# Patient Record
Sex: Male | Born: 1935
Health system: Southern US, Community
[De-identification: ages and names within clinical notes are randomized; demographics above are authoritative.]

## PROBLEM LIST (undated history)

## (undated) DIAGNOSIS — M1990 Unspecified osteoarthritis, unspecified site: Secondary | ICD-10-CM

## (undated) DIAGNOSIS — I2510 Atherosclerotic heart disease of native coronary artery without angina pectoris: Secondary | ICD-10-CM

## (undated) DIAGNOSIS — E785 Hyperlipidemia, unspecified: Secondary | ICD-10-CM

## (undated) DIAGNOSIS — H269 Unspecified cataract: Secondary | ICD-10-CM

## (undated) DIAGNOSIS — E041 Nontoxic single thyroid nodule: Secondary | ICD-10-CM

## (undated) DIAGNOSIS — K219 Gastro-esophageal reflux disease without esophagitis: Secondary | ICD-10-CM

## (undated) DIAGNOSIS — C61 Malignant neoplasm of prostate: Secondary | ICD-10-CM

## (undated) DIAGNOSIS — D649 Anemia, unspecified: Secondary | ICD-10-CM

## (undated) DIAGNOSIS — N289 Disorder of kidney and ureter, unspecified: Secondary | ICD-10-CM

## (undated) DIAGNOSIS — D519 Vitamin B12 deficiency anemia, unspecified: Secondary | ICD-10-CM

## (undated) HISTORY — DX: Atherosclerotic heart disease of native coronary artery without angina pectoris: I25.10

## (undated) HISTORY — DX: Unspecified cataract: H26.9

## (undated) HISTORY — DX: Vitamin B12 deficiency anemia, unspecified: D51.9

## (undated) HISTORY — DX: Hyperlipidemia, unspecified: E78.5

## (undated) HISTORY — DX: Unspecified osteoarthritis, unspecified site: M19.90

## (undated) HISTORY — DX: Malignant neoplasm of prostate: C61

## (undated) HISTORY — DX: Gastro-esophageal reflux disease without esophagitis: K21.9

## (undated) HISTORY — DX: Anemia, unspecified: D64.9

## (undated) HISTORY — PX: PROSTATE BIOPSY: SHX241

## (undated) HISTORY — DX: Nontoxic single thyroid nodule: E04.1

## (undated) HISTORY — DX: Disorder of kidney and ureter, unspecified: N28.9

---

## 2006-01-02 ENCOUNTER — Ambulatory Visit: Payer: Self-pay | Admitting: Urology

## 2006-01-05 ENCOUNTER — Ambulatory Visit: Payer: Self-pay | Admitting: Urology

## 2006-08-08 ENCOUNTER — Emergency Department: Payer: Self-pay | Admitting: Emergency Medicine

## 2011-07-23 ENCOUNTER — Emergency Department: Payer: Self-pay | Admitting: Emergency Medicine

## 2011-09-14 ENCOUNTER — Ambulatory Visit: Payer: Self-pay | Admitting: Internal Medicine

## 2014-04-08 ENCOUNTER — Ambulatory Visit: Payer: Self-pay | Admitting: Urology

## 2014-09-18 DIAGNOSIS — D518 Other vitamin B12 deficiency anemias: Secondary | ICD-10-CM | POA: Diagnosis not present

## 2014-09-18 DIAGNOSIS — H9193 Unspecified hearing loss, bilateral: Secondary | ICD-10-CM | POA: Diagnosis not present

## 2014-09-18 DIAGNOSIS — E782 Mixed hyperlipidemia: Secondary | ICD-10-CM | POA: Diagnosis not present

## 2014-09-18 DIAGNOSIS — C61 Malignant neoplasm of prostate: Secondary | ICD-10-CM | POA: Diagnosis not present

## 2014-09-18 DIAGNOSIS — J449 Chronic obstructive pulmonary disease, unspecified: Secondary | ICD-10-CM | POA: Diagnosis not present

## 2014-10-08 DIAGNOSIS — H40003 Preglaucoma, unspecified, bilateral: Secondary | ICD-10-CM | POA: Diagnosis not present

## 2014-10-25 ENCOUNTER — Emergency Department: Payer: Self-pay | Admitting: Emergency Medicine

## 2014-10-25 DIAGNOSIS — Z87891 Personal history of nicotine dependence: Secondary | ICD-10-CM | POA: Diagnosis not present

## 2014-10-25 DIAGNOSIS — R05 Cough: Secondary | ICD-10-CM | POA: Diagnosis not present

## 2014-10-25 DIAGNOSIS — J189 Pneumonia, unspecified organism: Secondary | ICD-10-CM | POA: Diagnosis not present

## 2014-11-02 ENCOUNTER — Ambulatory Visit: Payer: Self-pay | Admitting: Physician Assistant

## 2014-11-02 DIAGNOSIS — D518 Other vitamin B12 deficiency anemias: Secondary | ICD-10-CM | POA: Diagnosis not present

## 2014-11-02 DIAGNOSIS — D519 Vitamin B12 deficiency anemia, unspecified: Secondary | ICD-10-CM | POA: Diagnosis not present

## 2014-11-02 DIAGNOSIS — C61 Malignant neoplasm of prostate: Secondary | ICD-10-CM | POA: Diagnosis not present

## 2014-11-02 DIAGNOSIS — J449 Chronic obstructive pulmonary disease, unspecified: Secondary | ICD-10-CM | POA: Diagnosis not present

## 2014-11-02 DIAGNOSIS — R0602 Shortness of breath: Secondary | ICD-10-CM | POA: Diagnosis not present

## 2014-11-02 DIAGNOSIS — I959 Hypotension, unspecified: Secondary | ICD-10-CM | POA: Diagnosis not present

## 2014-11-02 DIAGNOSIS — J189 Pneumonia, unspecified organism: Secondary | ICD-10-CM | POA: Diagnosis not present

## 2014-11-04 ENCOUNTER — Emergency Department: Payer: Self-pay | Admitting: Emergency Medicine

## 2014-11-04 DIAGNOSIS — G8929 Other chronic pain: Secondary | ICD-10-CM | POA: Diagnosis not present

## 2014-11-04 DIAGNOSIS — R531 Weakness: Secondary | ICD-10-CM | POA: Diagnosis not present

## 2014-11-04 DIAGNOSIS — M549 Dorsalgia, unspecified: Secondary | ICD-10-CM | POA: Diagnosis not present

## 2014-11-04 DIAGNOSIS — Z87891 Personal history of nicotine dependence: Secondary | ICD-10-CM | POA: Diagnosis not present

## 2014-11-10 DIAGNOSIS — F329 Major depressive disorder, single episode, unspecified: Secondary | ICD-10-CM | POA: Diagnosis not present

## 2014-11-10 DIAGNOSIS — J189 Pneumonia, unspecified organism: Secondary | ICD-10-CM | POA: Diagnosis not present

## 2014-11-10 DIAGNOSIS — D519 Vitamin B12 deficiency anemia, unspecified: Secondary | ICD-10-CM | POA: Diagnosis not present

## 2014-11-10 DIAGNOSIS — N179 Acute kidney failure, unspecified: Secondary | ICD-10-CM | POA: Diagnosis not present

## 2014-11-10 DIAGNOSIS — I959 Hypotension, unspecified: Secondary | ICD-10-CM | POA: Diagnosis not present

## 2014-11-12 DIAGNOSIS — I959 Hypotension, unspecified: Secondary | ICD-10-CM | POA: Diagnosis not present

## 2014-11-12 DIAGNOSIS — D518 Other vitamin B12 deficiency anemias: Secondary | ICD-10-CM | POA: Diagnosis not present

## 2014-11-12 DIAGNOSIS — J189 Pneumonia, unspecified organism: Secondary | ICD-10-CM | POA: Diagnosis not present

## 2014-11-12 DIAGNOSIS — N179 Acute kidney failure, unspecified: Secondary | ICD-10-CM | POA: Diagnosis not present

## 2014-11-30 ENCOUNTER — Ambulatory Visit
Admit: 2014-11-30 | Disposition: A | Discharge status: Home / Self Care | Payer: Self-pay | Attending: Physician Assistant | Admitting: Physician Assistant

## 2014-11-30 DIAGNOSIS — Z8701 Personal history of pneumonia (recurrent): Secondary | ICD-10-CM | POA: Diagnosis not present

## 2014-11-30 DIAGNOSIS — R0602 Shortness of breath: Secondary | ICD-10-CM | POA: Diagnosis not present

## 2014-12-02 DIAGNOSIS — C61 Malignant neoplasm of prostate: Secondary | ICD-10-CM | POA: Diagnosis not present

## 2014-12-17 DIAGNOSIS — R972 Elevated prostate specific antigen [PSA]: Secondary | ICD-10-CM | POA: Diagnosis not present

## 2014-12-24 ENCOUNTER — Ambulatory Visit: Payer: Medicare Other

## 2014-12-24 ENCOUNTER — Encounter: Payer: Self-pay | Admitting: Hematology and Oncology

## 2014-12-24 ENCOUNTER — Inpatient Hospital Stay: Payer: Medicare Other | Attending: Hematology and Oncology | Admitting: Hematology and Oncology

## 2014-12-24 VITALS — BP 159/79 | HR 70 | Temp 97.4°F | Resp 18 | Ht 71.0 in | Wt 166.4 lb

## 2014-12-24 DIAGNOSIS — Z79818 Long term (current) use of other agents affecting estrogen receptors and estrogen levels: Secondary | ICD-10-CM

## 2014-12-24 DIAGNOSIS — R61 Generalized hyperhidrosis: Secondary | ICD-10-CM | POA: Diagnosis not present

## 2014-12-24 DIAGNOSIS — C61 Malignant neoplasm of prostate: Secondary | ICD-10-CM | POA: Diagnosis not present

## 2014-12-24 DIAGNOSIS — K219 Gastro-esophageal reflux disease without esophagitis: Secondary | ICD-10-CM

## 2014-12-24 DIAGNOSIS — M1990 Unspecified osteoarthritis, unspecified site: Secondary | ICD-10-CM | POA: Diagnosis not present

## 2014-12-24 DIAGNOSIS — R972 Elevated prostate specific antigen [PSA]: Secondary | ICD-10-CM | POA: Diagnosis not present

## 2014-12-24 DIAGNOSIS — R634 Abnormal weight loss: Secondary | ICD-10-CM | POA: Insufficient documentation

## 2014-12-24 DIAGNOSIS — Z87891 Personal history of nicotine dependence: Secondary | ICD-10-CM

## 2014-12-24 DIAGNOSIS — D649 Anemia, unspecified: Secondary | ICD-10-CM

## 2014-12-24 DIAGNOSIS — Z8051 Family history of malignant neoplasm of kidney: Secondary | ICD-10-CM

## 2014-12-24 HISTORY — DX: Anemia, unspecified: D64.9

## 2014-12-24 LAB — COMPREHENSIVE METABOLIC PANEL
ALT: 6 U/L — ABNORMAL LOW (ref 17–63)
AST: 14 U/L — ABNORMAL LOW (ref 15–41)
Albumin: 4 g/dL (ref 3.5–5.0)
Alkaline Phosphatase: 63 U/L (ref 38–126)
Anion gap: 8 (ref 5–15)
BUN: 13 mg/dL (ref 6–20)
CO2: 26 mmol/L (ref 22–32)
Calcium: 9.3 mg/dL (ref 8.9–10.3)
Chloride: 105 mmol/L (ref 101–111)
Creatinine, Ser: 0.96 mg/dL (ref 0.61–1.24)
GFR calc Af Amer: >60 mL/min (ref >60)
GFR calc non Af Amer: >60 mL/min (ref >60)
Glucose, Bld: 92 mg/dL (ref 65–99)
Potassium: 3.9 mmol/L (ref 3.5–5.1)
Sodium: 139 mmol/L (ref 135–145)
Total Bilirubin: 0.5 mg/dL (ref 0.3–1.2)
Total Protein: 7.2 g/dL (ref 6.5–8.1)

## 2014-12-24 LAB — IRON AND TIBC
Iron: 64 ug/dL (ref 45–182)
Saturation Ratios: 22 % (ref 17.9–39.5)
TIBC: 293 ug/dL (ref 250–450)
UIBC: 229 ug/dL

## 2014-12-24 LAB — CBC WITH DIFFERENTIAL/PLATELET
Basophils Absolute: 0.1 10*3/uL (ref 0–0.1)
Basophils Relative: 2 %
Eosinophils Absolute: 0.5 10*3/uL (ref 0–0.7)
Eosinophils Relative: 12 %
HCT: 36.1 % — ABNORMAL LOW (ref 40.0–52.0)
Hemoglobin: 12 g/dL — ABNORMAL LOW (ref 13.0–18.0)
Lymphocytes Relative: 32 %
Lymphs Abs: 1.2 10*3/uL (ref 1.0–3.6)
MCH: 31.9 pg (ref 26.0–34.0)
MCHC: 33.3 g/dL (ref 32.0–36.0)
MCV: 95.7 fL (ref 80.0–100.0)
Monocytes Absolute: 0.5 10*3/uL (ref 0.2–1.0)
Monocytes Relative: 13 %
Neutro Abs: 1.6 10*3/uL (ref 1.4–6.5)
Neutrophils Relative %: 41 %
Platelets: 192 10*3/uL (ref 150–440)
RBC: 3.77 MIL/uL — ABNORMAL LOW (ref 4.40–5.90)
RDW: 14.8 % — ABNORMAL HIGH (ref 11.5–14.5)
WBC: 3.8 10*3/uL (ref 3.8–10.6)

## 2014-12-24 LAB — TSH: TSH: 2.487 u[IU]/mL (ref 0.350–4.500)

## 2014-12-24 LAB — FOLATE: Folate: 11 ng/mL (ref >5.9)

## 2014-12-24 LAB — FERRITIN: Ferritin: 158 ng/mL (ref 24–336)

## 2014-12-24 NOTE — Progress Notes (Signed)
[facility] day:  12/24/2014  Chief Complaint: Charles Flores is an 81 y.o. male with prostate cancer who is referred in consultation by Dr. Rick Duff of [place].  HPI: The patient states that his prostate cancer was discovered approximately 20 years by an elevated PSA.  He underwent biopsy at [facility] (no report available).   He states that an "implant" was placed.  His PSA came down.  Approximately 4-5 years ago, his PS started going up again.    He states that he has seen Dr. Radford Pax 4-5 times.  Reports indicate T4N0 disease.  PSA was 774 on first presentation.  He was started on Lupron.  He receives an injection every 6 months (last 12/02/2014).   He denies any side effects.  He states that "it isn't working anymore".  PSA was 127.6 on 06/30/2014 and 168.3 on 12/02/2014.  Last bone scan on 04/08/2014 revealed no evidence of metastatic disease.  He denies any bone pain.  He denies any urinary symptoms.  He has a history of anemia.  He states that his diet is good.  He eats meat 2 times a week.  He denies any melena or hematochezia.  He denies any hematuria.  Last colonoscopy was more than 10 years ago.  Past Medical History  Diagnosis Date  . Prostate cancer   . Arthritis   . Cataract   . GERD (gastroesophageal reflux disease)   . Anemia 12/24/2014    History reviewed. No pertinent past surgical history.  Family History  Problem Relation Age of Onset  . Cancer Mother   . Breast cancer Mother   . Kidney disease Father   . Diabetes Sister     Social History:  reports that he has quit smoking. His smoking use included Cigarettes. He has a 7.5 pack-year smoking history. He has never used smokeless tobacco. He reports that he does not drink alcohol or use illicit drugs.  Allergies: No Known Allergies  No current outpatient prescriptions on file.   No current facility-administered medications  for this visit.   ROS  GENERAL:  Feels good.  Active.  No fevers.  Some sweats at night.  Lost 20# in a year. PERFORMANCE STATUS (ECOG):  1 HEENT:  No visual changes, runny nose, sore throat, mouth sores or tenderness. Lungs: No shortness of breath or cough.  No hemoptysis. Cardiac:  No chest pain, palpitations, orthopnea, or PND. GI:  No nausea, vomiting, diarrhea, constipation, melena or hematochezia. GU:  No urgency, frequency, dysuria, or hematuria. Musculoskeletal:  No back pain.  No joint pain.  No muscle tenderness. Extremities:  No pain or swelling. Skin:  No rashes or skin changes. Neuro:  No headache, numbness or weakness.  Sometimes balance or coordination issues. Endocrine:  No diabetes, thyroid issues, hot flashes or night sweats. Psych:  No mood changes, depression or anxiety. Pain:  No focal pain. Review of systems:  All other systems reviewed and found to be negative.  Physical Exam  Blood pressure 159/79, pulse 70, temperature 97.4 F (36.3 C), temperature source Oral, resp. rate 18, height '5\' 11"'  (1.803 m), weight 166 lb 7.2 oz (75.5 kg), SpO2 100 %.  GENERAL:  Well developed, well nourished gentleman sitting comfortably in the exam room in no acute distress. MENTAL STATUS:  Alert and oriented to person, place and time. HEAD:  Wearing a gray hat.  Alopecia.  Normocephalic, atraumatic, face symmetric, no Cushingoid features. EYES:  Brown eyes (prominent).  Pupils equal round and reactive to light and accomodation.  No conjunctivitis or scleral icterus. ENT:  Oropharynx clear without lesion.  Tongue normal. Mucous membranes moist. Dentures. RESPIRATORY:  Clear to auscultation without rales, wheezes or rhonchi. CARDIOVASCULAR:  Regular rate and rhythm without murmur, rub or gallop. ABDOMEN:  Soft, non-tender, with active bowel sounds, and no hepatosplenomegaly.  No masses. BACK:  No tenderness on percussion of the back or rib cage. SKIN:  No rashes, ulcers or  lesions. EXTREMITIES: Trace lower extremity edema.  No skin discoloration or tenderness.  No palpable cords. LYMPH NODES: No palpable cervical, supraclavicular, axillary or inguinal adenopathy  NEUROLOGICAL: Unremarkable. PSYCH:  Appropriate.  Appointment on 12/24/2014  Component Date Value Ref Range Status  . WBC 12/24/2014 3.8  3.8 - 10.6 K/uL Final  . RBC 12/24/2014 3.77* 4.40 - 5.90 MIL/uL Final  . Hemoglobin 12/24/2014 12.0* 13.0 - 18.0 g/dL Final  . HCT 12/24/2014 36.1* 40.0 - 52.0 % Final  . MCV 12/24/2014 95.7  80.0 - 100.0 fL Final  . MCH 12/24/2014 31.9  26.0 - 34.0 pg Final  . MCHC 12/24/2014 33.3  32.0 - 36.0 g/dL Final  . RDW 12/24/2014 14.8* 11.5 - 14.5 % Final  . Platelets 12/24/2014 192  150 - 440 K/uL Final  . Neutrophils Relative % 12/24/2014 41   Final  . Neutro Abs 12/24/2014 1.6  1.4 - 6.5 K/uL Final  . Lymphocytes Relative 12/24/2014 32   Final  . Lymphs Abs 12/24/2014 1.2  1.0 - 3.6 K/uL Final  . Monocytes Relative 12/24/2014 13   Final  . Monocytes Absolute 12/24/2014 0.5  0.2 - 1.0 K/uL Final  . Eosinophils Relative 12/24/2014 12   Final  . Eosinophils Absolute 12/24/2014 0.5  0 - 0.7 K/uL Final  . Basophils Relative 12/24/2014 2   Final  . Basophils Absolute 12/24/2014 0.1  0 - 0.1 K/uL Final  . Sodium 12/24/2014 139  135 - 145 mmol/L Final  . Potassium 12/24/2014 3.9  3.5 - 5.1 mmol/L Final  . Chloride 12/24/2014 105  101 - 111 mmol/L Final  . CO2 12/24/2014 26  22 - 32 mmol/L Final  . Glucose, Bld 12/24/2014 92  65 - 99 mg/dL Final  . BUN 12/24/2014 13  6 - 20 mg/dL Final  . Creatinine, Ser 12/24/2014 0.96  0.61 - 1.24 mg/dL Final  . Calcium 12/24/2014 9.3  8.9 - 10.3 mg/dL Final  . Total Protein 12/24/2014 7.2  6.5 - 8.1 g/dL Final  . Albumin 12/24/2014 4.0  3.5 - 5.0 g/dL Final  . AST 12/24/2014 14* 15 - 41 U/L Final  . ALT 12/24/2014 6* 17 - 63 U/L Final  . Alkaline Phosphatase 12/24/2014 63  38 - 126 U/L Final  . Total Bilirubin 12/24/2014 0.5   0.3 - 1.2 mg/dL Final  . GFR calc non Af Amer 12/24/2014 >60  >60 mL/min Final  . GFR calc Af Amer 12/24/2014 >60  >60 mL/min Final   Comment: (NOTE) The eGFR has been calculated using the CKD EPI equation. This calculation has not been validated in all clinical situations. eGFR's persistently <60 mL/min signify possible Chronic Kidney Disease.   . Anion gap 12/24/2014 8  5 - 15 Final  . Ferritin 12/24/2014 158  24 - 336 ng/mL Final  . Iron 12/24/2014 64  45 - 182 ug/dL Final  . TIBC 12/24/2014 293  250 - 450 ug/dL Final  . Saturation Ratios 12/24/2014 22  17.9 -  39.5 % Final  . UIBC 12/24/2014 229   Final  . Folate 12/24/2014 11.0  >5.9 ng/mL Final  . TSH 12/24/2014 2.487  0.350 - 4.500 uIU/mL Final    Assessment:  The patient is a 81 year old African American gentleman with prostate cancer and a rising PSA on Lupron.  Prostate cancer was discovered approximately 20 years secondary to an elevated PSA.  He underwent biopsy at [facility] (no report available).     Initial PSA was 774.  PSA was 127.6 on 06/30/2014 and 168.3 on 12/02/2014.  Bone scan on 04/08/2014 revealed no evidence of metastatic disease.  He denies any bone pain.  He denies any urinary symptoms.  He has a history of anemia.  He states that his diet is good.  He eats meat 2 times a week.  He denies any melena or hematochezia.  He denies any hematuria.  Last colonoscopy was more than 10 years ago.  Plan:   1)  Review diagnosis of prostate cancer, rising PSA, typical pattern of spread, and multiple treatment options.  Discuss bone scan as it has been greater than 6 months since last imaging.  Discuss initiation of anti-androgen (Casodex). Side effects reviewed in detail.  Information sheets provided. 2)  Discuss anemia work-up.  If iron deficiency documented, will need referral to GI as diet good. 3)  Labs today:  CBC with diff, CMP, PSA, ferritin, iron studies, B12, folate, TSH. 4)  Information  provided on Casodex. 5)  Schedule bone scan- r/o metastatic disease. 6)  Anticipate continuation of Lupron with Dr. Elnoria Howard. 7)  RTC in 1 week for review of work-up and initiation of Casodex.  Lequita Asal, MD  12/24/2014, 7:57 PM

## 2014-12-25 ENCOUNTER — Encounter: Payer: Self-pay | Admitting: Hematology and Oncology

## 2014-12-25 LAB — VITAMIN B12: Vitamin B-12: 169 pg/mL — ABNORMAL LOW (ref 180–914)

## 2014-12-25 NOTE — Progress Notes (Signed)
Pre auth request sent to [employer]

## 2014-12-29 ENCOUNTER — Ambulatory Visit
Admission: RE | Admit: 2014-12-29 | Discharge: 2014-12-29 | Disposition: A | Discharge status: Home / Self Care | Payer: Medicare Other | Source: Ambulatory Visit | Attending: Hematology and Oncology | Admitting: Hematology and Oncology

## 2014-12-29 DIAGNOSIS — C61 Malignant neoplasm of prostate: Secondary | ICD-10-CM

## 2014-12-29 DIAGNOSIS — M7989 Other specified soft tissue disorders: Secondary | ICD-10-CM | POA: Diagnosis not present

## 2014-12-29 DIAGNOSIS — R972 Elevated prostate specific antigen [PSA]: Secondary | ICD-10-CM | POA: Diagnosis not present

## 2014-12-29 MED ORDER — TECHNETIUM TC 99M MEDRONATE IV KIT
25.0000 | PACK | Freq: Once | INTRAVENOUS | Status: AC | PRN
  Administered 2014-12-29: 22.27 via INTRAVENOUS

## 2015-01-01 ENCOUNTER — Inpatient Hospital Stay (HOSPITAL_BASED_OUTPATIENT_CLINIC_OR_DEPARTMENT_OTHER): Payer: Medicare Other | Admitting: Hematology and Oncology

## 2015-01-01 ENCOUNTER — Encounter: Payer: Self-pay | Admitting: Hematology and Oncology

## 2015-01-01 ENCOUNTER — Inpatient Hospital Stay: Payer: Medicare Other

## 2015-01-01 VITALS — BP 131/78 | HR 79 | Temp 98.3°F | Ht 71.0 in | Wt 164.9 lb

## 2015-01-01 DIAGNOSIS — M1990 Unspecified osteoarthritis, unspecified site: Secondary | ICD-10-CM | POA: Diagnosis not present

## 2015-01-01 DIAGNOSIS — R634 Abnormal weight loss: Secondary | ICD-10-CM | POA: Diagnosis not present

## 2015-01-01 DIAGNOSIS — Z87891 Personal history of nicotine dependence: Secondary | ICD-10-CM | POA: Diagnosis not present

## 2015-01-01 DIAGNOSIS — D519 Vitamin B12 deficiency anemia, unspecified: Secondary | ICD-10-CM

## 2015-01-01 DIAGNOSIS — D649 Anemia, unspecified: Secondary | ICD-10-CM

## 2015-01-01 DIAGNOSIS — K219 Gastro-esophageal reflux disease without esophagitis: Secondary | ICD-10-CM | POA: Diagnosis not present

## 2015-01-01 DIAGNOSIS — Z79818 Long term (current) use of other agents affecting estrogen receptors and estrogen levels: Secondary | ICD-10-CM

## 2015-01-01 DIAGNOSIS — Z79899 Other long term (current) drug therapy: Secondary | ICD-10-CM

## 2015-01-01 DIAGNOSIS — I10 Essential (primary) hypertension: Secondary | ICD-10-CM

## 2015-01-01 DIAGNOSIS — R61 Generalized hyperhidrosis: Secondary | ICD-10-CM | POA: Diagnosis not present

## 2015-01-01 DIAGNOSIS — R972 Elevated prostate specific antigen [PSA]: Secondary | ICD-10-CM | POA: Diagnosis not present

## 2015-01-01 DIAGNOSIS — I2510 Atherosclerotic heart disease of native coronary artery without angina pectoris: Secondary | ICD-10-CM

## 2015-01-01 DIAGNOSIS — C61 Malignant neoplasm of prostate: Secondary | ICD-10-CM | POA: Diagnosis not present

## 2015-01-01 DIAGNOSIS — E785 Hyperlipidemia, unspecified: Secondary | ICD-10-CM

## 2015-01-01 HISTORY — DX: Vitamin B12 deficiency anemia, unspecified: D51.9

## 2015-01-01 MED ORDER — BICALUTAMIDE 50 MG PO TABS: 50.0000 mg | ORAL_TABLET | Freq: Every day | ORAL | Status: DC

## 2015-01-01 NOTE — Progress Notes (Signed)
Pt here today for follow up regarding prostate cancer and anemia; offers no complaints today

## 2015-01-05 DIAGNOSIS — N179 Acute kidney failure, unspecified: Secondary | ICD-10-CM | POA: Diagnosis not present

## 2015-01-05 DIAGNOSIS — C61 Malignant neoplasm of prostate: Secondary | ICD-10-CM | POA: Diagnosis not present

## 2015-01-05 DIAGNOSIS — D519 Vitamin B12 deficiency anemia, unspecified: Secondary | ICD-10-CM | POA: Diagnosis not present

## 2015-01-05 DIAGNOSIS — J189 Pneumonia, unspecified organism: Secondary | ICD-10-CM | POA: Diagnosis not present

## 2015-01-05 DIAGNOSIS — J439 Emphysema, unspecified: Secondary | ICD-10-CM | POA: Diagnosis not present

## 2015-02-01 ENCOUNTER — Inpatient Hospital Stay (HOSPITAL_BASED_OUTPATIENT_CLINIC_OR_DEPARTMENT_OTHER): Payer: Medicare Other | Admitting: Hematology and Oncology

## 2015-02-01 ENCOUNTER — Ambulatory Visit: Payer: Medicare Other

## 2015-02-01 ENCOUNTER — Encounter: Payer: Self-pay | Admitting: Hematology and Oncology

## 2015-02-01 ENCOUNTER — Inpatient Hospital Stay: Payer: Medicare Other | Attending: Hematology and Oncology

## 2015-02-01 VITALS — BP 121/79 | HR 79 | Temp 97.5°F | Resp 16 | Wt 159.8 lb

## 2015-02-01 DIAGNOSIS — Z87891 Personal history of nicotine dependence: Secondary | ICD-10-CM | POA: Diagnosis not present

## 2015-02-01 DIAGNOSIS — C61 Malignant neoplasm of prostate: Secondary | ICD-10-CM | POA: Insufficient documentation

## 2015-02-01 DIAGNOSIS — K219 Gastro-esophageal reflux disease without esophagitis: Secondary | ICD-10-CM

## 2015-02-01 DIAGNOSIS — Z79899 Other long term (current) drug therapy: Secondary | ICD-10-CM | POA: Diagnosis not present

## 2015-02-01 DIAGNOSIS — M1990 Unspecified osteoarthritis, unspecified site: Secondary | ICD-10-CM | POA: Insufficient documentation

## 2015-02-01 DIAGNOSIS — R232 Flushing: Secondary | ICD-10-CM | POA: Insufficient documentation

## 2015-02-01 DIAGNOSIS — I2510 Atherosclerotic heart disease of native coronary artery without angina pectoris: Secondary | ICD-10-CM | POA: Insufficient documentation

## 2015-02-01 DIAGNOSIS — D519 Vitamin B12 deficiency anemia, unspecified: Secondary | ICD-10-CM | POA: Diagnosis not present

## 2015-02-01 DIAGNOSIS — E785 Hyperlipidemia, unspecified: Secondary | ICD-10-CM | POA: Diagnosis not present

## 2015-02-01 DIAGNOSIS — R5383 Other fatigue: Secondary | ICD-10-CM

## 2015-02-01 DIAGNOSIS — Z79818 Long term (current) use of other agents affecting estrogen receptors and estrogen levels: Secondary | ICD-10-CM

## 2015-02-01 LAB — COMPREHENSIVE METABOLIC PANEL
ALT: 8 U/L — ABNORMAL LOW (ref 17–63)
AST: 14 U/L — ABNORMAL LOW (ref 15–41)
Albumin: 4.1 g/dL (ref 3.5–5.0)
Alkaline Phosphatase: 66 U/L (ref 38–126)
Anion gap: 5 (ref 5–15)
BUN: 24 mg/dL — ABNORMAL HIGH (ref 6–20)
CO2: 24 mmol/L (ref 22–32)
Calcium: 9.1 mg/dL (ref 8.9–10.3)
Chloride: 107 mmol/L (ref 101–111)
Creatinine, Ser: 1.27 mg/dL — ABNORMAL HIGH (ref 0.61–1.24)
GFR calc Af Amer: 57 mL/min — ABNORMAL LOW (ref >60)
GFR calc non Af Amer: 49 mL/min — ABNORMAL LOW (ref >60)
Glucose, Bld: 97 mg/dL (ref 65–99)
Potassium: 3.9 mmol/L (ref 3.5–5.1)
Sodium: 136 mmol/L (ref 135–145)
Total Bilirubin: 0.6 mg/dL (ref 0.3–1.2)
Total Protein: 7.6 g/dL (ref 6.5–8.1)

## 2015-02-01 LAB — PSA: PSA: 63.32 ng/mL — ABNORMAL HIGH (ref 0.00–4.00)

## 2015-02-01 MED ORDER — CYANOCOBALAMIN 1000 MCG/ML IJ SOLN
1000.0000 ug | Freq: Once | INTRAMUSCULAR | Status: AC
  Administered 2015-02-01: 1000 ug via INTRAMUSCULAR

## 2015-02-01 NOTE — Progress Notes (Signed)
[facility] day:  02/01/2015  Chief Complaint: Charles Flores is an 81 y.o. male with prostate cancer on total androgen blockade and recently diagnosed B12 deficiency who is seen for 1 month assessment after intiation of Casodex.  HPI: The patient was last seen in the medical oncology clinic on 01/01/2015.  At that time, interval bone scan was reviewed. There is no evidence of metastatic disease. Workup of his anemia and B12 deficiency. He was started on Casodex.  He was to start B12 weekly 6 before monthly injections.  Symptomatically, the patient notes that the "pill" (Casodex) makes him drowsy. His hot flashes have not changed. He denies any sweats. Overall he feels pretty good.    Past Medical History  Diagnosis Date  . Prostate cancer   . Arthritis   . Cataract   . GERD (gastroesophageal reflux disease)   . Anemia 12/24/2014  . Hyperlipidemia   . Hyperlipidemia   . Coronary artery disease   . B12 deficiency anemia 01/01/2015    Past Surgical History  Procedure Laterality Date  . Prostate biopsy      Family History  Problem Relation Age of Onset  . Cancer Mother   . Breast cancer Mother   . Kidney disease Father   . Diabetes Sister   . Stroke Sister   . Gastric cancer Sister     Social History:  reports that he has quit smoking. His smoking use included Cigarettes. He has a 7.5 pack-year smoking history. He has never used smokeless tobacco. He reports that he does not drink alcohol or use illicit drugs.  The patient is alone today.  Allergies: No Known Allergies  Current Medications: Current Outpatient Prescriptions  Medication Sig Dispense Refill  . bicalutamide (CASODEX) 50 MG tablet Take 1 tablet (50 mg total) by mouth daily. 30 tablet 3   No current facility-administered medications for this visit.    Review of Systems:  GENERAL:  Feels pretty good.  Active.  No fevers or sweats.  Weight down 5 pounds. PERFORMANCE  STATUS (ECOG):  1 HEENT:  No visual changes, runny nose, sore throat, mouth sores or tenderness. Lungs: No shortness of breath or cough.  No hemoptysis. Cardiac:  No chest pain, palpitations, orthopnea, or PND. GI:  No nausea, vomiting, diarrhea, constipation, melena or hematochezia. GU:  No urgency, frequency, dysuria, or hematuria. Musculoskeletal:  No back pain.  No joint pain.  No muscle tenderness. Extremities:  No pain or swelling. Skin:  No rashes or skin changes. Neuro:  No headache, numbness or weakness, balance or coordination issues. Endocrine:  Mild hot flashes (unchanged). No diabetes, thyroid issues, or night sweats. Psych:  No mood changes, depression or anxiety. Pain:  No focal pain. Review of systems:  All other systems reviewed and found to be negative.   Physical Exam: Blood pressure 121/79, pulse 79, temperature 97.5 F (36.4 C), temperature source Tympanic, resp. rate 16, weight 159 lb 13.3 oz (72.5 kg), SpO2 100 %. GENERAL:  Well developed, well nourished, elderly gentleman sitting comfortably in the exam room in no acute distress. MENTAL STATUS:  Alert and oriented to person, place and time. HEAD:  Alopecia.  Normocephalic, atraumatic, face symmetric, no Cushingoid features. EYES:  Prominent eyes, alf closed.  Pupils equal round and reactive to light and accomodation.  No conjunctivitis or scleral icterus. ENT:  Oropharynx clear without lesion.  Dentures.  Tongue normal. Mucous membranes moist.  RESPIRATORY:  Clear to auscultation without rales,  wheezes or rhonchi. CARDIOVASCULAR:  Regular rate and rhythm without murmur, rub or gallop. ABDOMEN:  Soft, non-tender, with active bowel sounds, and no hepatosplenomegaly.  No masses. SKIN:  No rashes, ulcers or lesions. EXTREMITIES: No edema, no skin discoloration or tenderness.  No palpable cords. LYMPH NODES: No palpable cervical, supraclavicular, axillary or inguinal adenopathy  NEUROLOGICAL: Unremarkable. PSYCH:   Appropriate.   Appointment on 02/01/2015  Component Date Value Ref Range Status  . Sodium 02/01/2015 136  135 - 145 mmol/L Final  . Potassium 02/01/2015 3.9  3.5 - 5.1 mmol/L Final  . Chloride 02/01/2015 107  101 - 111 mmol/L Final  . CO2 02/01/2015 24  22 - 32 mmol/L Final  . Glucose, Bld 02/01/2015 97  65 - 99 mg/dL Final  . BUN 02/01/2015 24* 6 - 20 mg/dL Final  . Creatinine, Ser 02/01/2015 1.27* 0.61 - 1.24 mg/dL Final  . Calcium 02/01/2015 9.1  8.9 - 10.3 mg/dL Final  . Total Protein 02/01/2015 7.6  6.5 - 8.1 g/dL Final  . Albumin 02/01/2015 4.1  3.5 - 5.0 g/dL Final  . AST 02/01/2015 14* 15 - 41 U/L Final  . ALT 02/01/2015 8* 17 - 63 U/L Final  . Alkaline Phosphatase 02/01/2015 66  38 - 126 U/L Final  . Total Bilirubin 02/01/2015 0.6  0.3 - 1.2 mg/dL Final  . GFR calc non Af Amer 02/01/2015 49* >60 mL/min Final  . GFR calc Af Amer 02/01/2015 57* >60 mL/min Final   Comment: (NOTE) The eGFR has been calculated using the CKD EPI equation. This calculation has not been validated in all clinical situations. eGFR's persistently <60 mL/min signify possible Chronic Kidney Disease.   Georgiann Hahn gap 02/01/2015 5  5 - 15 Final    Assessment:  Charles Flores is an 81 y.o. male African American gentleman with prostate cancer and a rising PSA on Lupron. Prostate cancer was discovered approximately 20 years secondary to an elevated PSA. He underwent biopsy at [facility] (no report available).   Initial PSA was 774. PSA was 127.6 on 06/30/2014 and 168.3 on 12/02/2014. Bone scan on 04/08/2014 revealed no evidence of metastatic disease. Bone scan on 12/29/2014 revealed degenerative uptake in the right rotator cuff and bilateral knees. There was no evidence of metastatic disease.   He has a normocytic anemia. Work-up on 12/24/2014 revealed a B12 deficiency. B12 was 169. Normal studies included a CMP, ferritin, iron studies, folate, and TSH. Diet is good. He eats meat  2 times a week. He denies any melena, hematochezia, or hematuria. Last colonoscopy was more than 10 years ago.  Symptomatically, he denies any complaint. Exam is unremarkable.  PSA is 63.32 (improved).  Plan: 1. Labs today:  CMP, PSA. 2. Continue Casodex. 3. B12 today and weekly x 5 then monthly. 4. RTC in 1 month for MD assess, labs (CBC with diff, B12, PSA, CMP).  Lequita Asal, MD  02/01/2015, 11:14 AM

## 2015-02-08 ENCOUNTER — Inpatient Hospital Stay: Payer: Medicare Other

## 2015-02-08 DIAGNOSIS — R5383 Other fatigue: Secondary | ICD-10-CM | POA: Diagnosis not present

## 2015-02-08 DIAGNOSIS — E785 Hyperlipidemia, unspecified: Secondary | ICD-10-CM | POA: Diagnosis not present

## 2015-02-08 DIAGNOSIS — C61 Malignant neoplasm of prostate: Secondary | ICD-10-CM | POA: Diagnosis not present

## 2015-02-08 DIAGNOSIS — Z79818 Long term (current) use of other agents affecting estrogen receptors and estrogen levels: Secondary | ICD-10-CM | POA: Diagnosis not present

## 2015-02-08 DIAGNOSIS — Z79899 Other long term (current) drug therapy: Secondary | ICD-10-CM | POA: Diagnosis not present

## 2015-02-08 DIAGNOSIS — D519 Vitamin B12 deficiency anemia, unspecified: Secondary | ICD-10-CM

## 2015-02-08 DIAGNOSIS — K219 Gastro-esophageal reflux disease without esophagitis: Secondary | ICD-10-CM | POA: Diagnosis not present

## 2015-02-08 DIAGNOSIS — I2510 Atherosclerotic heart disease of native coronary artery without angina pectoris: Secondary | ICD-10-CM | POA: Diagnosis not present

## 2015-02-08 DIAGNOSIS — Z87891 Personal history of nicotine dependence: Secondary | ICD-10-CM | POA: Diagnosis not present

## 2015-02-08 DIAGNOSIS — R232 Flushing: Secondary | ICD-10-CM | POA: Diagnosis not present

## 2015-02-08 DIAGNOSIS — M1990 Unspecified osteoarthritis, unspecified site: Secondary | ICD-10-CM | POA: Diagnosis not present

## 2015-02-08 MED ORDER — CYANOCOBALAMIN 1000 MCG/ML IJ SOLN
1000.0000 ug | Freq: Once | INTRAMUSCULAR | Status: AC
  Administered 2015-02-08: 1000 ug via INTRAMUSCULAR
  Filled 2015-02-08: qty 1

## 2015-02-15 ENCOUNTER — Inpatient Hospital Stay: Payer: Medicare Other

## 2015-02-15 DIAGNOSIS — Z87891 Personal history of nicotine dependence: Secondary | ICD-10-CM | POA: Diagnosis not present

## 2015-02-15 DIAGNOSIS — Z79818 Long term (current) use of other agents affecting estrogen receptors and estrogen levels: Secondary | ICD-10-CM | POA: Diagnosis not present

## 2015-02-15 DIAGNOSIS — I2510 Atherosclerotic heart disease of native coronary artery without angina pectoris: Secondary | ICD-10-CM | POA: Diagnosis not present

## 2015-02-15 DIAGNOSIS — K219 Gastro-esophageal reflux disease without esophagitis: Secondary | ICD-10-CM | POA: Diagnosis not present

## 2015-02-15 DIAGNOSIS — E785 Hyperlipidemia, unspecified: Secondary | ICD-10-CM | POA: Diagnosis not present

## 2015-02-15 DIAGNOSIS — Z79899 Other long term (current) drug therapy: Secondary | ICD-10-CM | POA: Diagnosis not present

## 2015-02-15 DIAGNOSIS — D519 Vitamin B12 deficiency anemia, unspecified: Secondary | ICD-10-CM

## 2015-02-15 DIAGNOSIS — M1990 Unspecified osteoarthritis, unspecified site: Secondary | ICD-10-CM | POA: Diagnosis not present

## 2015-02-15 DIAGNOSIS — C61 Malignant neoplasm of prostate: Secondary | ICD-10-CM | POA: Diagnosis not present

## 2015-02-15 DIAGNOSIS — R232 Flushing: Secondary | ICD-10-CM | POA: Diagnosis not present

## 2015-02-15 DIAGNOSIS — R5383 Other fatigue: Secondary | ICD-10-CM | POA: Diagnosis not present

## 2015-02-15 MED ORDER — CYANOCOBALAMIN 1000 MCG/ML IJ SOLN
1000.0000 ug | Freq: Once | INTRAMUSCULAR | Status: AC
  Administered 2015-02-15: 1000 ug via INTRAMUSCULAR
  Filled 2015-02-15: qty 1

## 2015-02-23 ENCOUNTER — Inpatient Hospital Stay: Payer: Medicare Other | Attending: Oncology

## 2015-02-23 DIAGNOSIS — D510 Vitamin B12 deficiency anemia due to intrinsic factor deficiency: Secondary | ICD-10-CM | POA: Diagnosis not present

## 2015-02-23 DIAGNOSIS — E785 Hyperlipidemia, unspecified: Secondary | ICD-10-CM | POA: Diagnosis not present

## 2015-02-23 DIAGNOSIS — C61 Malignant neoplasm of prostate: Secondary | ICD-10-CM | POA: Diagnosis not present

## 2015-02-23 DIAGNOSIS — Z8051 Family history of malignant neoplasm of kidney: Secondary | ICD-10-CM | POA: Insufficient documentation

## 2015-02-23 DIAGNOSIS — I2510 Atherosclerotic heart disease of native coronary artery without angina pectoris: Secondary | ICD-10-CM | POA: Diagnosis not present

## 2015-02-23 DIAGNOSIS — K219 Gastro-esophageal reflux disease without esophagitis: Secondary | ICD-10-CM | POA: Insufficient documentation

## 2015-02-23 DIAGNOSIS — Z79818 Long term (current) use of other agents affecting estrogen receptors and estrogen levels: Secondary | ICD-10-CM | POA: Insufficient documentation

## 2015-02-23 DIAGNOSIS — M1990 Unspecified osteoarthritis, unspecified site: Secondary | ICD-10-CM | POA: Insufficient documentation

## 2015-02-23 DIAGNOSIS — R972 Elevated prostate specific antigen [PSA]: Secondary | ICD-10-CM | POA: Insufficient documentation

## 2015-02-23 DIAGNOSIS — Z803 Family history of malignant neoplasm of breast: Secondary | ICD-10-CM | POA: Insufficient documentation

## 2015-02-23 DIAGNOSIS — Z87891 Personal history of nicotine dependence: Secondary | ICD-10-CM | POA: Insufficient documentation

## 2015-02-23 DIAGNOSIS — D519 Vitamin B12 deficiency anemia, unspecified: Secondary | ICD-10-CM

## 2015-02-23 MED ORDER — CYANOCOBALAMIN 1000 MCG/ML IJ SOLN
1000.0000 ug | Freq: Once | INTRAMUSCULAR | Status: AC
  Administered 2015-02-23: 1000 ug via INTRAMUSCULAR
  Filled 2015-02-23: qty 1

## 2015-03-03 ENCOUNTER — Inpatient Hospital Stay (HOSPITAL_BASED_OUTPATIENT_CLINIC_OR_DEPARTMENT_OTHER): Payer: Medicare Other | Admitting: Oncology

## 2015-03-03 ENCOUNTER — Inpatient Hospital Stay: Payer: Medicare Other

## 2015-03-03 VITALS — BP 123/76 | HR 76 | Temp 96.0°F | Resp 16 | Wt 161.2 lb

## 2015-03-03 DIAGNOSIS — D519 Vitamin B12 deficiency anemia, unspecified: Secondary | ICD-10-CM

## 2015-03-03 DIAGNOSIS — Z87891 Personal history of nicotine dependence: Secondary | ICD-10-CM | POA: Diagnosis not present

## 2015-03-03 DIAGNOSIS — K219 Gastro-esophageal reflux disease without esophagitis: Secondary | ICD-10-CM

## 2015-03-03 DIAGNOSIS — E785 Hyperlipidemia, unspecified: Secondary | ICD-10-CM | POA: Diagnosis not present

## 2015-03-03 DIAGNOSIS — D510 Vitamin B12 deficiency anemia due to intrinsic factor deficiency: Secondary | ICD-10-CM

## 2015-03-03 DIAGNOSIS — Z79818 Long term (current) use of other agents affecting estrogen receptors and estrogen levels: Secondary | ICD-10-CM | POA: Diagnosis not present

## 2015-03-03 DIAGNOSIS — R972 Elevated prostate specific antigen [PSA]: Secondary | ICD-10-CM

## 2015-03-03 DIAGNOSIS — M1990 Unspecified osteoarthritis, unspecified site: Secondary | ICD-10-CM | POA: Diagnosis not present

## 2015-03-03 DIAGNOSIS — Z803 Family history of malignant neoplasm of breast: Secondary | ICD-10-CM

## 2015-03-03 DIAGNOSIS — C61 Malignant neoplasm of prostate: Secondary | ICD-10-CM

## 2015-03-03 DIAGNOSIS — Z8051 Family history of malignant neoplasm of kidney: Secondary | ICD-10-CM | POA: Diagnosis not present

## 2015-03-03 DIAGNOSIS — I2510 Atherosclerotic heart disease of native coronary artery without angina pectoris: Secondary | ICD-10-CM

## 2015-03-03 LAB — VITAMIN B12: Vitamin B-12: 768 pg/mL (ref 180–914)

## 2015-03-03 LAB — COMPREHENSIVE METABOLIC PANEL
ALT: 7 U/L — ABNORMAL LOW (ref 17–63)
AST: 13 U/L — ABNORMAL LOW (ref 15–41)
Albumin: 4 g/dL (ref 3.5–5.0)
Alkaline Phosphatase: 68 U/L (ref 38–126)
Anion gap: 6 (ref 5–15)
BUN: 17 mg/dL (ref 6–20)
CO2: 26 mmol/L (ref 22–32)
Calcium: 8.9 mg/dL (ref 8.9–10.3)
Chloride: 103 mmol/L (ref 101–111)
Creatinine, Ser: 1.29 mg/dL — ABNORMAL HIGH (ref 0.61–1.24)
GFR calc Af Amer: 56 mL/min — ABNORMAL LOW (ref >60)
GFR calc non Af Amer: 48 mL/min — ABNORMAL LOW (ref >60)
Glucose, Bld: 94 mg/dL (ref 65–99)
Potassium: 4.2 mmol/L (ref 3.5–5.1)
Sodium: 135 mmol/L (ref 135–145)
Total Bilirubin: 0.7 mg/dL (ref 0.3–1.2)
Total Protein: 7.4 g/dL (ref 6.5–8.1)

## 2015-03-03 LAB — PSA: PSA: 40.31 ng/mL — ABNORMAL HIGH (ref 0.00–4.00)

## 2015-03-03 LAB — CBC WITH DIFFERENTIAL/PLATELET
Basophils Absolute: 0.1 10*3/uL (ref 0–0.1)
Basophils Relative: 1 %
Eosinophils Absolute: 0.5 10*3/uL (ref 0–0.7)
Eosinophils Relative: 12 %
HCT: 36 % — ABNORMAL LOW (ref 40.0–52.0)
Hemoglobin: 11.9 g/dL — ABNORMAL LOW (ref 13.0–18.0)
Lymphocytes Relative: 27 %
Lymphs Abs: 1.2 10*3/uL (ref 1.0–3.6)
MCH: 31.6 pg (ref 26.0–34.0)
MCHC: 33.2 g/dL (ref 32.0–36.0)
MCV: 95.2 fL (ref 80.0–100.0)
Monocytes Absolute: 0.5 10*3/uL (ref 0.2–1.0)
Monocytes Relative: 11 %
Neutro Abs: 2.1 10*3/uL (ref 1.4–6.5)
Neutrophils Relative %: 49 %
Platelets: 196 10*3/uL (ref 150–440)
RBC: 3.78 MIL/uL — ABNORMAL LOW (ref 4.40–5.90)
RDW: 14.1 % (ref 11.5–14.5)
WBC: 4.3 10*3/uL (ref 3.8–10.6)

## 2015-03-03 MED ORDER — CYANOCOBALAMIN 1000 MCG/ML IJ SOLN
1000.0000 ug | Freq: Once | INTRAMUSCULAR | Status: AC
  Administered 2015-03-03: 1000 ug via INTRAMUSCULAR
  Filled 2015-03-03: qty 1

## 2015-03-03 NOTE — Progress Notes (Signed)
This is a Dr. Mike Gip patient that was inadvertently put on Dr. Gary Fleet schedule.  Patient does not offer any concerns today and reports he is tolerating the Casodex with no problems.

## 2015-03-17 NOTE — Progress Notes (Signed)
[facility] day:  03/17/2015  Chief Complaint: Charles Flores is an 81 y.o. male with prostate cancer who is referred in consultation by Dr. Rick Duff of [place].  HPI: The patient states that his prostate cancer was discovered approximately 20 years by an elevated PSA.  He underwent biopsy at [facility] (no report available).   He states that an "implant" was placed.  His PSA came down.  Approximately 4-5 years ago, his PS started going up again.    He states that he has seen Dr. Radford Pax 4-5 times.  Reports indicate T4N0 disease.  PSA was 774 on first presentation.  He was started on Lupron.  He receives an injection every 6 months (last 12/02/2014).   He denies any side effects.  He states that "it isn't working anymore".  PSA was 127.6 on 06/30/2014 and 168.3 on 12/02/2014.  Last bone scan on 04/08/2014 revealed no evidence of metastatic disease.  He denies any bone pain.  He denies any urinary symptoms.  He currently feels well and is asymptomatic. He feels at his baseline and offers no specific complaints.  Past Medical History  Diagnosis Date  . Prostate cancer   . Arthritis   . Cataract   . GERD (gastroesophageal reflux disease)   . Anemia 12/24/2014  . Hyperlipidemia   . Hyperlipidemia   . Coronary artery disease   . B12 deficiency anemia 01/01/2015    Past Surgical History  Procedure Laterality Date  . Prostate biopsy      Family History  Problem Relation Age of Onset  . Cancer Mother   . Breast cancer Mother   . Kidney disease Father   . Diabetes Sister   . Stroke Sister   . Gastric cancer Sister     Social History:  reports that he has quit smoking. His smoking use included Cigarettes. He has a 7.5 pack-year smoking history. He has never used smokeless tobacco. He reports that he does not drink alcohol or use illicit drugs.  Allergies: No Known Allergies  Current Outpatient  Prescriptions  Medication Sig Dispense Refill  . bicalutamide (CASODEX) 50 MG tablet Take 1 tablet (50 mg total) by mouth daily. 30 tablet 3   No current facility-administered medications for this visit.   ROS  GENERAL:  Feels good.  Active.  No fevers.  Some sweats at night.  Lost 20# in a year. PERFORMANCE STATUS (ECOG):  1 HEENT:  No visual changes, runny nose, sore throat, mouth sores or tenderness. Lungs: No shortness of breath or cough.  No hemoptysis. Cardiac:  No chest pain, palpitations, orthopnea, or PND. GI:  No nausea, vomiting, diarrhea, constipation, melena or hematochezia. GU:  No urgency, frequency, dysuria, or hematuria. Musculoskeletal:  No back pain.  No joint pain.  No muscle tenderness. Extremities:  No pain or swelling. Skin:  No rashes or skin changes. Neuro:  No headache, numbness or weakness.  Sometimes balance or coordination issues. Endocrine:  No diabetes, thyroid issues, hot flashes or night sweats. Psych:  No mood changes, depression or anxiety. Pain:  No focal pain. Review of systems:  All other systems reviewed and found to be negative.  Physical Exam  Blood pressure 123/76, pulse 76, temperature 96 F (35.6 C), temperature source Tympanic, resp. rate 16, weight 161 lb 2.5 oz (73.1 kg).  GENERAL:  Well developed, well nourished gentleman sitting comfortably in the exam room in no acute distress. MENTAL STATUS:  Alert and oriented to person, place and time. HEAD:  Wearing a gray hat.  Alopecia.  Normocephalic, atraumatic, face symmetric, no Cushingoid features. EYES:  Brown eyes (prominent).  Pupils equal round and reactive to light and accomodation.  No conjunctivitis or scleral icterus. ENT:  Oropharynx clear without lesion.  Tongue normal. Mucous membranes moist. Dentures. RESPIRATORY:  Clear to auscultation without rales, wheezes or rhonchi. CARDIOVASCULAR:  Regular rate and rhythm without murmur, rub or gallop. ABDOMEN:  Soft, non-tender, with  active bowel sounds, and no hepatosplenomegaly.  No masses. BACK:  No tenderness on percussion of the back or rib cage. SKIN:  No rashes, ulcers or lesions. EXTREMITIES: Trace lower extremity edema.  No skin discoloration or tenderness.  No palpable cords. LYMPH NODES: No palpable cervical, supraclavicular, axillary or inguinal adenopathy  NEUROLOGICAL: Unremarkable. PSYCH:  Appropriate.  Appointment on 03/03/2015  Component Date Value Ref Range Status  . Vitamin B-12 03/03/2015 768  180 - 914 pg/mL Final   Comment: (NOTE) This assay is not validated for testing neonatal or myeloproliferative syndrome specimens for Vitamin B12 levels. Performed at [facility]   . WBC 03/03/2015 4.3  3.8 - 10.6 K/uL Final  . RBC 03/03/2015 3.78* 4.40 - 5.90 MIL/uL Final  . Hemoglobin 03/03/2015 11.9* 13.0 - 18.0 g/dL Final  . HCT 03/03/2015 36.0* 40.0 - 52.0 % Final  . MCV 03/03/2015 95.2  80.0 - 100.0 fL Final  . MCH 03/03/2015 31.6  26.0 - 34.0 pg Final  . MCHC 03/03/2015 33.2  32.0 - 36.0 g/dL Final  . RDW 03/03/2015 14.1  11.5 - 14.5 % Final  . Platelets 03/03/2015 196  150 - 440 K/uL Final  . Neutrophils Relative % 03/03/2015 49   Final  . Neutro Abs 03/03/2015 2.1  1.4 - 6.5 K/uL Final  . Lymphocytes Relative 03/03/2015 27   Final  . Lymphs Abs 03/03/2015 1.2  1.0 - 3.6 K/uL Final  . Monocytes Relative 03/03/2015 11   Final  . Monocytes Absolute 03/03/2015 0.5  0.2 - 1.0 K/uL Final  . Eosinophils Relative 03/03/2015 12   Final  . Eosinophils Absolute 03/03/2015 0.5  0 - 0.7 K/uL Final  . Basophils Relative 03/03/2015 1   Final  . Basophils Absolute 03/03/2015 0.1  0 - 0.1 K/uL Final  . PSA 03/03/2015 40.31* 0.00 - 4.00 ng/mL Final   Comment: (NOTE) While PSA levels of <=4.0 ng/ml are reported as reference range, some men with levels below 4.0 ng/ml can have prostate cancer and many men with PSA above 4.0 ng/ml do not have prostate cancer.  Other tests such as free PSA, age  specific reference ranges, PSA velocity and PSA doubling time may be helpful especially in men less than 47 years old. Performed at [facility]   . Sodium 03/03/2015 135  135 - 145 mmol/L Final  . Potassium 03/03/2015 4.2  3.5 - 5.1 mmol/L Final  . Chloride 03/03/2015 103  101 - 111 mmol/L Final  . CO2 03/03/2015 26  22 - 32 mmol/L Final  . Glucose, Bld 03/03/2015 94  65 - 99 mg/dL Final  . BUN 03/03/2015 17  6 - 20 mg/dL Final  . Creatinine, Ser 03/03/2015 1.29* 0.61 - 1.24 mg/dL Final  . Calcium 03/03/2015 8.9  8.9 - 10.3 mg/dL Final  . Total Protein 03/03/2015 7.4  6.5 - 8.1 g/dL Final  . Albumin 03/03/2015 4.0  3.5 - 5.0 g/dL Final  . AST 03/03/2015 13* 15 - 41 U/L  Final  . ALT 03/03/2015 7* 17 - 63 U/L Final  . Alkaline Phosphatase 03/03/2015 68  38 - 126 U/L Final  . Total Bilirubin 03/03/2015 0.7  0.3 - 1.2 mg/dL Final  . GFR calc non Af Amer 03/03/2015 48* >60 mL/min Final  . GFR calc Af Amer 03/03/2015 56* >60 mL/min Final   Comment: (NOTE) The eGFR has been calculated using the CKD EPI equation. This calculation has not been validated in all clinical situations. eGFR's persistently <60 mL/min signify possible Chronic Kidney Disease.   . Anion gap 03/03/2015 6  5 - 15 Final    Assessment:  The patient is a 81 year old African American gentleman with prostate cancer and a rising PSA on Lupron.  Prostate cancer was discovered approximately 20 years secondary to an elevated PSA.  He underwent biopsy at [facility] (no report available).     Initial PSA was 774.  PSA was 127.6 on 06/30/2014 and 168.3 on 12/02/2014.  Bone scan on 04/08/2014 revealed no evidence of metastatic disease.  He denies any bone pain.  He denies any urinary symptoms.  He has a history of anemia.  He states that his diet is good.  He eats meat 2 times a week.  He denies any melena or hematochezia.  He denies any hematuria.  Last colonoscopy was more than 10 years ago.  Plan:    1)  Prostate cancer: Continue Casodex as ordered. Continue Lupron with Dr. Elnoria Howard as prescribed. 2)  Anemia: Patient's hemoglobin 11.9, no intervention needed at this time. Patient does not require B 12 injection. Discuss anemia work-up.  If iron deficiency documented, will need referral to GI as diet good. 3)  Return to clinic in 1 month with repeat laboratory work and further evaluation.  Lloyd Huger, MD  03/17/2015, 5:17 PM

## 2015-04-08 ENCOUNTER — Encounter: Payer: Self-pay | Admitting: Hematology and Oncology

## 2015-04-08 ENCOUNTER — Inpatient Hospital Stay: Payer: Medicare Other | Attending: Hematology and Oncology

## 2015-04-08 ENCOUNTER — Inpatient Hospital Stay: Payer: Medicare Other

## 2015-04-08 ENCOUNTER — Inpatient Hospital Stay (HOSPITAL_BASED_OUTPATIENT_CLINIC_OR_DEPARTMENT_OTHER): Payer: Medicare Other | Admitting: Hematology and Oncology

## 2015-04-08 VITALS — BP 110/70 | HR 69 | Temp 96.1°F | Resp 18

## 2015-04-08 VITALS — BP 121/77 | HR 74 | Temp 97.1°F | Resp 18 | Wt 159.4 lb

## 2015-04-08 DIAGNOSIS — Z79818 Long term (current) use of other agents affecting estrogen receptors and estrogen levels: Secondary | ICD-10-CM | POA: Diagnosis not present

## 2015-04-08 DIAGNOSIS — Z79899 Other long term (current) drug therapy: Secondary | ICD-10-CM | POA: Diagnosis not present

## 2015-04-08 DIAGNOSIS — D510 Vitamin B12 deficiency anemia due to intrinsic factor deficiency: Secondary | ICD-10-CM

## 2015-04-08 DIAGNOSIS — Z87891 Personal history of nicotine dependence: Secondary | ICD-10-CM | POA: Diagnosis not present

## 2015-04-08 DIAGNOSIS — E785 Hyperlipidemia, unspecified: Secondary | ICD-10-CM | POA: Insufficient documentation

## 2015-04-08 DIAGNOSIS — C61 Malignant neoplasm of prostate: Secondary | ICD-10-CM

## 2015-04-08 DIAGNOSIS — I2510 Atherosclerotic heart disease of native coronary artery without angina pectoris: Secondary | ICD-10-CM | POA: Insufficient documentation

## 2015-04-08 DIAGNOSIS — D519 Vitamin B12 deficiency anemia, unspecified: Secondary | ICD-10-CM

## 2015-04-08 DIAGNOSIS — K219 Gastro-esophageal reflux disease without esophagitis: Secondary | ICD-10-CM | POA: Diagnosis not present

## 2015-04-08 DIAGNOSIS — M1990 Unspecified osteoarthritis, unspecified site: Secondary | ICD-10-CM | POA: Diagnosis not present

## 2015-04-08 LAB — COMPREHENSIVE METABOLIC PANEL
ALT: 7 U/L — ABNORMAL LOW (ref 17–63)
AST: 14 U/L — ABNORMAL LOW (ref 15–41)
Albumin: 4.2 g/dL (ref 3.5–5.0)
Alkaline Phosphatase: 58 U/L (ref 38–126)
Anion gap: 5 (ref 5–15)
BUN: 22 mg/dL — ABNORMAL HIGH (ref 6–20)
CO2: 26 mmol/L (ref 22–32)
Calcium: 8.9 mg/dL (ref 8.9–10.3)
Chloride: 105 mmol/L (ref 101–111)
Creatinine, Ser: 1.35 mg/dL — ABNORMAL HIGH (ref 0.61–1.24)
GFR calc Af Amer: 53 mL/min — ABNORMAL LOW (ref >60)
GFR calc non Af Amer: 46 mL/min — ABNORMAL LOW (ref >60)
Glucose, Bld: 91 mg/dL (ref 65–99)
Potassium: 4.2 mmol/L (ref 3.5–5.1)
Sodium: 136 mmol/L (ref 135–145)
Total Bilirubin: 0.6 mg/dL (ref 0.3–1.2)
Total Protein: 7.6 g/dL (ref 6.5–8.1)

## 2015-04-08 LAB — CBC WITH DIFFERENTIAL/PLATELET
BASOS ABS: 0 10*3/uL (ref 0–0.1)
BASOS PCT: 1 %
Eosinophils Absolute: 0.3 10*3/uL (ref 0–0.7)
Eosinophils Relative: 8 %
HEMATOCRIT: 35.4 % — AB (ref 40.0–52.0)
HEMOGLOBIN: 12 g/dL — AB (ref 13.0–18.0)
LYMPHS PCT: 30 %
Lymphs Abs: 1 10*3/uL (ref 1.0–3.6)
MCH: 32.1 pg (ref 26.0–34.0)
MCHC: 33.9 g/dL (ref 32.0–36.0)
MCV: 94.8 fL (ref 80.0–100.0)
Monocytes Absolute: 0.4 10*3/uL (ref 0.2–1.0)
Monocytes Relative: 13 %
NEUTROS ABS: 1.6 10*3/uL (ref 1.4–6.5)
NEUTROS PCT: 48 %
Platelets: 182 10*3/uL (ref 150–440)
RBC: 3.73 MIL/uL — AB (ref 4.40–5.90)
RDW: 14.2 % (ref 11.5–14.5)
WBC: 3.4 10*3/uL — AB (ref 3.8–10.6)

## 2015-04-08 LAB — VITAMIN B12: Vitamin B-12: 388 pg/mL (ref 180–914)

## 2015-04-08 LAB — PSA: PSA: 29.12 ng/mL — ABNORMAL HIGH (ref 0.00–4.00)

## 2015-04-08 MED ORDER — CYANOCOBALAMIN 1000 MCG/ML IJ SOLN
1000.0000 ug | Freq: Once | INTRAMUSCULAR | Status: AC
  Administered 2015-04-08: 1000 ug via INTRAMUSCULAR
  Filled 2015-04-08: qty 1

## 2015-04-08 NOTE — Progress Notes (Signed)
[facility] day:  04/08/2015  Chief Complaint: Charles Flores is an 81 y.o. male with prostate cancer and B12 deficiency who is seen for 2 month assessment on Casodex and continuation of B12 monthly.  HPI: The patient was last seen in the medical oncology clinic on 02/01/2015.  At that time, he was doing well.  He began weekly B12 for documented B12 deficiency.  He continued his Casodex.  PSA was 63.32.  He received B12 on 06/13, 06/20, 06/27, 07/05, and 03/03/2015.   During the interim, he has done well.  He has been working in his garden.  He notes mild hot flashes and night sweats.  He continues his Casodex.  Past Medical History  Diagnosis Date  . Prostate cancer   . Arthritis   . Cataract   . GERD (gastroesophageal reflux disease)   . Anemia 12/24/2014  . Hyperlipidemia   . Hyperlipidemia   . Coronary artery disease   . B12 deficiency anemia 01/01/2015    Past Surgical History  Procedure Laterality Date  . Prostate biopsy      Family History  Problem Relation Age of Onset  . Cancer Mother   . Breast cancer Mother   . Kidney disease Father   . Diabetes Sister   . Stroke Sister   . Gastric cancer Sister     Social History:  reports that he has quit smoking. His smoking use included Cigarettes. He has a 7.5 pack-year smoking history. He has never used smokeless tobacco. He reports that he does not drink alcohol or use illicit drugs.  He is alone today.  Allergies: No Known Allergies  Current Outpatient Prescriptions  Medication Sig Dispense Refill  . bicalutamide (CASODEX) 50 MG tablet Take 1 tablet (50 mg total) by mouth daily. 30 tablet 3   No current facility-administered medications for this visit.   ROS  GENERAL:  Feels good.  Active.  No fevers.  Mild night sweats.  Weight stable.   PERFORMANCE STATUS (ECOG):  1 HEENT:  No visual changes, runny nose, sore throat, mouth sores or tenderness. Lungs: No shortness of  breath or cough.  No hemoptysis. Cardiac:  No chest pain, palpitations, orthopnea, or PND. GI:  No nausea, vomiting, diarrhea, constipation, melena or hematochezia. GU:  No urgency, frequency, dysuria, or hematuria. Musculoskeletal:  No back pain.  No joint pain.  No muscle tenderness. Extremities:  No pain or swelling. Skin:  No rashes or skin changes. Neuro:  No headache, numbness or weakness.  Sometimes balance or coordination issues. Endocrine:  No diabetes, thyroid issues, hot flashes or night sweats. Psych:  No mood changes, depression or anxiety. Pain:  No focal pain. Review of systems:  All other systems reviewed and found to be negative.  Physical Exam Blood pressure 121/77, pulse 74, temperature 97.1 F (36.2 C), temperature source Tympanic, resp. rate 18, weight 159 lb 6.3 oz (72.3 kg), SpO2 98 %.  GENERAL:  Well developed, well nourished gentleman sitting comfortably in the exam room in no acute distress. MENTAL STATUS:  Alert and oriented to person, place and time. HEAD:  Wearing a straw hat.  Alopecia.  Slight gray mustache.  Normocephalic, atraumatic, face symmetric, no Cushingoid features. EYES:  Brown eyes (prominent) with slight lid lag.  Pupils equal round and reactive to light and accomodation.  No conjunctivitis or scleral icterus. ENT:  Oropharynx clear without lesion. Tongue normal. Mucous membranes moist. Dentures. RESPIRATORY:  Clear to auscultation without rales,  wheezes or rhonchi. CARDIOVASCULAR:  Regular rate and rhythm without murmur, rub or gallop. ABDOMEN:  Soft, non-tender, with active bowel sounds, and no hepatosplenomegaly.  No masses. SKIN:  No rashes, ulcers or lesions. EXTREMITIES: No edema, skin discoloration or tenderness.  No palpable cords. LYMPH NODES: No palpable cervical, supraclavicular, axillary or inguinal adenopathy  NEUROLOGICAL: Unremarkable. PSYCH:  Appropriate.  Appointment on 04/08/2015  Component Date Value Ref Range Status  . WBC  04/08/2015 3.4* 3.8 - 10.6 K/uL Final  . RBC 04/08/2015 3.73* 4.40 - 5.90 MIL/uL Final  . Hemoglobin 04/08/2015 12.0* 13.0 - 18.0 g/dL Final  . HCT 04/08/2015 35.4* 40.0 - 52.0 % Final  . MCV 04/08/2015 94.8  80.0 - 100.0 fL Final  . MCH 04/08/2015 32.1  26.0 - 34.0 pg Final  . MCHC 04/08/2015 33.9  32.0 - 36.0 g/dL Final  . RDW 04/08/2015 14.2  11.5 - 14.5 % Final  . Platelets 04/08/2015 182  150 - 440 K/uL Final  . Neutrophils Relative % 04/08/2015 48   Final  . Neutro Abs 04/08/2015 1.6  1.4 - 6.5 K/uL Final  . Lymphocytes Relative 04/08/2015 30   Final  . Lymphs Abs 04/08/2015 1.0  1.0 - 3.6 K/uL Final  . Monocytes Relative 04/08/2015 13   Final  . Monocytes Absolute 04/08/2015 0.4  0.2 - 1.0 K/uL Final  . Eosinophils Relative 04/08/2015 8   Final  . Eosinophils Absolute 04/08/2015 0.3  0 - 0.7 K/uL Final  . Basophils Relative 04/08/2015 1   Final  . Basophils Absolute 04/08/2015 0.0  0 - 0.1 K/uL Final    Assessment:  The patient is a 81 year old African American gentleman with prostate cancer and a rising PSA on Lupron.  Prostate cancer was discovered approximately 20 years secondary to an elevated PSA.  He underwent biopsy at [facility] (no report available).     Initial PSA was 774.  PSA was 127.6 on 06/30/2014 and 168.3 on 12/02/2014.  Bone scan on 04/08/2014 and 12/29/2014 revealed no evidence of metastatic disease.  He denies any bone pain.  He denies any urinary symptoms.  He is on Lupron and Casodex.  PSA was 63.32 on 02/01/2015 and 40.31 on 03/03/2015.  He has a normocytic anemia. Work-up on 12/24/2014 revealed a B12 deficiency. B12 was 169. He began B12 supplimentation on 02/01/2015.  Normal studies included a CMP, ferritin, iron studies, folate, and TSH. Diet is good. He eats meat 2 times a week. He denies any melena, hematochezia, or hematuria. Last colonoscopy was more than 10 years ago.  Symptomatically, he is doing well.  He has mild hot  flashes/night sweats.  He denies bone pain.  Plan:   1.  Labs today:  CBC with diff, CMP, PSA, B12. 2.  B12 today then monthly. 3.  Continue Casodex. 4.  Follow-up as scheduled with Dr. Elnoria Howard on 05/19/2015 for Lupron. 5.  RTC in 3 months for MD assess, labs (CBC with diff, CMP, PSA), and B12.   Lequita Asal, MD  04/08/2015, 10:34 AM

## 2015-04-08 NOTE — Progress Notes (Signed)
Patient here today for follow up Prostate Cancer and Anemia. Denies having pain. States he has been feeling well. No complaints voiced.

## 2015-04-26 ENCOUNTER — Other Ambulatory Visit: Payer: Self-pay | Admitting: Hematology and Oncology

## 2015-05-10 ENCOUNTER — Inpatient Hospital Stay: Payer: Medicare Other | Attending: Hematology and Oncology

## 2015-05-10 DIAGNOSIS — Z79818 Long term (current) use of other agents affecting estrogen receptors and estrogen levels: Secondary | ICD-10-CM | POA: Diagnosis not present

## 2015-05-10 DIAGNOSIS — C61 Malignant neoplasm of prostate: Secondary | ICD-10-CM | POA: Diagnosis not present

## 2015-05-10 DIAGNOSIS — D519 Vitamin B12 deficiency anemia, unspecified: Secondary | ICD-10-CM

## 2015-05-10 MED ORDER — CYANOCOBALAMIN 1000 MCG/ML IJ SOLN
1000.0000 ug | Freq: Once | INTRAMUSCULAR | Status: AC
  Administered 2015-05-10: 1000 ug via INTRAMUSCULAR
  Filled 2015-05-10: qty 1

## 2015-05-12 DIAGNOSIS — H40003 Preglaucoma, unspecified, bilateral: Secondary | ICD-10-CM | POA: Diagnosis not present

## 2015-05-19 ENCOUNTER — Ambulatory Visit: Payer: Self-pay | Admitting: Urology

## 2015-05-19 ENCOUNTER — Ambulatory Visit: Payer: Self-pay

## 2015-05-26 ENCOUNTER — Telehealth: Payer: Self-pay | Admitting: *Deleted

## 2015-05-26 MED ORDER — BICALUTAMIDE 50 MG PO TABS: 50.0000 mg | ORAL_TABLET | Freq: Every day | ORAL | Status: DC

## 2015-05-26 NOTE — Telephone Encounter (Signed)
Escribed

## 2015-06-04 ENCOUNTER — Ambulatory Visit (INDEPENDENT_AMBULATORY_CARE_PROVIDER_SITE_OTHER): Payer: Medicare Other | Admitting: Urology

## 2015-06-04 ENCOUNTER — Encounter: Payer: Self-pay | Admitting: Urology

## 2015-06-04 VITALS — BP 117/64 | HR 82 | Ht 71.0 in | Wt 165.6 lb

## 2015-06-04 DIAGNOSIS — C61 Malignant neoplasm of prostate: Secondary | ICD-10-CM

## 2015-06-04 MED ORDER — LEUPROLIDE ACETATE (6 MONTH) 45 MG IM KIT
45.0000 mg | PACK | Freq: Once | INTRAMUSCULAR | Status: AC
  Administered 2015-06-04: 45 mg via INTRAMUSCULAR

## 2015-06-04 NOTE — Progress Notes (Signed)
Lupron IM Injection   Due to Prostate Cancer patient is present today for a Lupron Injection.  Medication: Lupron 6 month Dose: 45 mg  Location: left upper outer buttocks Lot: [id number] Exp: 10/13/2016  Patient tolerated well, no complications were noted  Performed by: Fonnie Jarvis, CMA  Follow up: 30month Lupron w/PSA

## 2015-06-04 NOTE — Progress Notes (Signed)
06/04/2015 9:31 AM   Charles Flores 1927-04-20 [id number]  Referring provider: Christie Nottingham, [place] [place], [place] [address]  Chief Complaint  Patient presents with  . Prostate Cancer    FOLLOW UP AND POSSIBLE LUPRON INJECTION    HPI:  1 - Advanced Prostate Cancer - long h/o prostate cancer on androgen deprivation for years. Prostate still in situ. PSA at initial DX over 700.  Recent Course: 11/2014 PSA 168 ==> started casodex in addition to lupron, bone scan and CT w/o gross mets 03/2015 PSA 29.12; 05/2015 Lupron 45mg   Today "Charles Flores" is seen in f/u above and lupron injection. No interval bone pain, obstructive urinary symptoms, or hematuria.    PMH: Past Medical History  Diagnosis Date  . Prostate cancer ([place])   . Arthritis   . Cataract   . GERD (gastroesophageal reflux disease)   . Anemia 12/24/2014  . Hyperlipidemia   . Hyperlipidemia   . Coronary artery disease   . B12 deficiency anemia 01/01/2015    Surgical History: Past Surgical History  Procedure Laterality Date  . Prostate biopsy      Home Medications:    Medication List       This list is accurate as of: 06/04/15  9:31 AM.  Always use your most recent med list.               bicalutamide 50 MG tablet  Commonly known as:  CASODEX  Take 1 tablet (50 mg total) by mouth daily.        Allergies: No Known Allergies  Family History: Family History  Problem Relation Age of Onset  . Cancer Mother   . Breast cancer Mother   . Kidney disease Father   . Diabetes Sister   . Stroke Sister   . Gastric cancer Sister     Social History:  reports that he has quit smoking. His smoking use included Cigarettes. He has a 7.5 pack-year smoking history. He has never used smokeless tobacco. He reports that he does not drink alcohol or use illicit drugs.  ROS: UROLOGY Frequent Urination?: No Hard to postpone urination?: No Burning/pain with urination?: No Get up at night to urinate?: No Leakage of  urine?: No Urine stream starts and stops?: No Trouble starting stream?: No Do you have to strain to urinate?: No Blood in urine?: No Urinary tract infection?: No Sexually transmitted disease?: No Injury to kidneys or bladder?: No Painful intercourse?: No Weak stream?: Yes Erection problems?: No Penile pain?: No  Gastrointestinal Nausea?: No Vomiting?: No Indigestion/heartburn?: No Diarrhea?: No Constipation?: Yes  Constitutional Fever: No Night sweats?: Yes Weight loss?: Yes Fatigue?: Yes  Skin Skin rash/lesions?: No Itching?: No  Eyes Blurred vision?: No Double vision?: No  Ears/Nose/Throat Sore throat?: No Sinus problems?: No  Hematologic/Lymphatic Swollen glands?: No Easy bruising?: No  Cardiovascular Leg swelling?: Yes Chest pain?: No  Respiratory Cough?: No Shortness of breath?: No  Endocrine Excessive thirst?: No  Musculoskeletal Back pain?: No Joint pain?: No  Neurological Headaches?: No Dizziness?: No  Psychologic Depression?: No Anxiety?: No  Physical Exam: BP 117/64 mmHg  Pulse 82  Ht 5\' 11"  (1.803 m)  Wt 165 lb 9.6 oz (75.116 kg)  BMI 23.11 kg/m2  Constitutional:  Alert and oriented, No acute distress. HEENT: [place] AT, moist mucus membranes.  Trachea midline, no masses. Cardiovascular: No clubbing, cyanosis, or edema. Respiratory: Normal respiratory effort, no increased work of breathing. GI: Abdomen is soft, nontender, nondistended, no abdominal masses GU:  No CVA tenderness. Prostate atrophic, essentially non-palpable.  Skin: No rashes, bruises or suspicious lesions. Lymph: No cervical or inguinal adenopathy. Neurologic: Grossly intact, no focal deficits, moving all 4 extremities. Psychiatric: Normal mood and affect.  Laboratory Data: Lab Results  Component Value Date   WBC 3.4* 04/08/2015   HGB 12.0* 04/08/2015   HCT 35.4* 04/08/2015   MCV 94.8 04/08/2015   PLT 182 04/08/2015    Lab Results  Component Value Date    CREATININE 1.35* 04/08/2015    Lab Results  Component Value Date   PSA 29.12* 04/08/2015   PSA 40.31* 03/03/2015   PSA 63.32* 02/01/2015    No results found for: TESTOSTERONE  No results found for: HGBA1C  Urinalysis No results found for: COLORURINE, APPEARANCEUR, LABSPEC, PHURINE, GLUCOSEU, HGBUR, BILIRUBINUR, KETONESUR, PROTEINUR, UROBILINOGEN, NITRITE, LEUKOCYTESUR   Assessment & Plan:    1. Advanced Prostate Cancer - PSA continues to trend down on androgen deprivation + casodex. He has no cancer-specific symptoms. This is good news. Continue current regimen.  2 - RTC 1mos with PSA,T prior.  Return in about 6 months (around 12/03/2015) for next lupron 45mg .  Alexis Frock, [place] [address], [place], [place] [address] [phone]

## 2015-06-08 ENCOUNTER — Inpatient Hospital Stay: Payer: Medicare Other | Attending: Hematology and Oncology

## 2015-06-17 DIAGNOSIS — H6123 Impacted cerumen, bilateral: Secondary | ICD-10-CM | POA: Diagnosis not present

## 2015-06-17 DIAGNOSIS — D519 Vitamin B12 deficiency anemia, unspecified: Secondary | ICD-10-CM | POA: Diagnosis not present

## 2015-06-17 DIAGNOSIS — J439 Emphysema, unspecified: Secondary | ICD-10-CM | POA: Diagnosis not present

## 2015-06-17 DIAGNOSIS — Z0001 Encounter for general adult medical examination with abnormal findings: Secondary | ICD-10-CM | POA: Diagnosis not present

## 2015-06-17 DIAGNOSIS — Z125 Encounter for screening for malignant neoplasm of prostate: Secondary | ICD-10-CM | POA: Diagnosis not present

## 2015-06-24 DIAGNOSIS — G4733 Obstructive sleep apnea (adult) (pediatric): Secondary | ICD-10-CM | POA: Diagnosis not present

## 2015-07-01 ENCOUNTER — Other Ambulatory Visit: Payer: Self-pay | Admitting: *Deleted

## 2015-07-01 DIAGNOSIS — C61 Malignant neoplasm of prostate: Secondary | ICD-10-CM

## 2015-07-01 DIAGNOSIS — D519 Vitamin B12 deficiency anemia, unspecified: Secondary | ICD-10-CM

## 2015-07-02 DIAGNOSIS — H6123 Impacted cerumen, bilateral: Secondary | ICD-10-CM | POA: Diagnosis not present

## 2015-07-09 ENCOUNTER — Inpatient Hospital Stay: Payer: Medicare Other

## 2015-07-09 ENCOUNTER — Inpatient Hospital Stay: Payer: Medicare Other | Admitting: Family Medicine

## 2015-07-09 ENCOUNTER — Other Ambulatory Visit: Payer: Self-pay | Admitting: Hematology and Oncology

## 2015-07-09 ENCOUNTER — Encounter: Payer: Self-pay | Admitting: Hematology and Oncology

## 2015-07-09 ENCOUNTER — Ambulatory Visit: Payer: Medicare Other | Admitting: Hematology and Oncology

## 2015-07-09 NOTE — Progress Notes (Signed)
[facility] day:  01/01/2015  Chief Complaint: Charles Flores is an 81 y.o. male with prostate cancer who is seen for review of work-up and initiation of Casodex.  HPI: The patient was last seen in the medical oncology clinic on 12/24/2014.  At that time seen in consultation regarding his prostate cancer. He had been receiving Lupron to urology. PSA was climbing. We discussed initiation of Casodex. Information was provided.  In addition, the patient was noted to have history of anemia. His diet was good. Denied any melena, hematochezia or hematuria. Last colonoscopy was more than 10 years ago. He underwent a workup. CBC revealed a hematocrit of 36.1, hemoglobin 12, MCV 95.7, platelets 192,000, white count 3800 with an ANC of 1600. Differential was unremarkable.  B12 was 169 (low). Ferritin was 158 with a saturation of 22%. Folate was 11. TSH was 2.487.  CMP included a creatinine of 0.96, calcium 9.3, and a protein of 7.2.  Bone scan on 12/29/2014 revealed degenerative uptake in the right rotator cuff and bilateral knees. There was no evidence of metastatic disease.   Symptomatically, he denies any complaints.  Past Medical History  Diagnosis Date  . Prostate cancer ([place])   . Arthritis   . Cataract   . GERD (gastroesophageal reflux disease)   . Anemia 12/24/2014  . Hyperlipidemia   . Hyperlipidemia   . Coronary artery disease   . B12 deficiency anemia 01/01/2015    Past Surgical History  Procedure Laterality Date  . Prostate biopsy      Family History  Problem Relation Age of Onset  . Cancer Mother   . Breast cancer Mother   . Kidney disease Father   . Diabetes Sister   . Stroke Sister   . Gastric cancer Sister     Social History:  reports that he has quit smoking. His smoking use included Cigarettes. He has a 7.5 pack-year smoking history. He has never used smokeless tobacco. He reports that he does not drink alcohol or use illicit  drugs.  Allergies: No Known Allergies  Current Outpatient Prescriptions  Medication Sig Dispense Refill  . bicalutamide (CASODEX) 50 MG tablet Take 1 tablet (50 mg total) by mouth daily. 90 tablet 0   No current facility-administered medications for this visit.   ROS  GENERAL:  Feels good. No fevers.  Weight down 2 pounds. PERFORMANCE STATUS (ECOG):  1 HEENT:  No visual changes, runny nose, sore throat, mouth sores or tenderness. Lungs: No shortness of breath or cough.  No hemoptysis. Cardiac:  No chest pain, palpitations, orthopnea, or PND. GI:  No nausea, vomiting, diarrhea, constipation, melena or hematochezia. GU:  No urgency, frequency, dysuria, or hematuria. Musculoskeletal:  No back pain.  No joint pain.  No muscle tenderness. Extremities:  No pain or swelling. Skin:  No rashes or skin changes. Neuro:  No headache, numbness or weakness.  Sometimes balance or coordination issues. Endocrine:  No diabetes, thyroid issues, hot flashes or night sweats. Psych:  No mood changes, depression or anxiety. Pain:  No focal pain. Review of systems:  All other systems reviewed and found to be negative.  Physical Exam  Blood pressure 131/78, pulse 79, temperature 98.3 F (36.8 C), temperature source Oral, height '5\' 11"'  (1.803 m), weight 164 lb 14.5 oz (74.801 kg).  GENERAL:  Well developed, well nourished gentleman sitting comfortably in the exam room in no acute distress. MENTAL STATUS:  Alert and oriented to person, place and time.  HEAD:  Wearing a gray hat.  Alopecia.  Normocephalic, atraumatic, face symmetric, no Cushingoid features. EYES:  Brown eyes (prominent).  Pupils equal round and reactive to light and accomodation.  No conjunctivitis or scleral icterus. ENT:  Oropharynx clear without lesion.  Tongue normal. Mucous membranes moist. Dentures. RESPIRATORY:  Clear to auscultation without rales, wheezes or rhonchi. CARDIOVASCULAR:  Regular rate and rhythm without murmur, rub or  gallop. ABDOMEN:  Soft, non-tender, with active bowel sounds, and no hepatosplenomegaly.  No masses. SKIN:  No rashes, ulcers or lesions. EXTREMITIES: Trace lower extremity edema.  No skin discoloration or tenderness.  No palpable cords. LYMPH NODES: No palpable cervical, supraclavicular, axillary or inguinal adenopathy  NEUROLOGICAL: Unremarkable. PSYCH:  Appropriate.  No visits with results within 3 Day(s) from this visit. Latest known visit with results is:  Appointment on 12/24/2014  Component Date Value Ref Range Status  . WBC 12/24/2014 3.8  3.8 - 10.6 K/uL Final  . RBC 12/24/2014 3.77* 4.40 - 5.90 MIL/uL Final  . Hemoglobin 12/24/2014 12.0* 13.0 - 18.0 g/dL Final  . HCT 12/24/2014 36.1* 40.0 - 52.0 % Final  . MCV 12/24/2014 95.7  80.0 - 100.0 fL Final  . MCH 12/24/2014 31.9  26.0 - 34.0 pg Final  . MCHC 12/24/2014 33.3  32.0 - 36.0 g/dL Final  . RDW 12/24/2014 14.8* 11.5 - 14.5 % Final  . Platelets 12/24/2014 192  150 - 440 K/uL Final  . Neutrophils Relative % 12/24/2014 41   Final  . Neutro Abs 12/24/2014 1.6  1.4 - 6.5 K/uL Final  . Lymphocytes Relative 12/24/2014 32   Final  . Lymphs Abs 12/24/2014 1.2  1.0 - 3.6 K/uL Final  . Monocytes Relative 12/24/2014 13   Final  . Monocytes Absolute 12/24/2014 0.5  0.2 - 1.0 K/uL Final  . Eosinophils Relative 12/24/2014 12   Final  . Eosinophils Absolute 12/24/2014 0.5  0 - 0.7 K/uL Final  . Basophils Relative 12/24/2014 2   Final  . Basophils Absolute 12/24/2014 0.1  0 - 0.1 K/uL Final  . Sodium 12/24/2014 139  135 - 145 mmol/L Final  . Potassium 12/24/2014 3.9  3.5 - 5.1 mmol/L Final  . Chloride 12/24/2014 105  101 - 111 mmol/L Final  . CO2 12/24/2014 26  22 - 32 mmol/L Final  . Glucose, Bld 12/24/2014 92  65 - 99 mg/dL Final  . BUN 12/24/2014 13  6 - 20 mg/dL Final  . Creatinine, Ser 12/24/2014 0.96  0.61 - 1.24 mg/dL Final  . Calcium 12/24/2014 9.3  8.9 - 10.3 mg/dL Final  . Total Protein 12/24/2014 7.2  6.5 - 8.1 g/dL  Final  . Albumin 12/24/2014 4.0  3.5 - 5.0 g/dL Final  . AST 12/24/2014 14* 15 - 41 U/L Final  . ALT 12/24/2014 6* 17 - 63 U/L Final  . Alkaline Phosphatase 12/24/2014 63  38 - 126 U/L Final  . Total Bilirubin 12/24/2014 0.5  0.3 - 1.2 mg/dL Final  . GFR calc non Af Amer 12/24/2014 >60  >60 mL/min Final  . GFR calc Af Amer 12/24/2014 >60  >60 mL/min Final   Comment: (NOTE) The eGFR has been calculated using the CKD EPI equation. This calculation has not been validated in all clinical situations. eGFR's persistently <60 mL/min signify possible Chronic Kidney Disease.   . Anion gap 12/24/2014 8  5 - 15 Final  . Ferritin 12/24/2014 158  24 - 336 ng/mL Final  . Iron 12/24/2014 64  45 -  182 ug/dL Final  . TIBC 12/24/2014 293  250 - 450 ug/dL Final  . Saturation Ratios 12/24/2014 22  17.9 - 39.5 % Final  . UIBC 12/24/2014 229   Final  . Vitamin B-12 12/24/2014 169* 180 - 914 pg/mL Final   Comment: (NOTE) This assay is not validated for testing neonatal or myeloproliferative syndrome specimens for Vitamin B12 levels. Performed at [facility]   . Folate 12/24/2014 11.0  >5.9 ng/mL Final  . TSH 12/24/2014 2.487  0.350 - 4.500 uIU/mL Final    Assessment:  The patient is a 81 year old African American gentleman with prostate cancer and a rising PSA on Lupron.  Prostate cancer was discovered approximately 20 years secondary to an elevated PSA.  He underwent biopsy at [facility] (no report available).     Initial PSA was 774.  PSA was 127.6 on 06/30/2014 and 168.3 on 12/02/2014.  Bone scan on 04/08/2014 revealed no evidence of metastatic disease.  Bone scan on 12/29/2014 revealed degenerative uptake in the right rotator cuff and bilateral knees. There was no evidence of metastatic disease.    He has a normocytic anemia.  Work-up on 12/24/2014 revealed a B12 deficiency.  B12 was 169.  Normal studies included a CMP, ferritin, iron studies, folate, and TSH.  Diet is  good.  He eats meat 2 times a week.  He denies any melena, hematochezia, or hematuria.  Last colonoscopy was more than 10 years ago.  Symptomatically, he denies any complaint.  Exam is unremarkable.  Plan:   1.  Review bone scan. 2.  Discuss work-up of anemia and diagnosis of B12 deficiency. 3.  Continue Lupron. 4.  Rx: Casodex. 5.  RTC weekly for B12 x 6 then monthly. 6.  RTC in 1 month for MD assess, labs (CMP, PSA).   Lequita Asal, MD  01/01/2015

## 2015-07-14 ENCOUNTER — Inpatient Hospital Stay: Payer: Medicare Other

## 2015-07-14 ENCOUNTER — Inpatient Hospital Stay: Payer: Medicare Other | Admitting: Family Medicine

## 2015-07-14 ENCOUNTER — Inpatient Hospital Stay: Payer: Medicare Other | Attending: Family Medicine

## 2015-07-14 DIAGNOSIS — D519 Vitamin B12 deficiency anemia, unspecified: Secondary | ICD-10-CM

## 2015-07-14 DIAGNOSIS — C61 Malignant neoplasm of prostate: Secondary | ICD-10-CM | POA: Diagnosis not present

## 2015-07-14 DIAGNOSIS — Z79818 Long term (current) use of other agents affecting estrogen receptors and estrogen levels: Secondary | ICD-10-CM | POA: Diagnosis not present

## 2015-07-14 LAB — COMPREHENSIVE METABOLIC PANEL
ALT: 7 U/L — ABNORMAL LOW (ref 17–63)
AST: 14 U/L — ABNORMAL LOW (ref 15–41)
Albumin: 3.9 g/dL (ref 3.5–5.0)
Alkaline Phosphatase: 65 U/L (ref 38–126)
Anion gap: 5 (ref 5–15)
BUN: 14 mg/dL (ref 6–20)
CO2: 26 mmol/L (ref 22–32)
Calcium: 9.2 mg/dL (ref 8.9–10.3)
Chloride: 107 mmol/L (ref 101–111)
Creatinine, Ser: 1.28 mg/dL — ABNORMAL HIGH (ref 0.61–1.24)
GFR calc Af Amer: 57 mL/min — ABNORMAL LOW (ref >60)
GFR calc non Af Amer: 49 mL/min — ABNORMAL LOW (ref >60)
Glucose, Bld: 95 mg/dL (ref 65–99)
Potassium: 4 mmol/L (ref 3.5–5.1)
Sodium: 138 mmol/L (ref 135–145)
Total Bilirubin: 0.4 mg/dL (ref 0.3–1.2)
Total Protein: 7.4 g/dL (ref 6.5–8.1)

## 2015-07-14 LAB — CBC WITH DIFFERENTIAL/PLATELET
Basophils Absolute: 0.1 10*3/uL (ref 0–0.1)
Basophils Relative: 1 %
Eosinophils Absolute: 0.6 10*3/uL (ref 0–0.7)
Eosinophils Relative: 12 %
HCT: 36.5 % — ABNORMAL LOW (ref 40.0–52.0)
Hemoglobin: 12.3 g/dL — ABNORMAL LOW (ref 13.0–18.0)
Lymphocytes Relative: 26 %
Lymphs Abs: 1.4 10*3/uL (ref 1.0–3.6)
MCH: 32.4 pg (ref 26.0–34.0)
MCHC: 33.7 g/dL (ref 32.0–36.0)
MCV: 96.3 fL (ref 80.0–100.0)
Monocytes Absolute: 0.5 10*3/uL (ref 0.2–1.0)
Monocytes Relative: 10 %
Neutro Abs: 2.7 10*3/uL (ref 1.4–6.5)
Neutrophils Relative %: 51 %
Platelets: 189 10*3/uL (ref 150–440)
RBC: 3.79 MIL/uL — ABNORMAL LOW (ref 4.40–5.90)
RDW: 13.7 % (ref 11.5–14.5)
WBC: 5.2 10*3/uL (ref 3.8–10.6)

## 2015-07-14 LAB — PSA: PSA: 11.28 ng/mL — ABNORMAL HIGH (ref 0.00–4.00)

## 2015-07-14 MED ORDER — CYANOCOBALAMIN 1000 MCG/ML IJ SOLN
1000.0000 ug | Freq: Once | INTRAMUSCULAR | Status: AC
  Administered 2015-07-14: 1000 ug via INTRAMUSCULAR
  Filled 2015-07-14: qty 1

## 2015-08-10 ENCOUNTER — Other Ambulatory Visit: Payer: Self-pay

## 2015-08-10 DIAGNOSIS — C61 Malignant neoplasm of prostate: Secondary | ICD-10-CM

## 2015-08-10 DIAGNOSIS — D519 Vitamin B12 deficiency anemia, unspecified: Secondary | ICD-10-CM

## 2015-08-16 ENCOUNTER — Other Ambulatory Visit: Payer: Self-pay | Admitting: Hematology and Oncology

## 2015-08-17 ENCOUNTER — Inpatient Hospital Stay (HOSPITAL_BASED_OUTPATIENT_CLINIC_OR_DEPARTMENT_OTHER): Payer: Medicare Other | Admitting: Hematology and Oncology

## 2015-08-17 ENCOUNTER — Inpatient Hospital Stay: Payer: Medicare Other

## 2015-08-17 ENCOUNTER — Inpatient Hospital Stay: Payer: Medicare Other | Attending: Hematology and Oncology

## 2015-08-17 VITALS — BP 136/84 | HR 78 | Temp 95.0°F | Resp 18 | Ht 71.0 in | Wt 164.5 lb

## 2015-08-17 DIAGNOSIS — R61 Generalized hyperhidrosis: Secondary | ICD-10-CM | POA: Diagnosis not present

## 2015-08-17 DIAGNOSIS — M1990 Unspecified osteoarthritis, unspecified site: Secondary | ICD-10-CM

## 2015-08-17 DIAGNOSIS — C61 Malignant neoplasm of prostate: Secondary | ICD-10-CM | POA: Diagnosis not present

## 2015-08-17 DIAGNOSIS — E785 Hyperlipidemia, unspecified: Secondary | ICD-10-CM | POA: Diagnosis not present

## 2015-08-17 DIAGNOSIS — D519 Vitamin B12 deficiency anemia, unspecified: Secondary | ICD-10-CM

## 2015-08-17 DIAGNOSIS — K219 Gastro-esophageal reflux disease without esophagitis: Secondary | ICD-10-CM

## 2015-08-17 DIAGNOSIS — I2510 Atherosclerotic heart disease of native coronary artery without angina pectoris: Secondary | ICD-10-CM

## 2015-08-17 DIAGNOSIS — Z79818 Long term (current) use of other agents affecting estrogen receptors and estrogen levels: Secondary | ICD-10-CM

## 2015-08-17 DIAGNOSIS — Z87891 Personal history of nicotine dependence: Secondary | ICD-10-CM | POA: Diagnosis not present

## 2015-08-17 LAB — COMPREHENSIVE METABOLIC PANEL
ALT: 8 U/L — ABNORMAL LOW (ref 17–63)
AST: 14 U/L — ABNORMAL LOW (ref 15–41)
Albumin: 4.2 g/dL (ref 3.5–5.0)
Alkaline Phosphatase: 61 U/L (ref 38–126)
Anion gap: 6 (ref 5–15)
BUN: 18 mg/dL (ref 6–20)
CO2: 25 mmol/L (ref 22–32)
Calcium: 9.2 mg/dL (ref 8.9–10.3)
Chloride: 103 mmol/L (ref 101–111)
Creatinine, Ser: 1.34 mg/dL — ABNORMAL HIGH (ref 0.61–1.24)
GFR calc Af Amer: 54 mL/min — ABNORMAL LOW (ref >60)
GFR calc non Af Amer: 46 mL/min — ABNORMAL LOW (ref >60)
Glucose, Bld: 97 mg/dL (ref 65–99)
Potassium: 4.3 mmol/L (ref 3.5–5.1)
Sodium: 134 mmol/L — ABNORMAL LOW (ref 135–145)
Total Bilirubin: 0.9 mg/dL (ref 0.3–1.2)
Total Protein: 7.6 g/dL (ref 6.5–8.1)

## 2015-08-17 LAB — PSA: PSA: 11.02 ng/mL — ABNORMAL HIGH (ref 0.00–4.00)

## 2015-08-17 MED ORDER — CYANOCOBALAMIN 1000 MCG/ML IJ SOLN
1000.0000 ug | Freq: Once | INTRAMUSCULAR | Status: AC
  Administered 2015-08-17: 1000 ug via INTRAMUSCULAR
  Filled 2015-08-17: qty 1

## 2015-08-17 NOTE — Progress Notes (Signed)
[facility] day:  08/17/2015  Chief Complaint: Charles Flores is an 81 y.o. male with prostate cancer and B12 deficiency who is seen for 4 month assessment on Casodex and continuation of B12 monthly.  HPI: The patient was last seen in the medical oncology clinic on 04/08/2015.  At that time, he was doing well.  He noted some mild hot flashes and night sweats.  He was on Casodex.  He received Lupron 45 mg IM on 06/04/2015.  PSA was 29.12 on 04/08/2015 and 11.28 on 07/14/2015.  He has continued his monthly B12 (last given 07/14/2015).  During the interim, he has felt "good".  He denies "any real problems".  Once in awhile he has night sweats.  He continues his Casodex.  Past Medical History  Diagnosis Date  . Prostate cancer ([place])   . Arthritis   . Cataract   . GERD (gastroesophageal reflux disease)   . Anemia 12/24/2014  . Hyperlipidemia   . Hyperlipidemia   . Coronary artery disease   . B12 deficiency anemia 01/01/2015    Past Surgical History  Procedure Laterality Date  . Prostate biopsy      Family History  Problem Relation Age of Onset  . Cancer Mother   . Breast cancer Mother   . Kidney disease Father   . Diabetes Sister   . Stroke Sister   . Gastric cancer Sister     Social History:  reports that he has quit smoking. His smoking use included Cigarettes. He has a 7.5 pack-year smoking history. He has never used smokeless tobacco. He reports that he does not drink alcohol or use illicit drugs.  He is alone today.  Allergies: No Known Allergies  Current Outpatient Prescriptions  Medication Sig Dispense Refill  . bicalutamide (CASODEX) 50 MG tablet Take 1 tablet (50 mg total) by mouth daily. 90 tablet 0   No current facility-administered medications for this visit.   ROS  GENERAL:  Feels good.  No fevers.  Mild night sweats (rare).  Weight stable.   PERFORMANCE STATUS (ECOG):  1 HEENT:  No visual changes, runny nose, sore  throat, mouth sores or tenderness. Lungs: No shortness of breath or cough.  No hemoptysis. Cardiac:  No chest pain, palpitations, orthopnea, or PND. GI:  No nausea, vomiting, diarrhea, constipation, melena or hematochezia. GU:  No urgency, frequency, dysuria, or hematuria. Musculoskeletal:  No back pain.  No joint pain.  No muscle tenderness. Extremities:  No pain or swelling. Skin:  No rashes or skin changes. Neuro:  No headache, numbness or weakness.  Sometimes balance or coordination issues. Endocrine:  Once in awhile night sweats.  No diabetes, thyroid issues, or hot flashes. Psych:  No mood changes, depression or anxiety. Pain:  No focal pain. Review of systems:  All other systems reviewed and found to be negative.  Physical Exam Blood pressure 136/84, pulse 78, temperature 95 F (35 C), temperature source Tympanic, resp. rate 18, height '5\' 11"'  (1.803 m), weight 164 lb 7.4 oz (74.6 kg).  GENERAL:  Well developed, well nourished gentleman sitting comfortably in the exam room in no acute distress. MENTAL STATUS:  Alert and oriented to person, place and time. HEAD:  Wearing a gray cap.  Alopecia.  Slight gray mustache.  Normocephalic, atraumatic, face symmetric, no Cushingoid features. EYES:  Brown eyes (prominent) with slight lid lag.  Pupils equal round and reactive to light and accomodation.  No conjunctivitis or scleral icterus. ENT:  Oropharynx clear without lesion. Dentures.  Tongue normal. Mucous membranes moist. RESPIRATORY:  Clear to auscultation without rales, wheezes or rhonchi. CARDIOVASCULAR:  Regular rate and rhythm without murmur, rub or gallop. ABDOMEN:  Soft, non-tender, with active bowel sounds, and no hepatosplenomegaly.  No masses. SKIN:  No rashes, ulcers or lesions. EXTREMITIES: No edema, skin discoloration or tenderness.  No palpable cords. LYMPH NODES: No palpable cervical, supraclavicular, axillary or inguinal adenopathy  NEUROLOGICAL: Unremarkable. PSYCH:   Appropriate.  Appointment on 08/17/2015  Component Date Value Ref Range Status  . Sodium 08/17/2015 134* 135 - 145 mmol/L Final  . Potassium 08/17/2015 4.3  3.5 - 5.1 mmol/L Final  . Chloride 08/17/2015 103  101 - 111 mmol/L Final  . CO2 08/17/2015 25  22 - 32 mmol/L Final  . Glucose, Bld 08/17/2015 97  65 - 99 mg/dL Final  . BUN 08/17/2015 18  6 - 20 mg/dL Final  . Creatinine, Ser 08/17/2015 1.34* 0.61 - 1.24 mg/dL Final  . Calcium 08/17/2015 9.2  8.9 - 10.3 mg/dL Final  . Total Protein 08/17/2015 7.6  6.5 - 8.1 g/dL Final  . Albumin 08/17/2015 4.2  3.5 - 5.0 g/dL Final  . AST 08/17/2015 14* 15 - 41 U/L Final  . ALT 08/17/2015 8* 17 - 63 U/L Final  . Alkaline Phosphatase 08/17/2015 61  38 - 126 U/L Final  . Total Bilirubin 08/17/2015 0.9  0.3 - 1.2 mg/dL Final  . GFR calc non Af Amer 08/17/2015 46* >60 mL/min Final  . GFR calc Af Amer 08/17/2015 54* >60 mL/min Final   Comment: (NOTE) The eGFR has been calculated using the CKD EPI equation. This calculation has not been validated in all clinical situations. eGFR's persistently <60 mL/min signify possible Chronic Kidney Disease.   . Anion gap 08/17/2015 6  5 - 15 Final    Assessment:  The patient is a 81 year old African American gentleman with prostate cancer and a rising PSA on Lupron.  Prostate cancer was discovered approximately 20 years secondary to an elevated PSA.  He underwent biopsy at [facility] (no report available).     Initial PSA was 774.  PSA was 127.6 on 06/30/2014 and 168.3 on 12/02/2014.  Bone scan on 04/08/2014 and 12/29/2014 revealed no evidence of metastatic disease.  He denies any bone pain.  He denies any urinary symptoms.  He is on Lupron and Casodex.  He receives Lupron 45 mg every 6 months (last 06/04/2015).  PSA was 63.32 on 02/01/2015, 40.31 on 03/03/2015, 29.12 on 04/08/2015, 11.28 on 07/14/2015, and 11.02 on 08/17/2015.  He has a normocytic anemia. Work-up on 12/24/2014 revealed a  B12 deficiency. B12 was 169. Folate was 11.  He began B12 supplimentation on 02/01/2015 (last 07/14/2015).  Normal studies included a CMP, ferritin, iron studies, folate, and TSH. Diet is good. He eats meat 2 times a week. He denies any melena, hematochezia, or hematuria. Last colonoscopy was more than 10 years ago.  Symptomatically, he is doing well.  He has rare mild night sweats.  He denies bone pain.  Plan:   1.  Labs today:  CMP, PSA. 2.  B12 today.  Continue monthly. 3.  Continue Casodex. 4.  RTC on 12/03/2015 for labs (CMP) and Lupron. 5.  RTC in 3 months for MD assess, labs (CBC with diff, CMP, PSA), and B12.   Lequita Asal, MD  08/17/2015, 11:23 AM

## 2015-08-17 NOTE — Progress Notes (Signed)
Patient is here for follow-up of prostate cancer and B12 injection. Patient states that he has been doing good and offers no complaints today. Patient states that his appetite has been good.

## 2015-09-17 ENCOUNTER — Other Ambulatory Visit: Payer: Self-pay | Admitting: Hematology and Oncology

## 2015-09-17 ENCOUNTER — Inpatient Hospital Stay: Payer: Medicare Other | Attending: Hematology and Oncology

## 2015-09-17 DIAGNOSIS — C61 Malignant neoplasm of prostate: Secondary | ICD-10-CM | POA: Diagnosis not present

## 2015-09-17 DIAGNOSIS — Z79818 Long term (current) use of other agents affecting estrogen receptors and estrogen levels: Secondary | ICD-10-CM | POA: Diagnosis not present

## 2015-09-17 DIAGNOSIS — D519 Vitamin B12 deficiency anemia, unspecified: Secondary | ICD-10-CM | POA: Diagnosis not present

## 2015-09-17 MED ORDER — CYANOCOBALAMIN 1000 MCG/ML IJ SOLN
1000.0000 ug | Freq: Once | INTRAMUSCULAR | Status: AC
  Administered 2015-09-17: 1000 ug via INTRAMUSCULAR
  Filled 2015-09-17: qty 1

## 2015-09-30 ENCOUNTER — Encounter: Payer: Self-pay | Admitting: Hematology and Oncology

## 2015-10-05 DIAGNOSIS — J449 Chronic obstructive pulmonary disease, unspecified: Secondary | ICD-10-CM | POA: Diagnosis not present

## 2015-10-05 DIAGNOSIS — C61 Malignant neoplasm of prostate: Secondary | ICD-10-CM | POA: Diagnosis not present

## 2015-10-05 DIAGNOSIS — D519 Vitamin B12 deficiency anemia, unspecified: Secondary | ICD-10-CM | POA: Diagnosis not present

## 2015-10-05 DIAGNOSIS — L209 Atopic dermatitis, unspecified: Secondary | ICD-10-CM | POA: Diagnosis not present

## 2015-10-05 DIAGNOSIS — K5900 Constipation, unspecified: Secondary | ICD-10-CM | POA: Diagnosis not present

## 2015-10-18 ENCOUNTER — Inpatient Hospital Stay: Payer: Medicare Other

## 2015-10-18 ENCOUNTER — Other Ambulatory Visit: Payer: Self-pay | Admitting: Hematology and Oncology

## 2015-10-21 ENCOUNTER — Inpatient Hospital Stay: Payer: Medicare Other | Attending: Hematology and Oncology

## 2015-10-21 DIAGNOSIS — Z79818 Long term (current) use of other agents affecting estrogen receptors and estrogen levels: Secondary | ICD-10-CM | POA: Diagnosis not present

## 2015-10-21 DIAGNOSIS — R9721 Rising PSA following treatment for malignant neoplasm of prostate: Secondary | ICD-10-CM | POA: Insufficient documentation

## 2015-10-21 DIAGNOSIS — M1990 Unspecified osteoarthritis, unspecified site: Secondary | ICD-10-CM | POA: Diagnosis not present

## 2015-10-21 DIAGNOSIS — D649 Anemia, unspecified: Secondary | ICD-10-CM | POA: Diagnosis not present

## 2015-10-21 DIAGNOSIS — Z800 Family history of malignant neoplasm of digestive organs: Secondary | ICD-10-CM | POA: Insufficient documentation

## 2015-10-21 DIAGNOSIS — E785 Hyperlipidemia, unspecified: Secondary | ICD-10-CM | POA: Insufficient documentation

## 2015-10-21 DIAGNOSIS — R61 Generalized hyperhidrosis: Secondary | ICD-10-CM | POA: Diagnosis not present

## 2015-10-21 DIAGNOSIS — K219 Gastro-esophageal reflux disease without esophagitis: Secondary | ICD-10-CM | POA: Diagnosis not present

## 2015-10-21 DIAGNOSIS — I2510 Atherosclerotic heart disease of native coronary artery without angina pectoris: Secondary | ICD-10-CM | POA: Insufficient documentation

## 2015-10-21 DIAGNOSIS — C61 Malignant neoplasm of prostate: Secondary | ICD-10-CM | POA: Diagnosis not present

## 2015-10-21 DIAGNOSIS — Z803 Family history of malignant neoplasm of breast: Secondary | ICD-10-CM | POA: Insufficient documentation

## 2015-10-21 DIAGNOSIS — E538 Deficiency of other specified B group vitamins: Secondary | ICD-10-CM | POA: Diagnosis not present

## 2015-10-21 DIAGNOSIS — D519 Vitamin B12 deficiency anemia, unspecified: Secondary | ICD-10-CM

## 2015-10-21 DIAGNOSIS — Z79899 Other long term (current) drug therapy: Secondary | ICD-10-CM | POA: Insufficient documentation

## 2015-10-21 DIAGNOSIS — Z87891 Personal history of nicotine dependence: Secondary | ICD-10-CM | POA: Diagnosis not present

## 2015-10-21 MED ORDER — CYANOCOBALAMIN 1000 MCG/ML IJ SOLN
1000.0000 ug | Freq: Once | INTRAMUSCULAR | Status: AC
  Administered 2015-10-21: 1000 ug via INTRAMUSCULAR
  Filled 2015-10-21: qty 1

## 2015-11-15 ENCOUNTER — Inpatient Hospital Stay: Payer: Medicare Other

## 2015-11-15 ENCOUNTER — Inpatient Hospital Stay (HOSPITAL_BASED_OUTPATIENT_CLINIC_OR_DEPARTMENT_OTHER): Payer: Medicare Other | Admitting: Hematology and Oncology

## 2015-11-15 VITALS — BP 116/75 | HR 77 | Temp 98.0°F | Resp 20 | Wt 161.8 lb

## 2015-11-15 DIAGNOSIS — Z803 Family history of malignant neoplasm of breast: Secondary | ICD-10-CM | POA: Diagnosis not present

## 2015-11-15 DIAGNOSIS — I2510 Atherosclerotic heart disease of native coronary artery without angina pectoris: Secondary | ICD-10-CM

## 2015-11-15 DIAGNOSIS — Z87891 Personal history of nicotine dependence: Secondary | ICD-10-CM

## 2015-11-15 DIAGNOSIS — R61 Generalized hyperhidrosis: Secondary | ICD-10-CM

## 2015-11-15 DIAGNOSIS — Z79899 Other long term (current) drug therapy: Secondary | ICD-10-CM

## 2015-11-15 DIAGNOSIS — K219 Gastro-esophageal reflux disease without esophagitis: Secondary | ICD-10-CM | POA: Diagnosis not present

## 2015-11-15 DIAGNOSIS — E785 Hyperlipidemia, unspecified: Secondary | ICD-10-CM

## 2015-11-15 DIAGNOSIS — C61 Malignant neoplasm of prostate: Secondary | ICD-10-CM

## 2015-11-15 DIAGNOSIS — D649 Anemia, unspecified: Secondary | ICD-10-CM

## 2015-11-15 DIAGNOSIS — M1990 Unspecified osteoarthritis, unspecified site: Secondary | ICD-10-CM

## 2015-11-15 DIAGNOSIS — D519 Vitamin B12 deficiency anemia, unspecified: Secondary | ICD-10-CM

## 2015-11-15 DIAGNOSIS — Z79818 Long term (current) use of other agents affecting estrogen receptors and estrogen levels: Secondary | ICD-10-CM

## 2015-11-15 DIAGNOSIS — E538 Deficiency of other specified B group vitamins: Secondary | ICD-10-CM

## 2015-11-15 DIAGNOSIS — R9721 Rising PSA following treatment for malignant neoplasm of prostate: Secondary | ICD-10-CM

## 2015-11-15 DIAGNOSIS — Z800 Family history of malignant neoplasm of digestive organs: Secondary | ICD-10-CM | POA: Diagnosis not present

## 2015-11-15 LAB — CBC WITH DIFFERENTIAL/PLATELET
Basophils Absolute: 0 10*3/uL (ref 0–0.1)
Basophils Relative: 1 %
Eosinophils Absolute: 0.2 10*3/uL (ref 0–0.7)
Eosinophils Relative: 6 %
HCT: 35.8 % — ABNORMAL LOW (ref 40.0–52.0)
Hemoglobin: 12.4 g/dL — ABNORMAL LOW (ref 13.0–18.0)
Lymphocytes Relative: 29 %
Lymphs Abs: 1 10*3/uL (ref 1.0–3.6)
MCH: 33.6 pg (ref 26.0–34.0)
MCHC: 34.7 g/dL (ref 32.0–36.0)
MCV: 96.9 fL (ref 80.0–100.0)
Monocytes Absolute: 0.5 10*3/uL (ref 0.2–1.0)
Monocytes Relative: 14 %
Neutro Abs: 1.8 10*3/uL (ref 1.4–6.5)
Neutrophils Relative %: 50 %
Platelets: 180 10*3/uL (ref 150–440)
RBC: 3.7 MIL/uL — ABNORMAL LOW (ref 4.40–5.90)
RDW: 13.4 % (ref 11.5–14.5)
WBC: 3.5 10*3/uL — ABNORMAL LOW (ref 3.8–10.6)

## 2015-11-15 LAB — COMPREHENSIVE METABOLIC PANEL
ALT: 7 U/L — ABNORMAL LOW (ref 17–63)
AST: 16 U/L (ref 15–41)
Albumin: 4.2 g/dL (ref 3.5–5.0)
Alkaline Phosphatase: 69 U/L (ref 38–126)
Anion gap: 6 (ref 5–15)
BUN: 17 mg/dL (ref 6–20)
CO2: 25 mmol/L (ref 22–32)
Calcium: 8.9 mg/dL (ref 8.9–10.3)
Chloride: 105 mmol/L (ref 101–111)
Creatinine, Ser: 1.45 mg/dL — ABNORMAL HIGH (ref 0.61–1.24)
GFR calc Af Amer: 49 mL/min — ABNORMAL LOW (ref >60)
GFR calc non Af Amer: 42 mL/min — ABNORMAL LOW (ref >60)
Glucose, Bld: 98 mg/dL (ref 65–99)
Potassium: 4.4 mmol/L (ref 3.5–5.1)
Sodium: 136 mmol/L (ref 135–145)
Total Bilirubin: 0.8 mg/dL (ref 0.3–1.2)
Total Protein: 7.7 g/dL (ref 6.5–8.1)

## 2015-11-15 LAB — PSA: PSA: 10.35 ng/mL — ABNORMAL HIGH (ref 0.00–4.00)

## 2015-11-15 MED ORDER — CYANOCOBALAMIN 1000 MCG/ML IJ SOLN
1000.0000 ug | Freq: Once | INTRAMUSCULAR | Status: AC
  Administered 2015-11-15: 1000 ug via INTRAMUSCULAR
  Filled 2015-11-15: qty 1

## 2015-11-15 NOTE — Progress Notes (Signed)
[facility]-  Cancer Center  Clinic day:  11/15/2015  Chief Complaint: Charles Flores is an 82 y.o. male with prostate cancer and B12 deficiency who is seen for 3 month assessment on Casodex and continuation of B12 monthly.  HPI: The patient was last seen in the medical oncology clinic on 08/17/2015.  At that time,  he was doing well.  He had rare mild night sweats.  He denied bone pain.  PSA was 11.02.  He received his B12.  He continued his Casodex.  He has continued his monthly B12 (last given 10/21/2015).  During the interim, he notes no real concerns.  He continues his Casodex.  He receives Lupron with his urologist.  Once in awhile he has night sweats.  Past Medical History  Diagnosis Date  . Prostate cancer ([facility])   . Arthritis   . Cataract   . GERD (gastroesophageal reflux disease)   . Anemia 12/24/2014  . Hyperlipidemia   . Hyperlipidemia   . Coronary artery disease   . B12 deficiency anemia 01/01/2015    Past Surgical History  Procedure Laterality Date  . Prostate biopsy      Family History  Problem Relation Age of Onset  . Cancer Mother   . Breast cancer Mother   . Kidney disease Father   . Diabetes Sister   . Stroke Sister   . Gastric cancer Sister     Social History:  reports that he has quit smoking. His smoking use included Cigarettes. He has a 7.5 pack-year smoking history. He has never used smokeless tobacco. He reports that he does not drink alcohol or use illicit drugs.  He is alone today.  Allergies: No Known Allergies  Current Outpatient Prescriptions  Medication Sig Dispense Refill  . bicalutamide (CASODEX) 50 MG tablet Take 1 tablet (50 mg total) by mouth daily. 90 tablet 0   No current facility-administered medications for this visit.   ROS  GENERAL:  Feels good.  No fevers.  Mild night sweats (rare).  Weight down 3 pounds.   PERFORMANCE STATUS (ECOG):  1 HEENT:  No visual changes, runny nose, sore throat, mouth sores or  tenderness. Lungs: No shortness of breath or cough.  No hemoptysis. Cardiac:  No chest pain, palpitations, orthopnea, or PND. GI:  No nausea, vomiting, diarrhea, constipation, melena or hematochezia. GU:  No urgency, frequency, dysuria, or hematuria. Musculoskeletal:  No back pain.  No joint pain.  No muscle tenderness. Extremities:  No pain or swelling. Skin:  No rashes or skin changes. Neuro:  No headache, numbness or weakness.  Sometimes balance or coordination issues. Endocrine:  Once in awhile night sweats.  No diabetes, thyroid issues, or hot flashes. Psych:  No mood changes, depression or anxiety. Pain:  No focal pain. Review of systems:  All other systems reviewed and found to be negative.  Physical Exam Blood pressure 116/75, pulse 77, temperature 98 F (36.7 C), temperature source Tympanic, resp. rate 20, weight 161 lb 13.1 oz (73.4 kg).  GENERAL:  Well developed, well nourished gentleman sitting comfortably in the exam room in no acute distress. MENTAL STATUS:  Alert and oriented to person, place and time. HEAD:  Alopecia.  Slight gray mustache.  Normocephalic, atraumatic, face symmetric, no Cushingoid features. EYES:  Brown eyes (prominent) with slight lid lag.  Pupils equal round and reactive to light and accomodation.  No conjunctivitis or scleral icterus. ENT:  Oropharynx clear without lesion. Dentures.  Tongue normal. Mucous membranes moist. RESPIRATORY:    Clear to auscultation without rales, wheezes or rhonchi. CARDIOVASCULAR:  Regular rate and rhythm without murmur, rub or gallop. ABDOMEN:  Soft, non-tender, with active bowel sounds, and no hepatosplenomegaly.  No masses. SKIN:  No rashes, ulcers or lesions. EXTREMITIES: No edema, skin discoloration or tenderness.  No palpable cords. LYMPH NODES: No palpable cervical, supraclavicular, axillary or inguinal adenopathy  NEUROLOGICAL: Unremarkable. PSYCH:  Appropriate.  Appointment on 11/15/2015  Component Date Value Ref  Range Status  . WBC 11/15/2015 3.5* 3.8 - 10.6 K/uL Final  . RBC 11/15/2015 3.70* 4.40 - 5.90 MIL/uL Final  . Hemoglobin 11/15/2015 12.4* 13.0 - 18.0 g/dL Final  . HCT 11/15/2015 35.8* 40.0 - 52.0 % Final  . MCV 11/15/2015 96.9  80.0 - 100.0 fL Final  . MCH 11/15/2015 33.6  26.0 - 34.0 pg Final  . MCHC 11/15/2015 34.7  32.0 - 36.0 g/dL Final  . RDW 11/15/2015 13.4  11.5 - 14.5 % Final  . Platelets 11/15/2015 180  150 - 440 K/uL Final  . Neutrophils Relative % 11/15/2015 50   Final  . Neutro Abs 11/15/2015 1.8  1.4 - 6.5 K/uL Final  . Lymphocytes Relative 11/15/2015 29   Final  . Lymphs Abs 11/15/2015 1.0  1.0 - 3.6 K/uL Final  . Monocytes Relative 11/15/2015 14   Final  . Monocytes Absolute 11/15/2015 0.5  0.2 - 1.0 K/uL Final  . Eosinophils Relative 11/15/2015 6   Final  . Eosinophils Absolute 11/15/2015 0.2  0 - 0.7 K/uL Final  . Basophils Relative 11/15/2015 1   Final  . Basophils Absolute 11/15/2015 0.0  0 - 0.1 K/uL Final  . Sodium 11/15/2015 136  135 - 145 mmol/L Final  . Potassium 11/15/2015 4.4  3.5 - 5.1 mmol/L Final  . Chloride 11/15/2015 105  101 - 111 mmol/L Final  . CO2 11/15/2015 25  22 - 32 mmol/L Final  . Glucose, Bld 11/15/2015 98  65 - 99 mg/dL Final  . BUN 11/15/2015 17  6 - 20 mg/dL Final  . Creatinine, Ser 11/15/2015 1.45* 0.61 - 1.24 mg/dL Final  . Calcium 11/15/2015 8.9  8.9 - 10.3 mg/dL Final  . Total Protein 11/15/2015 7.7  6.5 - 8.1 g/dL Final  . Albumin 11/15/2015 4.2  3.5 - 5.0 g/dL Final  . AST 11/15/2015 16  15 - 41 U/L Final  . ALT 11/15/2015 7* 17 - 63 U/L Final  . Alkaline Phosphatase 11/15/2015 69  38 - 126 U/L Final  . Total Bilirubin 11/15/2015 0.8  0.3 - 1.2 mg/dL Final  . GFR calc non Af Amer 11/15/2015 42* >60 mL/min Final  . GFR calc Af Amer 11/15/2015 49* >60 mL/min Final   Comment: (NOTE) The eGFR has been calculated using the CKD EPI equation. This calculation has not been validated in all clinical situations. eGFR's persistently <60  mL/min signify possible Chronic Kidney Disease.   . Anion gap 11/15/2015 6  5 - 15 Final    Assessment:  The patient is a 82 year old African American gentleman with prostate cancer and a rising PSA on Lupron.  Prostate cancer was discovered approximately 20 years secondary to an elevated PSA.  He underwent biopsy at [facility] (no report available).   Initial PSA was 774.  PSA was 127.6 on 06/30/2014 and 168.3 on 12/02/2014.  Bone scan on 04/08/2014 and 12/29/2014 revealed no evidence of metastatic disease.  He denies any bone pain.  He denies any urinary symptoms.  He is on   Lupron and Casodex.  He receives Lupron 45 mg every 6 months (last 06/04/2015).  PSA was 63.32 on 02/01/2015, 40.31 on 03/03/2015, 29.12 on 04/08/2015, 11.28 on 07/14/2015, 11.02 on 08/17/2015, and 10.35 on 11/15/2015.  He has a normocytic anemia. Work-up on 12/24/2014 revealed a B12 deficiency. B12 was 169. Folate was 11.  He began B12 supplimentation on 02/01/2015 (last 10/21/2015).  Normal studies included a CMP, ferritin, iron studies, folate, and TSH. Diet is good. He eats meat 2 times a week. He denies any melena, hematochezia, or hematuria. Last colonoscopy was more than 10 years ago.  Symptomatically, he is doing well.  He has rare mild night sweats.  He denies bone pain.  Plan:   1.  Labs today:  CBC with diff, CMP, PSA. 2.  B12 today.  Continue monthly. 3.  Continue Casodex. 4.  Follow-up with urology as scheduled for Lupron (due 12/03/2015). 5.  RTC in 3 months for MD assess, labs (CBC with diff, CMP, PSA), and B12.   Lequita Asal, MD  11/15/2015, 11:19 AM

## 2015-11-15 NOTE — Progress Notes (Signed)
Patient here today for follow up visit, B12 injection. Patient denies any concerns today.

## 2015-11-21 ENCOUNTER — Encounter: Payer: Self-pay | Admitting: Hematology and Oncology

## 2015-12-04 ENCOUNTER — Other Ambulatory Visit: Payer: Self-pay | Admitting: Hematology and Oncology

## 2015-12-14 ENCOUNTER — Ambulatory Visit: Payer: Self-pay

## 2015-12-16 ENCOUNTER — Inpatient Hospital Stay: Payer: Medicare Other

## 2015-12-20 ENCOUNTER — Inpatient Hospital Stay: Payer: Medicare Other | Attending: Hematology and Oncology

## 2015-12-20 ENCOUNTER — Other Ambulatory Visit: Payer: Self-pay | Admitting: Hematology and Oncology

## 2015-12-20 DIAGNOSIS — C61 Malignant neoplasm of prostate: Secondary | ICD-10-CM | POA: Insufficient documentation

## 2015-12-20 DIAGNOSIS — D519 Vitamin B12 deficiency anemia, unspecified: Secondary | ICD-10-CM

## 2015-12-20 DIAGNOSIS — Z79818 Long term (current) use of other agents affecting estrogen receptors and estrogen levels: Secondary | ICD-10-CM | POA: Insufficient documentation

## 2015-12-20 MED ORDER — CYANOCOBALAMIN 1000 MCG/ML IJ SOLN
1000.0000 ug | Freq: Once | INTRAMUSCULAR | Status: AC
  Administered 2015-12-20: 1000 ug via INTRAMUSCULAR
  Filled 2015-12-20: qty 1

## 2015-12-23 DIAGNOSIS — H40003 Preglaucoma, unspecified, bilateral: Secondary | ICD-10-CM | POA: Diagnosis not present

## 2015-12-27 ENCOUNTER — Ambulatory Visit (INDEPENDENT_AMBULATORY_CARE_PROVIDER_SITE_OTHER): Payer: Medicare Other | Admitting: Urology

## 2015-12-27 DIAGNOSIS — C61 Malignant neoplasm of prostate: Secondary | ICD-10-CM

## 2015-12-27 MED ORDER — LEUPROLIDE ACETATE (6 MONTH) 45 MG IM KIT
45.0000 mg | PACK | Freq: Once | INTRAMUSCULAR | Status: AC
  Administered 2015-12-27: 45 mg via INTRAMUSCULAR

## 2015-12-27 NOTE — Progress Notes (Signed)
Lupron IM Injection   Due to Prostate Cancer patient is present today for a Lupron Injection.  Medication: Lupron 6 month Dose: 45 mg  Location: right upper outer buttocks Lot: [id number] Exp: Sept. 6, 2018  Patient tolerated well, no complications were noted  Performed by: Toniann Fail, LPN

## 2015-12-28 ENCOUNTER — Ambulatory Visit: Payer: Self-pay

## 2015-12-28 DIAGNOSIS — H40003 Preglaucoma, unspecified, bilateral: Secondary | ICD-10-CM | POA: Diagnosis not present

## 2015-12-31 ENCOUNTER — Ambulatory Visit: Payer: Self-pay

## 2016-01-04 ENCOUNTER — Encounter: Payer: Self-pay | Admitting: Urology

## 2016-01-04 ENCOUNTER — Ambulatory Visit (INDEPENDENT_AMBULATORY_CARE_PROVIDER_SITE_OTHER): Payer: Medicare Other | Admitting: Urology

## 2016-01-04 VITALS — BP 120/71 | HR 79 | Ht 71.0 in | Wt 158.4 lb

## 2016-01-04 DIAGNOSIS — C61 Malignant neoplasm of prostate: Secondary | ICD-10-CM

## 2016-01-04 NOTE — Progress Notes (Signed)
1:51 PM   Ansen Ocasio 04-Apr-1927 [id number]  Referring provider: Christie Nottingham, [address], [place] [address]  Chief Complaint  Patient presents with  . Follow-up    prostate cancer    HPI:  1 - Advanced Prostate Cancer - long h/o prostate cancer on androgen deprivation for years. Prostate still in situ. PSA at initial DX over 700.  Recent Course: 11/2014 PSA 168 ==> started casodex in addition to lupron, bone scan and CT w/o gross mets 03/2015 PSA 29.12; 05/2015 Lupron 45mg  08/17/15: 11.02 11/15/15: 10.35 Lupron injection 45 mg, 12/28/15  Today "Ed" is seen in f/u above and lupron injection. No interval bone pain, obstructive urinary symptoms, or hematuria.     PMH: Past Medical History  Diagnosis Date  . Prostate cancer ([place])   . Arthritis   . Cataract   . GERD (gastroesophageal reflux disease)   . Anemia 12/24/2014  . Hyperlipidemia   . Hyperlipidemia   . Coronary artery disease   . B12 deficiency anemia 01/01/2015    Surgical History: Past Surgical History  Procedure Laterality Date  . Prostate biopsy      Home Medications:    Medication List       This list is accurate as of: 01/04/16  1:51 PM.  Always use your most recent med list.               bicalutamide 50 MG tablet  Commonly known as:  CASODEX  TAKE 1 TABLET (50 MG TOTAL) BY MOUTH DAILY.        Allergies: No Known Allergies  Family History: Family History  Problem Relation Age of Onset  . Cancer Mother   . Breast cancer Mother   . Kidney disease Father   . Diabetes Sister   . Stroke Sister   . Gastric cancer Sister     Social History:  reports that he has quit smoking. His smoking use included Cigarettes. He has a 7.5 pack-year smoking history. He has never used smokeless tobacco. He reports that he does not drink alcohol or use illicit drugs.  ROS: UROLOGY Frequent Urination?: No Hard to postpone urination?: No Burning/pain with urination?: No Get up at night to  urinate?: No Leakage of urine?: No Urine stream starts and stops?: No Trouble starting stream?: No Do you have to strain to urinate?: No Blood in urine?: No Urinary tract infection?: No Sexually transmitted disease?: No Injury to kidneys or bladder?: No Painful intercourse?: No Weak stream?: No Erection problems?: No Penile pain?: No  Gastrointestinal Nausea?: No Vomiting?: No Indigestion/heartburn?: No Diarrhea?: No Constipation?: Yes  Constitutional Fever: No Night sweats?: Yes Weight loss?: Yes Fatigue?: Yes  Skin Skin rash/lesions?: Yes Itching?: Yes  Eyes Blurred vision?: Yes Double vision?: No  Ears/Nose/Throat Sore throat?: No Sinus problems?: No  Hematologic/Lymphatic Swollen glands?: No Easy bruising?: No  Cardiovascular Leg swelling?: No Chest pain?: No  Respiratory Cough?: No Shortness of breath?: No  Endocrine Excessive thirst?: No  Musculoskeletal Back pain?: No Joint pain?: No  Neurological Headaches?: No Dizziness?: No  Psychologic Depression?: No Anxiety?: No  Physical Exam: BP 120/71 mmHg  Pulse 79  Ht 5\' 11"  (1.803 m)  Wt 158 lb 6.4 oz (71.85 kg)  BMI 22.10 kg/m2  Constitutional:  Alert and oriented, No acute distress. HEENT: [place] AT, moist mucus membranes.  Trachea midline, no masses. Cardiovascular: No clubbing, cyanosis, or edema. Respiratory: Normal respiratory effort, no increased work of breathing. GI: Abdomen is soft, nontender,  nondistended, no abdominal masses GU: No CVA tenderness.   Skin: No rashes, bruises or suspicious lesions. Lymph: No cervical or inguinal adenopathy. Neurologic: Grossly intact, no focal deficits, moving all 4 extremities. Psychiatric: Normal mood and affect.  Laboratory Data: Lab Results  Component Value Date   WBC 3.5* 11/15/2015   HGB 12.4* 11/15/2015   HCT 35.8* 11/15/2015   MCV 96.9 11/15/2015   PLT 180 11/15/2015    Lab Results  Component Value Date   CREATININE 1.45*  11/15/2015    Lab Results  Component Value Date   PSA 10.35* 11/15/2015   PSA 11.02* 08/17/2015   PSA 11.28* 07/14/2015    No results found for: TESTOSTERONE  No results found for: HGBA1C  Urinalysis No results found for: COLORURINE, APPEARANCEUR, LABSPEC, PHURINE, GLUCOSEU, HGBUR, BILIRUBINUR, KETONESUR, PROTEINUR, UROBILINOGEN, NITRITE, LEUKOCYTESUR   Assessment & Plan:    1. Advanced Prostate Cancer - the patient's cancer appears to be at [place], his last PSA was 8 weeks prior, but was stable. He had a conflict with a second doctor's appointment on the scheduled time from his Lupron injection and as such receive this Lupron injection almost 8 weeks late.  The patient has otherwise been doing very well with no progression of his voiding symptoms and no new back or bone pain.  I discussed with the patient today the risk of osteoporosis while on long-term androgen deprivation therapy. I've given the patient recommendation of 1200 mg of calcium supplement as well as 2000 international units of vitamin D 3. Further, I recommended that the patient return with a DEXA scan prior in 6 months. At that point we will obtain a PSA and testosterone. He will need his Lupron injection on that day.  2 - RTC 22mos with PSA,T prior.  Return in about 6 months (around 07/06/2016).  Ardis Hughs, [facility] [address], [place], [place] [address] [phone]   Cc: Dr. Nolon Stalls, MD

## 2016-01-14 ENCOUNTER — Inpatient Hospital Stay: Payer: Medicare Other

## 2016-02-15 ENCOUNTER — Inpatient Hospital Stay (HOSPITAL_BASED_OUTPATIENT_CLINIC_OR_DEPARTMENT_OTHER): Payer: Medicare Other | Admitting: Hematology and Oncology

## 2016-02-15 ENCOUNTER — Encounter: Payer: Self-pay | Admitting: Hematology and Oncology

## 2016-02-15 ENCOUNTER — Other Ambulatory Visit: Payer: Self-pay | Admitting: Hematology and Oncology

## 2016-02-15 ENCOUNTER — Inpatient Hospital Stay: Payer: Medicare Other

## 2016-02-15 ENCOUNTER — Inpatient Hospital Stay: Payer: Medicare Other | Attending: Hematology and Oncology

## 2016-02-15 VITALS — BP 130/82 | HR 77 | Temp 94.4°F | Resp 16 | Ht 71.0 in | Wt 154.8 lb

## 2016-02-15 DIAGNOSIS — Z79818 Long term (current) use of other agents affecting estrogen receptors and estrogen levels: Secondary | ICD-10-CM | POA: Diagnosis not present

## 2016-02-15 DIAGNOSIS — R634 Abnormal weight loss: Secondary | ICD-10-CM

## 2016-02-15 DIAGNOSIS — Z79899 Other long term (current) drug therapy: Secondary | ICD-10-CM | POA: Diagnosis not present

## 2016-02-15 DIAGNOSIS — M1990 Unspecified osteoarthritis, unspecified site: Secondary | ICD-10-CM | POA: Diagnosis not present

## 2016-02-15 DIAGNOSIS — D519 Vitamin B12 deficiency anemia, unspecified: Secondary | ICD-10-CM

## 2016-02-15 DIAGNOSIS — E785 Hyperlipidemia, unspecified: Secondary | ICD-10-CM | POA: Diagnosis not present

## 2016-02-15 DIAGNOSIS — C61 Malignant neoplasm of prostate: Secondary | ICD-10-CM | POA: Diagnosis not present

## 2016-02-15 DIAGNOSIS — I2510 Atherosclerotic heart disease of native coronary artery without angina pectoris: Secondary | ICD-10-CM | POA: Insufficient documentation

## 2016-02-15 DIAGNOSIS — R61 Generalized hyperhidrosis: Secondary | ICD-10-CM

## 2016-02-15 DIAGNOSIS — Z87891 Personal history of nicotine dependence: Secondary | ICD-10-CM | POA: Diagnosis not present

## 2016-02-15 LAB — COMPREHENSIVE METABOLIC PANEL
ALT: 8 U/L — ABNORMAL LOW (ref 17–63)
AST: 15 U/L (ref 15–41)
Albumin: 4 g/dL (ref 3.5–5.0)
Alkaline Phosphatase: 68 U/L (ref 38–126)
Anion gap: 7 (ref 5–15)
BUN: 24 mg/dL — ABNORMAL HIGH (ref 6–20)
CO2: 26 mmol/L (ref 22–32)
Calcium: 9.2 mg/dL (ref 8.9–10.3)
Chloride: 104 mmol/L (ref 101–111)
Creatinine, Ser: 1.37 mg/dL — ABNORMAL HIGH (ref 0.61–1.24)
GFR calc Af Amer: 52 mL/min — ABNORMAL LOW (ref >60)
GFR calc non Af Amer: 45 mL/min — ABNORMAL LOW (ref >60)
Glucose, Bld: 102 mg/dL — ABNORMAL HIGH (ref 65–99)
Potassium: 4.2 mmol/L (ref 3.5–5.1)
Sodium: 137 mmol/L (ref 135–145)
Total Bilirubin: 0.7 mg/dL (ref 0.3–1.2)
Total Protein: 7.6 g/dL (ref 6.5–8.1)

## 2016-02-15 LAB — CBC WITH DIFFERENTIAL/PLATELET
Basophils Absolute: 0 10*3/uL (ref 0–0.1)
Basophils Relative: 1 %
Eosinophils Absolute: 0.2 10*3/uL (ref 0–0.7)
Eosinophils Relative: 6 %
HCT: 34.1 % — ABNORMAL LOW (ref 40.0–52.0)
Hemoglobin: 11.7 g/dL — ABNORMAL LOW (ref 13.0–18.0)
Lymphocytes Relative: 26 %
Lymphs Abs: 1 10*3/uL (ref 1.0–3.6)
MCH: 33.1 pg (ref 26.0–34.0)
MCHC: 34.2 g/dL (ref 32.0–36.0)
MCV: 96.8 fL (ref 80.0–100.0)
Monocytes Absolute: 0.4 10*3/uL (ref 0.2–1.0)
Monocytes Relative: 10 %
Neutro Abs: 2.2 10*3/uL (ref 1.4–6.5)
Neutrophils Relative %: 57 %
Platelets: 211 10*3/uL (ref 150–440)
RBC: 3.52 MIL/uL — ABNORMAL LOW (ref 4.40–5.90)
RDW: 13.7 % (ref 11.5–14.5)
WBC: 3.9 10*3/uL (ref 3.8–10.6)

## 2016-02-15 LAB — PSA: PSA: 14.37 ng/mL — ABNORMAL HIGH (ref 0.00–4.00)

## 2016-02-15 LAB — T4, FREE: Free T4: 0.81 ng/dL (ref 0.61–1.12)

## 2016-02-15 LAB — TSH: TSH: 1.807 u[IU]/mL (ref 0.350–4.500)

## 2016-02-15 MED ORDER — CYANOCOBALAMIN 1000 MCG/ML IJ SOLN
1000.0000 ug | Freq: Once | INTRAMUSCULAR | Status: AC
  Administered 2016-02-15: 1000 ug via INTRAMUSCULAR
  Filled 2016-02-15: qty 1

## 2016-02-15 NOTE — Progress Notes (Signed)
[facility] day:  02/15/2016  Chief Complaint: Charles Flores is an 82 y.o. male with prostate cancer and B12 deficiency who is seen for 3 month assessment on Casodex and continuation of B12 monthly.  HPI: The patient was last seen in the medical oncology clinic on 11/15/2015.  At that time, he was doing well.  He noted rare mild night sweats.  He denied bone pain.  PSA was 10.35.   He received his B12.  He continued his Casodex.  He has continued his monthly B12 (last given 12/20/2015).  He received Lupron 45 mg on 12/27/2015 through urology.  During the interim, he denies any complaints.  He denies bone pain.  He continues his Casodex.  Once in awhile he has night sweats.  He notes that he is eating about the same, maybe more.   Past Medical History  Diagnosis Date  . Prostate cancer ([place])   . Arthritis   . Cataract   . GERD (gastroesophageal reflux disease)   . Anemia 12/24/2014  . Hyperlipidemia   . Hyperlipidemia   . Coronary artery disease   . B12 deficiency anemia 01/01/2015    Past Surgical History  Procedure Laterality Date  . Prostate biopsy      Family History  Problem Relation Age of Onset  . Cancer Mother   . Breast cancer Mother   . Kidney disease Father   . Diabetes Sister   . Stroke Sister   . Gastric cancer Sister     Social History:  reports that he has quit smoking. His smoking use included Cigarettes. He has a 7.5 pack-year smoking history. He has never used smokeless tobacco. He reports that he does not drink alcohol or use illicit drugs.  He is alone today.  Allergies: No Known Allergies  Current Outpatient Prescriptions  Medication Sig Dispense Refill  . bicalutamide (CASODEX) 50 MG tablet TAKE 1 TABLET (50 MG TOTAL) BY MOUTH DAILY. 90 tablet 0   No current facility-administered medications for this visit.   ROS  GENERAL:  Feels good.  No fevers.  Mild night sweats (rare).  Weight down 7 pounds in 3  moths (10 pounds in 6 months).   PERFORMANCE STATUS (ECOG):  1 HEENT:  No visual changes, runny nose, sore throat, mouth sores or tenderness. Lungs: No shortness of breath or cough.  No hemoptysis. Cardiac:  No chest pain, palpitations, orthopnea, or PND. GI:  No nausea, vomiting, diarrhea, constipation, melena or hematochezia. GU:  No urgency, frequency, dysuria, or hematuria. Musculoskeletal:  No back pain.  No joint pain.  No muscle tenderness. Extremities:  No pain or swelling. Skin:  No rashes or skin changes. Neuro:  No headache, numbness or weakness.  Sometimes balance or coordination issues. Endocrine:  Once in awhile night sweats.  No diabetes, thyroid issues, or hot flashes. Psych:  No mood changes, depression or anxiety. Pain:  No focal pain. Review of systems:  All other systems reviewed and found to be negative.  Physical Exam Blood pressure 130/82, pulse 77, temperature 94.4 F (34.7 C), temperature source Tympanic, resp. rate 16, height '5\' 11"'  (1.803 m), weight 154 lb 12.2 oz (70.2 kg).  GENERAL:  Well developed, well nourished gentleman sitting comfortably in the exam room in no acute distress. MENTAL STATUS:  Alert and oriented to person, place and time. HEAD:  Alopecia.  Slight gray mustache.  Normocephalic, atraumatic, face symmetric, no Cushingoid features. EYES:  Brown eyes (prominent) with slight  lid lag.  Pupils equal round and reactive to light and accomodation.  No conjunctivitis or scleral icterus. ENT:  Oropharynx clear without lesion. Dentures.  Tongue normal. Mucous membranes moist. RESPIRATORY:  Clear to auscultation without rales, wheezes or rhonchi. CARDIOVASCULAR:  Regular rate and rhythm without murmur, rub or gallop. ABDOMEN:  Soft, non-tender, with active bowel sounds, and no hepatosplenomegaly.  No masses. SKIN:  No rashes, ulcers or lesions. EXTREMITIES: No edema, skin discoloration or tenderness.  No palpable cords. LYMPH NODES: No palpable  cervical, supraclavicular, axillary or inguinal adenopathy  NEUROLOGICAL: Unremarkable. PSYCH:  Appropriate.  Appointment on 02/15/2016  Component Date Value Ref Range Status  . WBC 02/15/2016 3.9  3.8 - 10.6 K/uL Final  . RBC 02/15/2016 3.52* 4.40 - 5.90 MIL/uL Final  . Hemoglobin 02/15/2016 11.7* 13.0 - 18.0 g/dL Final  . HCT 02/15/2016 34.1* 40.0 - 52.0 % Final  . MCV 02/15/2016 96.8  80.0 - 100.0 fL Final  . MCH 02/15/2016 33.1  26.0 - 34.0 pg Final  . MCHC 02/15/2016 34.2  32.0 - 36.0 g/dL Final  . RDW 02/15/2016 13.7  11.5 - 14.5 % Final  . Platelets 02/15/2016 211  150 - 440 K/uL Final  . Neutrophils Relative % 02/15/2016 57   Final  . Neutro Abs 02/15/2016 2.2  1.4 - 6.5 K/uL Final  . Lymphocytes Relative 02/15/2016 26   Final  . Lymphs Abs 02/15/2016 1.0  1.0 - 3.6 K/uL Final  . Monocytes Relative 02/15/2016 10   Final  . Monocytes Absolute 02/15/2016 0.4  0.2 - 1.0 K/uL Final  . Eosinophils Relative 02/15/2016 6   Final  . Eosinophils Absolute 02/15/2016 0.2  0 - 0.7 K/uL Final  . Basophils Relative 02/15/2016 1   Final  . Basophils Absolute 02/15/2016 0.0  0 - 0.1 K/uL Final  . Sodium 02/15/2016 137  135 - 145 mmol/L Final  . Potassium 02/15/2016 4.2  3.5 - 5.1 mmol/L Final  . Chloride 02/15/2016 104  101 - 111 mmol/L Final  . CO2 02/15/2016 26  22 - 32 mmol/L Final  . Glucose, Bld 02/15/2016 102* 65 - 99 mg/dL Final  . BUN 02/15/2016 24* 6 - 20 mg/dL Final  . Creatinine, Ser 02/15/2016 1.37* 0.61 - 1.24 mg/dL Final  . Calcium 02/15/2016 9.2  8.9 - 10.3 mg/dL Final  . Total Protein 02/15/2016 7.6  6.5 - 8.1 g/dL Final  . Albumin 02/15/2016 4.0  3.5 - 5.0 g/dL Final  . AST 02/15/2016 15  15 - 41 U/L Final  . ALT 02/15/2016 8* 17 - 63 U/L Final  . Alkaline Phosphatase 02/15/2016 68  38 - 126 U/L Final  . Total Bilirubin 02/15/2016 0.7  0.3 - 1.2 mg/dL Final  . GFR calc non Af Amer 02/15/2016 45* >60 mL/min Final  . GFR calc Af Amer 02/15/2016 52* >60 mL/min Final    Comment: (NOTE) The eGFR has been calculated using the CKD EPI equation. This calculation has not been validated in all clinical situations. eGFR's persistently <60 mL/min signify possible Chronic Kidney Disease.   . Anion gap 02/15/2016 7  5 - 15 Final    Assessment:  The patient is a 82 year old African American gentleman with prostate cancer and a rising PSA on Lupron.  Prostate cancer was discovered approximately 20 years secondary to an elevated PSA.  He underwent biopsy at [facility] (no report available).   Initial PSA was 774.  PSA was 127.6 on 06/30/2014 and  168.3 on 12/02/2014.  Abdomen and pelvic CT on 01/05/2006 revealed an enlarged and heterogeneous prostate with mass effect on the base of the bladder.  There was no adenopathy.  Bone scan on 04/08/2014 and 12/29/2014 revealed no evidence of metastatic disease.  He denies any bone pain.  He denies any urinary symptoms.  He is on Lupron and Casodex.  He receives Lupron 45 mg every 6 months (last 12/27/2015).  PSA was 63.32 on 02/01/2015, 40.31 on 03/03/2015, 29.12 on 04/08/2015, 11.28 on 07/14/2015, 11.02 on 08/17/2015, 10.35 on 11/15/2015, and 14.37 on 02/15/2016.  He has a normocytic anemia. Work-up on 12/24/2014 revealed a B12 deficiency. B12 was 169. Folate was 11.  He began B12 supplimentation on 02/01/2015 (last 10/21/2015).  Normal studies included a CMP, ferritin, iron studies, folate, and TSH. Diet is good. He eats meat 2 times a week. He denies any melena, hematochezia, or hematuria. Last colonoscopy was more than 10 years ago.  Symptomatically, he has rare mild night sweats.  He denies bone pain. He has lost 10 pounds in the past 6 months.  Plan:   1.  Labs today:  CBC with diff, CMP, PSA. 2.  Add TSH, free T4 secondary to weight loss. 3.  B12 today.  Continue monthly. 4.  Continue Casodex. 5.  Schedule yearly bone scan: restaging. 6.  RTC after bone scan.  Addendum:  Consider restaging  abdominal and pelvic CT scan if change in therapy pursued.   Lequita Asal, MD  02/15/2016, 10:59 AM

## 2016-02-15 NOTE — Progress Notes (Signed)
Pt reports nothing has changed since last visit.  Pt reports constipation he takes medication for unaware of name.  SOB on exertion.  Down in weight but has been working in garden and has good appetite.

## 2016-02-20 ENCOUNTER — Encounter: Payer: Self-pay | Admitting: Hematology and Oncology

## 2016-02-23 ENCOUNTER — Ambulatory Visit
Admission: RE | Admit: 2016-02-23 | Discharge: 2016-02-23 | Disposition: A | Discharge status: Home / Self Care | Payer: Medicare Other | Source: Ambulatory Visit | Attending: Hematology and Oncology | Admitting: Hematology and Oncology

## 2016-02-23 DIAGNOSIS — R634 Abnormal weight loss: Secondary | ICD-10-CM | POA: Diagnosis not present

## 2016-02-23 DIAGNOSIS — D519 Vitamin B12 deficiency anemia, unspecified: Secondary | ICD-10-CM | POA: Diagnosis not present

## 2016-02-23 DIAGNOSIS — C61 Malignant neoplasm of prostate: Secondary | ICD-10-CM | POA: Diagnosis not present

## 2016-02-23 MED ORDER — TECHNETIUM TC 99M MEDRONATE IV KIT
25.0000 | PACK | Freq: Once | INTRAVENOUS | Status: AC | PRN
  Administered 2016-02-23: 23.1 via INTRAVENOUS

## 2016-02-24 ENCOUNTER — Inpatient Hospital Stay: Payer: Medicare Other

## 2016-02-24 ENCOUNTER — Inpatient Hospital Stay: Payer: Medicare Other | Attending: Hematology and Oncology | Admitting: Hematology and Oncology

## 2016-02-24 ENCOUNTER — Encounter: Payer: Self-pay | Admitting: Hematology and Oncology

## 2016-02-24 VITALS — BP 105/67 | HR 89 | Temp 97.6°F | Ht 71.0 in | Wt 150.8 lb

## 2016-02-24 DIAGNOSIS — E785 Hyperlipidemia, unspecified: Secondary | ICD-10-CM | POA: Diagnosis not present

## 2016-02-24 DIAGNOSIS — R9721 Rising PSA following treatment for malignant neoplasm of prostate: Secondary | ICD-10-CM | POA: Insufficient documentation

## 2016-02-24 DIAGNOSIS — I10 Essential (primary) hypertension: Secondary | ICD-10-CM | POA: Insufficient documentation

## 2016-02-24 DIAGNOSIS — D649 Anemia, unspecified: Secondary | ICD-10-CM | POA: Diagnosis not present

## 2016-02-24 DIAGNOSIS — Z87891 Personal history of nicotine dependence: Secondary | ICD-10-CM | POA: Insufficient documentation

## 2016-02-24 DIAGNOSIS — Z79818 Long term (current) use of other agents affecting estrogen receptors and estrogen levels: Secondary | ICD-10-CM | POA: Diagnosis not present

## 2016-02-24 DIAGNOSIS — R634 Abnormal weight loss: Secondary | ICD-10-CM | POA: Diagnosis not present

## 2016-02-24 DIAGNOSIS — R918 Other nonspecific abnormal finding of lung field: Secondary | ICD-10-CM | POA: Diagnosis not present

## 2016-02-24 DIAGNOSIS — M1990 Unspecified osteoarthritis, unspecified site: Secondary | ICD-10-CM | POA: Diagnosis not present

## 2016-02-24 DIAGNOSIS — E538 Deficiency of other specified B group vitamins: Secondary | ICD-10-CM | POA: Diagnosis not present

## 2016-02-24 DIAGNOSIS — C61 Malignant neoplasm of prostate: Secondary | ICD-10-CM | POA: Diagnosis not present

## 2016-02-24 DIAGNOSIS — K219 Gastro-esophageal reflux disease without esophagitis: Secondary | ICD-10-CM | POA: Insufficient documentation

## 2016-02-24 DIAGNOSIS — R61 Generalized hyperhidrosis: Secondary | ICD-10-CM

## 2016-02-24 DIAGNOSIS — E079 Disorder of thyroid, unspecified: Secondary | ICD-10-CM | POA: Insufficient documentation

## 2016-02-24 DIAGNOSIS — I2510 Atherosclerotic heart disease of native coronary artery without angina pectoris: Secondary | ICD-10-CM | POA: Diagnosis not present

## 2016-02-24 NOTE — Progress Notes (Signed)
Patient here for follow up no concerns today. 

## 2016-02-24 NOTE — Progress Notes (Addendum)
[facility] day:  02/24/2016  Chief Complaint: Charles Flores is an 82 y.o. male with prostate cancer and B12 deficiency who is seen for review of interval bone scan and discussion regarding direction of therapy.  HPI: The patient was last seen in the medical oncology clinic on 02/15/2016.  At that time, he noted rare mild night sweats.  He denied bone pain. He had lost 10 pounds in the past 6 months.  PSA had drifted up to 14.37 (10.35 three months prior).  TSH and free T4 were normal.  Decision was made to pursue follow-up imaging.  Bone scan on 02/23/2016 was negative.  Symptomatically, he denies any complaints.  He states that it has been awhile since he had a colonoscopy.  He denies any melena or hematochezia.  He states that his diet is good.  He likes to eat green beans and corn.    Past Medical History  Diagnosis Date  . Prostate cancer ([place])   . Arthritis   . Cataract   . GERD (gastroesophageal reflux disease)   . Anemia 12/24/2014  . Hyperlipidemia   . Hyperlipidemia   . Coronary artery disease   . B12 deficiency anemia 01/01/2015    Past Surgical History  Procedure Laterality Date  . Prostate biopsy      Family History  Problem Relation Age of Onset  . Cancer Mother   . Breast cancer Mother   . Kidney disease Father   . Diabetes Sister   . Stroke Sister   . Gastric cancer Sister     Social History:  reports that he has quit smoking. His smoking use included Cigarettes. He has a 7.5 pack-year smoking history. He has never used smokeless tobacco. He reports that he does not drink alcohol or use illicit drugs.  He is alone today.  Allergies: No Known Allergies  Current Outpatient Prescriptions  Medication Sig Dispense Refill  . bicalutamide (CASODEX) 50 MG tablet TAKE 1 TABLET (50 MG TOTAL) BY MOUTH DAILY. 90 tablet 0   No current facility-administered medications for this visit.   ROS  GENERAL:  Feels good.  No  fevers.  Mild night sweats (rare).  Weight down 4 pounds since last visit.   PERFORMANCE STATUS (ECOG):  1 HEENT:  No visual changes, runny nose, sore throat, mouth sores or tenderness. Lungs: No shortness of breath or cough.  No hemoptysis. Cardiac:  No chest pain, palpitations, orthopnea, or PND. GI:  No nausea, vomiting, diarrhea, constipation, melena or hematochezia. GU:  No urgency, frequency, dysuria, or hematuria. Musculoskeletal:  No back pain.  No joint pain.  No muscle tenderness. Extremities:  No pain or swelling. Skin:  No rashes or skin changes. Neuro:  No headache, numbness or weakness.  Sometimes balance or coordination issues. Endocrine:  Rare night sweats.  No diabetes, thyroid issues, or hot flashes. Psych:  No mood changes, depression or anxiety. Pain:  No focal pain. Review of systems:  All other systems reviewed and found to be negative.  Physical Exam Blood pressure 105/67, pulse 89, temperature 97.6 F (36.4 C), temperature source Tympanic, height '5\' 11"'  (1.803 m), weight 150 lb 12.7 oz (68.4 kg).  GENERAL:  Well developed, well nourished gentleman sitting comfortably in the exam room in no acute distress. MENTAL STATUS:  Alert and oriented to person, place and time. HEAD:  Alopecia.  Slight gray mustache.  Normocephalic, atraumatic, face symmetric, no Cushingoid features. EYES:  Brown eyes (prominent) with slight  lid lag. No conjunctivitis or scleral icterus. NEUROLOGICAL: Unremarkable. PSYCH:  Appropriate.   No visits with results within 3 Day(s) from this visit. Latest known visit with results is:  Appointment on 02/15/2016  Component Date Value Ref Range Status  . WBC 02/15/2016 3.9  3.8 - 10.6 K/uL Final  . RBC 02/15/2016 3.52* 4.40 - 5.90 MIL/uL Final  . Hemoglobin 02/15/2016 11.7* 13.0 - 18.0 g/dL Final  . HCT 02/15/2016 34.1* 40.0 - 52.0 % Final  . MCV 02/15/2016 96.8  80.0 - 100.0 fL Final  . MCH 02/15/2016 33.1  26.0 - 34.0 pg Final  . MCHC  02/15/2016 34.2  32.0 - 36.0 g/dL Final  . RDW 02/15/2016 13.7  11.5 - 14.5 % Final  . Platelets 02/15/2016 211  150 - 440 K/uL Final  . Neutrophils Relative % 02/15/2016 57   Final  . Neutro Abs 02/15/2016 2.2  1.4 - 6.5 K/uL Final  . Lymphocytes Relative 02/15/2016 26   Final  . Lymphs Abs 02/15/2016 1.0  1.0 - 3.6 K/uL Final  . Monocytes Relative 02/15/2016 10   Final  . Monocytes Absolute 02/15/2016 0.4  0.2 - 1.0 K/uL Final  . Eosinophils Relative 02/15/2016 6   Final  . Eosinophils Absolute 02/15/2016 0.2  0 - 0.7 K/uL Final  . Basophils Relative 02/15/2016 1   Final  . Basophils Absolute 02/15/2016 0.0  0 - 0.1 K/uL Final  . Sodium 02/15/2016 137  135 - 145 mmol/L Final  . Potassium 02/15/2016 4.2  3.5 - 5.1 mmol/L Final  . Chloride 02/15/2016 104  101 - 111 mmol/L Final  . CO2 02/15/2016 26  22 - 32 mmol/L Final  . Glucose, Bld 02/15/2016 102* 65 - 99 mg/dL Final  . BUN 02/15/2016 24* 6 - 20 mg/dL Final  . Creatinine, Ser 02/15/2016 1.37* 0.61 - 1.24 mg/dL Final  . Calcium 02/15/2016 9.2  8.9 - 10.3 mg/dL Final  . Total Protein 02/15/2016 7.6  6.5 - 8.1 g/dL Final  . Albumin 02/15/2016 4.0  3.5 - 5.0 g/dL Final  . AST 02/15/2016 15  15 - 41 U/L Final  . ALT 02/15/2016 8* 17 - 63 U/L Final  . Alkaline Phosphatase 02/15/2016 68  38 - 126 U/L Final  . Total Bilirubin 02/15/2016 0.7  0.3 - 1.2 mg/dL Final  . GFR calc non Af Amer 02/15/2016 45* >60 mL/min Final  . GFR calc Af Amer 02/15/2016 52* >60 mL/min Final   Comment: (NOTE) The eGFR has been calculated using the CKD EPI equation. This calculation has not been validated in all clinical situations. eGFR's persistently <60 mL/min signify possible Chronic Kidney Disease.   . Anion gap 02/15/2016 7  5 - 15 Final  . PSA 02/15/2016 14.37* 0.00 - 4.00 ng/mL Final   Comment: (NOTE) While PSA levels of <=4.0 ng/ml are reported as reference range, some men with levels below 4.0 ng/ml can have prostate cancer and many men with  PSA above 4.0 ng/ml do not have prostate cancer.  Other tests such as free PSA, age specific reference ranges, PSA velocity and PSA doubling time may be helpful especially in men less than 78 years old. Performed at [facility]   . TSH 02/15/2016 1.807  0.350 - 4.500 uIU/mL Final  . Free T4 02/15/2016 0.81  0.61 - 1.12 ng/dL Final   Comment: (NOTE) Biotin ingestion may interfere with free T4 tests. If the results are inconsistent with the TSH level, previous test results, or the  clinical presentation, then consider biotin interference. If needed, order repeat testing after stopping biotin.     Assessment:  The patient is a 82 year old African American gentleman with prostate cancer and a rising PSA on Lupron.  Prostate cancer was discovered approximately 20 years secondary to an elevated PSA.  He underwent biopsy at [facility] (no report available).   Initial PSA was 774.  PSA was 127.6 on 06/30/2014 and 168.3 on 12/02/2014.  Abdomen and pelvic CT on 01/05/2006 revealed an enlarged and heterogeneous prostate with mass effect on the base of the bladder.  There was no adenopathy.  Bone scan on 04/08/2014, 12/29/2014, and 02/23/2016 revealed no evidence of metastatic disease.    He is on Lupron and Casodex.  He receives Lupron 45 mg every 6 months (last 12/27/2015).  PSA was 63.32 on 02/01/2015, 40.31 on 03/03/2015, 29.12 on 04/08/2015, 11.28 on 07/14/2015, 11.02 on 08/17/2015, 10.35 on 11/15/2015, and 14.37 on 02/15/2016.  He has a normocytic anemia. Work-up on 12/24/2014 revealed a B12 deficiency. B12 was 169. Folate was 11.  He began B12 supplimentation on 02/01/2015 (last 02/15/2016).  Normal studies included a CMP, ferritin, iron studies, folate, and TSH. Diet is good. He eats meat 2 times a week. He denies any melena, hematochezia, or hematuria. Last colonoscopy was more than 10 years ago.  Symptomatically, he denies any complaint.  He has lost 15  pounds since 06/04/2015.  Plan:   1.  Review bone scan.  No evidence of metastatic disease.  Discuss plan for follow-up CT scans. 2.  Continue Casodex. 3.  Schedule chest, abdomen, and pelvic CT 4.  Guaiac cards x 3. 5.  RTC after above.   Lequita Asal, MD  02/24/2016, 3:12 PM

## 2016-03-01 ENCOUNTER — Other Ambulatory Visit: Payer: Self-pay | Admitting: *Deleted

## 2016-03-01 DIAGNOSIS — Z23 Encounter for immunization: Secondary | ICD-10-CM | POA: Diagnosis not present

## 2016-03-01 DIAGNOSIS — Z0001 Encounter for general adult medical examination with abnormal findings: Secondary | ICD-10-CM | POA: Diagnosis not present

## 2016-03-01 DIAGNOSIS — D519 Vitamin B12 deficiency anemia, unspecified: Secondary | ICD-10-CM | POA: Diagnosis not present

## 2016-03-01 DIAGNOSIS — C61 Malignant neoplasm of prostate: Secondary | ICD-10-CM | POA: Diagnosis not present

## 2016-03-01 DIAGNOSIS — J439 Emphysema, unspecified: Secondary | ICD-10-CM | POA: Diagnosis not present

## 2016-03-01 DIAGNOSIS — R634 Abnormal weight loss: Secondary | ICD-10-CM | POA: Diagnosis not present

## 2016-03-02 DIAGNOSIS — Z79818 Long term (current) use of other agents affecting estrogen receptors and estrogen levels: Secondary | ICD-10-CM | POA: Diagnosis not present

## 2016-03-02 DIAGNOSIS — I10 Essential (primary) hypertension: Secondary | ICD-10-CM | POA: Diagnosis not present

## 2016-03-02 DIAGNOSIS — R9721 Rising PSA following treatment for malignant neoplasm of prostate: Secondary | ICD-10-CM | POA: Diagnosis not present

## 2016-03-02 DIAGNOSIS — K219 Gastro-esophageal reflux disease without esophagitis: Secondary | ICD-10-CM | POA: Diagnosis not present

## 2016-03-02 DIAGNOSIS — R61 Generalized hyperhidrosis: Secondary | ICD-10-CM | POA: Diagnosis not present

## 2016-03-02 DIAGNOSIS — Z87891 Personal history of nicotine dependence: Secondary | ICD-10-CM | POA: Diagnosis not present

## 2016-03-02 DIAGNOSIS — M1990 Unspecified osteoarthritis, unspecified site: Secondary | ICD-10-CM | POA: Diagnosis not present

## 2016-03-02 DIAGNOSIS — E785 Hyperlipidemia, unspecified: Secondary | ICD-10-CM | POA: Diagnosis not present

## 2016-03-02 DIAGNOSIS — R918 Other nonspecific abnormal finding of lung field: Secondary | ICD-10-CM | POA: Diagnosis not present

## 2016-03-02 DIAGNOSIS — E538 Deficiency of other specified B group vitamins: Secondary | ICD-10-CM | POA: Diagnosis not present

## 2016-03-02 DIAGNOSIS — E079 Disorder of thyroid, unspecified: Secondary | ICD-10-CM | POA: Diagnosis not present

## 2016-03-02 DIAGNOSIS — C61 Malignant neoplasm of prostate: Secondary | ICD-10-CM | POA: Diagnosis not present

## 2016-03-02 DIAGNOSIS — I2510 Atherosclerotic heart disease of native coronary artery without angina pectoris: Secondary | ICD-10-CM | POA: Diagnosis not present

## 2016-03-02 DIAGNOSIS — D649 Anemia, unspecified: Secondary | ICD-10-CM | POA: Diagnosis not present

## 2016-03-04 DIAGNOSIS — K219 Gastro-esophageal reflux disease without esophagitis: Secondary | ICD-10-CM | POA: Diagnosis not present

## 2016-03-04 DIAGNOSIS — I2510 Atherosclerotic heart disease of native coronary artery without angina pectoris: Secondary | ICD-10-CM | POA: Diagnosis not present

## 2016-03-04 DIAGNOSIS — D649 Anemia, unspecified: Secondary | ICD-10-CM | POA: Diagnosis not present

## 2016-03-04 DIAGNOSIS — Z87891 Personal history of nicotine dependence: Secondary | ICD-10-CM | POA: Diagnosis not present

## 2016-03-04 DIAGNOSIS — E079 Disorder of thyroid, unspecified: Secondary | ICD-10-CM | POA: Diagnosis not present

## 2016-03-04 DIAGNOSIS — R61 Generalized hyperhidrosis: Secondary | ICD-10-CM | POA: Diagnosis not present

## 2016-03-04 DIAGNOSIS — E785 Hyperlipidemia, unspecified: Secondary | ICD-10-CM | POA: Diagnosis not present

## 2016-03-04 DIAGNOSIS — Z79818 Long term (current) use of other agents affecting estrogen receptors and estrogen levels: Secondary | ICD-10-CM | POA: Diagnosis not present

## 2016-03-04 DIAGNOSIS — C61 Malignant neoplasm of prostate: Secondary | ICD-10-CM | POA: Diagnosis not present

## 2016-03-04 DIAGNOSIS — R918 Other nonspecific abnormal finding of lung field: Secondary | ICD-10-CM | POA: Diagnosis not present

## 2016-03-04 DIAGNOSIS — E538 Deficiency of other specified B group vitamins: Secondary | ICD-10-CM | POA: Diagnosis not present

## 2016-03-04 DIAGNOSIS — M1990 Unspecified osteoarthritis, unspecified site: Secondary | ICD-10-CM | POA: Diagnosis not present

## 2016-03-04 DIAGNOSIS — R9721 Rising PSA following treatment for malignant neoplasm of prostate: Secondary | ICD-10-CM | POA: Diagnosis not present

## 2016-03-04 DIAGNOSIS — I10 Essential (primary) hypertension: Secondary | ICD-10-CM | POA: Diagnosis not present

## 2016-03-05 DIAGNOSIS — I10 Essential (primary) hypertension: Secondary | ICD-10-CM | POA: Diagnosis not present

## 2016-03-05 DIAGNOSIS — R9721 Rising PSA following treatment for malignant neoplasm of prostate: Secondary | ICD-10-CM | POA: Diagnosis not present

## 2016-03-05 DIAGNOSIS — K219 Gastro-esophageal reflux disease without esophagitis: Secondary | ICD-10-CM | POA: Diagnosis not present

## 2016-03-05 DIAGNOSIS — I2510 Atherosclerotic heart disease of native coronary artery without angina pectoris: Secondary | ICD-10-CM | POA: Diagnosis not present

## 2016-03-05 DIAGNOSIS — C61 Malignant neoplasm of prostate: Secondary | ICD-10-CM | POA: Diagnosis not present

## 2016-03-05 DIAGNOSIS — Z87891 Personal history of nicotine dependence: Secondary | ICD-10-CM | POA: Diagnosis not present

## 2016-03-05 DIAGNOSIS — E079 Disorder of thyroid, unspecified: Secondary | ICD-10-CM | POA: Diagnosis not present

## 2016-03-05 DIAGNOSIS — R61 Generalized hyperhidrosis: Secondary | ICD-10-CM | POA: Diagnosis not present

## 2016-03-05 DIAGNOSIS — E785 Hyperlipidemia, unspecified: Secondary | ICD-10-CM | POA: Diagnosis not present

## 2016-03-05 DIAGNOSIS — Z79818 Long term (current) use of other agents affecting estrogen receptors and estrogen levels: Secondary | ICD-10-CM | POA: Diagnosis not present

## 2016-03-05 DIAGNOSIS — D649 Anemia, unspecified: Secondary | ICD-10-CM | POA: Diagnosis not present

## 2016-03-05 DIAGNOSIS — M1990 Unspecified osteoarthritis, unspecified site: Secondary | ICD-10-CM | POA: Diagnosis not present

## 2016-03-05 DIAGNOSIS — E538 Deficiency of other specified B group vitamins: Secondary | ICD-10-CM | POA: Diagnosis not present

## 2016-03-05 DIAGNOSIS — R918 Other nonspecific abnormal finding of lung field: Secondary | ICD-10-CM | POA: Diagnosis not present

## 2016-03-07 ENCOUNTER — Other Ambulatory Visit: Payer: Self-pay | Admitting: *Deleted

## 2016-03-07 DIAGNOSIS — D649 Anemia, unspecified: Secondary | ICD-10-CM

## 2016-03-07 LAB — OCCULT BLOOD X 1 CARD TO LAB, STOOL
Fecal Occult Bld: NEGATIVE
Fecal Occult Bld: NEGATIVE
Fecal Occult Bld: NEGATIVE

## 2016-03-14 ENCOUNTER — Inpatient Hospital Stay: Payer: Medicare Other

## 2016-03-14 VITALS — BP 95/60 | HR 88 | Resp 20

## 2016-03-14 DIAGNOSIS — D649 Anemia, unspecified: Secondary | ICD-10-CM | POA: Diagnosis not present

## 2016-03-14 DIAGNOSIS — E079 Disorder of thyroid, unspecified: Secondary | ICD-10-CM | POA: Diagnosis not present

## 2016-03-14 DIAGNOSIS — R9721 Rising PSA following treatment for malignant neoplasm of prostate: Secondary | ICD-10-CM | POA: Diagnosis not present

## 2016-03-14 DIAGNOSIS — R918 Other nonspecific abnormal finding of lung field: Secondary | ICD-10-CM | POA: Diagnosis not present

## 2016-03-14 DIAGNOSIS — R61 Generalized hyperhidrosis: Secondary | ICD-10-CM | POA: Diagnosis not present

## 2016-03-14 DIAGNOSIS — D519 Vitamin B12 deficiency anemia, unspecified: Secondary | ICD-10-CM

## 2016-03-14 DIAGNOSIS — E538 Deficiency of other specified B group vitamins: Secondary | ICD-10-CM | POA: Diagnosis not present

## 2016-03-14 DIAGNOSIS — C61 Malignant neoplasm of prostate: Secondary | ICD-10-CM | POA: Diagnosis not present

## 2016-03-14 DIAGNOSIS — M1990 Unspecified osteoarthritis, unspecified site: Secondary | ICD-10-CM | POA: Diagnosis not present

## 2016-03-14 DIAGNOSIS — Z79818 Long term (current) use of other agents affecting estrogen receptors and estrogen levels: Secondary | ICD-10-CM | POA: Diagnosis not present

## 2016-03-14 DIAGNOSIS — I2510 Atherosclerotic heart disease of native coronary artery without angina pectoris: Secondary | ICD-10-CM | POA: Diagnosis not present

## 2016-03-14 DIAGNOSIS — E041 Nontoxic single thyroid nodule: Secondary | ICD-10-CM

## 2016-03-14 DIAGNOSIS — Z87891 Personal history of nicotine dependence: Secondary | ICD-10-CM | POA: Diagnosis not present

## 2016-03-14 DIAGNOSIS — I10 Essential (primary) hypertension: Secondary | ICD-10-CM | POA: Diagnosis not present

## 2016-03-14 DIAGNOSIS — E785 Hyperlipidemia, unspecified: Secondary | ICD-10-CM | POA: Diagnosis not present

## 2016-03-14 DIAGNOSIS — K219 Gastro-esophageal reflux disease without esophagitis: Secondary | ICD-10-CM | POA: Diagnosis not present

## 2016-03-14 HISTORY — DX: Nontoxic single thyroid nodule: E04.1

## 2016-03-14 MED ORDER — CYANOCOBALAMIN 1000 MCG/ML IJ SOLN
1000.0000 ug | Freq: Once | INTRAMUSCULAR | Status: AC
Start: 2016-03-14 — End: 2016-03-14
  Administered 2016-03-14: 1000 ug via INTRAMUSCULAR
  Filled 2016-03-14: qty 1

## 2016-03-15 ENCOUNTER — Ambulatory Visit
Admission: RE | Admit: 2016-03-15 | Discharge: 2016-03-15 | Disposition: A | Discharge status: Home / Self Care | Payer: Medicare Other | Source: Ambulatory Visit | Attending: Hematology and Oncology | Admitting: Hematology and Oncology

## 2016-03-15 DIAGNOSIS — E041 Nontoxic single thyroid nodule: Secondary | ICD-10-CM | POA: Diagnosis not present

## 2016-03-15 DIAGNOSIS — J432 Centrilobular emphysema: Secondary | ICD-10-CM | POA: Diagnosis not present

## 2016-03-15 DIAGNOSIS — R918 Other nonspecific abnormal finding of lung field: Secondary | ICD-10-CM | POA: Diagnosis not present

## 2016-03-15 DIAGNOSIS — C61 Malignant neoplasm of prostate: Secondary | ICD-10-CM | POA: Diagnosis not present

## 2016-03-15 DIAGNOSIS — R634 Abnormal weight loss: Secondary | ICD-10-CM | POA: Insufficient documentation

## 2016-03-17 ENCOUNTER — Encounter: Payer: Self-pay | Admitting: Hematology and Oncology

## 2016-03-17 ENCOUNTER — Other Ambulatory Visit: Payer: Self-pay | Admitting: Hematology and Oncology

## 2016-03-17 ENCOUNTER — Inpatient Hospital Stay (HOSPITAL_BASED_OUTPATIENT_CLINIC_OR_DEPARTMENT_OTHER): Payer: Medicare Other | Admitting: Hematology and Oncology

## 2016-03-17 ENCOUNTER — Inpatient Hospital Stay: Payer: Medicare Other

## 2016-03-17 VITALS — BP 117/76 | HR 81 | Temp 97.5°F | Resp 18 | Wt 150.5 lb

## 2016-03-17 DIAGNOSIS — D649 Anemia, unspecified: Secondary | ICD-10-CM | POA: Diagnosis not present

## 2016-03-17 DIAGNOSIS — Z87891 Personal history of nicotine dependence: Secondary | ICD-10-CM

## 2016-03-17 DIAGNOSIS — C61 Malignant neoplasm of prostate: Secondary | ICD-10-CM | POA: Diagnosis not present

## 2016-03-17 DIAGNOSIS — E785 Hyperlipidemia, unspecified: Secondary | ICD-10-CM | POA: Diagnosis not present

## 2016-03-17 DIAGNOSIS — R634 Abnormal weight loss: Secondary | ICD-10-CM

## 2016-03-17 DIAGNOSIS — E041 Nontoxic single thyroid nodule: Secondary | ICD-10-CM

## 2016-03-17 DIAGNOSIS — E538 Deficiency of other specified B group vitamins: Secondary | ICD-10-CM

## 2016-03-17 DIAGNOSIS — R61 Generalized hyperhidrosis: Secondary | ICD-10-CM

## 2016-03-17 DIAGNOSIS — I10 Essential (primary) hypertension: Secondary | ICD-10-CM

## 2016-03-17 DIAGNOSIS — E079 Disorder of thyroid, unspecified: Secondary | ICD-10-CM

## 2016-03-17 DIAGNOSIS — R9721 Rising PSA following treatment for malignant neoplasm of prostate: Secondary | ICD-10-CM | POA: Diagnosis not present

## 2016-03-17 DIAGNOSIS — M1990 Unspecified osteoarthritis, unspecified site: Secondary | ICD-10-CM

## 2016-03-17 DIAGNOSIS — I2510 Atherosclerotic heart disease of native coronary artery without angina pectoris: Secondary | ICD-10-CM | POA: Diagnosis not present

## 2016-03-17 DIAGNOSIS — R918 Other nonspecific abnormal finding of lung field: Secondary | ICD-10-CM | POA: Diagnosis not present

## 2016-03-17 DIAGNOSIS — Z79818 Long term (current) use of other agents affecting estrogen receptors and estrogen levels: Secondary | ICD-10-CM

## 2016-03-17 DIAGNOSIS — K219 Gastro-esophageal reflux disease without esophagitis: Secondary | ICD-10-CM | POA: Diagnosis not present

## 2016-03-17 DIAGNOSIS — D519 Vitamin B12 deficiency anemia, unspecified: Secondary | ICD-10-CM

## 2016-03-17 NOTE — Progress Notes (Signed)
[facility] day:  03/17/16  Chief Complaint: Charles Flores is an 81 y.o. male with prostate cancer and B12 deficiency who is seen for review of CT scans and discussion regarding direction of therapy.  HPI: The patient was last seen in the medical oncology clinic on 02/24/2016.  At that time, he denied any complaints.  Concern was raised regarding weight loss.  He had mild anemia.  PSA had drifted up slightly to 14.37. Bone scan on 02/23/2016 was negative.  We discussed follow-up CT scans.  Guaiac cards x 3 were negative.  Chest, abdomen, and pelvic CT scan on 03/15/2016 revealed moderate centrilobular emphysema.  There were mild lucent mottled appearance of the osseous structures (cannot exclude multiple myeloma).  There were scattered pulmonary nodules (4 mm or less in size).  There was a 2.7 cm low-attenuation left thyroid nodule.   He received B12 on 03/14/2016.  Symptomatically, he denies any complaints.  Weight is stable.  He denies any melena or hematochezia.   Past Medical History:  Diagnosis Date  . Anemia 12/24/2014  . Arthritis   . B12 deficiency anemia 01/01/2015  . Cataract   . Coronary artery disease   . GERD (gastroesophageal reflux disease)   . Hyperlipidemia   . Hyperlipidemia   . Prostate cancer ([place])   . Thyroid nodule 03/14/2016    Past Surgical History:  Procedure Laterality Date  . PROSTATE BIOPSY      Family History  Problem Relation Age of Onset  . Cancer Mother   . Breast cancer Mother   . Kidney disease Father   . Diabetes Sister   . Stroke Sister   . Gastric cancer Sister     Social History:  reports that he has quit smoking. His smoking use included Cigarettes. He has a 7.50 pack-year smoking history. He has never used smokeless tobacco. He reports that he does not drink alcohol or use drugs.  He is alone today.  Allergies: No Known Allergies  Current Outpatient Prescriptions  Medication Sig Dispense  Refill  . bicalutamide (CASODEX) 50 MG tablet TAKE 1 TABLET (50 MG TOTAL) BY MOUTH DAILY. 90 tablet 0   No current facility-administered medications for this visit.    ROS  GENERAL:  Feels good.  No fevers.  Mild night sweats (rare).  Weight stable since last visit.   PERFORMANCE STATUS (ECOG):  1 HEENT:  No visual changes, runny nose, sore throat, mouth sores or tenderness. Lungs: No shortness of breath or cough.  No hemoptysis. Cardiac:  No chest pain, palpitations, orthopnea, or PND. GI:  No nausea, vomiting, diarrhea, constipation, melena or hematochezia. GU:  No urgency, frequency, dysuria, or hematuria. Musculoskeletal:  No back pain.  No joint pain.  No muscle tenderness. Extremities:  No pain or swelling. Skin:  No rashes or skin changes. Neuro:  No headache, numbness or weakness.  Sometimes balance or coordination issues. Endocrine:  Rare night sweats (no change).  No diabetes, thyroid issues, or hot flashes. Psych:  No mood changes, depression or anxiety. Pain:  No focal pain. Review of systems:  All other systems reviewed and found to be negative.  Physical Exam Blood pressure 117/76, pulse 81, temperature 97.5 F (36.4 C), temperature source Tympanic, resp. rate 18, weight 150 lb 7.4 oz (68.2 kg).  GENERAL:  Well developed, well nourished gentleman sitting comfortably in the exam room in no acute distress. MENTAL STATUS:  Alert and oriented to person, place and time. HEAD:  Wearing a straw hat.  Alopecia.  Slight gray mustache.  Normocephalic, atraumatic, face symmetric, no Cushingoid features. EYES:  Brown eyes (prominent) with slight lid lag. No conjunctivitis or scleral icterus. NEUROLOGICAL: Unremarkable. PSYCH:  Appropriate.   No visits with results within 3 Day(s) from this visit.  Latest known visit with results is:  Orders Only on 03/07/2016  Component Date Value Ref Range Status  . Fecal Occult Bld 03/07/2016 NEGATIVE  NEGATIVE Final  . Fecal Occult Bld  03/07/2016 NEGATIVE  NEGATIVE Final  . Fecal Occult Bld 03/07/2016 NEGATIVE  NEGATIVE Final    Assessment:  The patient is a 82 year old African American gentleman with prostate cancer and a rising PSA on Lupron.  Prostate cancer was discovered approximately 20 years secondary to an elevated PSA.  He underwent biopsy at [facility] (no report available). Initial PSA was 774.    Abdomen and pelvic CT on 01/05/2006 revealed an enlarged and heterogeneous prostate with mass effect on the base of the bladder.  There was no adenopathy.  Chest, abdomen, and pelvic CT scan on 03/15/2016 revealed moderate centrilobular emphysema.  There were mild lucent mottled appearance of the osseous structures (cannot exclude multiple myeloma).  There were scattered pulmonary nodules (4 mm or less in size).  There was a 2.7 cm low-attenuation left thyroid nodule.   Bone scan on 04/08/2014, 12/29/2014, and 02/23/2016 revealed no evidence of metastatic disease.    He is on Lupron and Casodex.  He receives Lupron 45 mg every 6 months (last 12/27/2015).  PSA was 127.6 on 06/30/2014, 168.3 on 12/02/2014, 63.32 on 02/01/2015, 40.31 on 03/03/2015, 29.12 on 04/08/2015, 11.28 on 07/14/2015, 11.02 on 08/17/2015, 10.35 on 11/15/2015, and 14.37 on 02/15/2016.  He has a normocytic anemia. Work-up on 12/24/2014 revealed a B12 deficiency. B12 was 169. Folate was 11.  He began B12 supplimentation on 02/01/2015 (last 03/14/2016).  Normal studies included a CMP, ferritin, iron studies, folate, and TSH. Diet is good. He eats meat 2 times a week. He denies any melena, hematochezia, or hematuria. Last colonoscopy was more than 10 years ago.  Guaiac cards x 3 were negative.  Symptomatically, he denies any complaint.  He has lost 15 pounds since 06/04/2015 (stable x 3 weeks).  Plan:   1.  Review CT scans.  Discuss mottled appearance of bones and thyroid nodule.  Discuss additional testing with thyroid ultrasound and  labs to r/o myeloma. 2.  Labs today:  SPEP, free light chains, immunoglobulins. 3.  Collect 24 hour urine for UPEP and free light chains. 4.  Thyroid ultrasound. 5.  Continue monthly B12 (scheduled:  08/22, 09/19, 10/17). 6.  RTC in 2 weeks for review of interval studies.   Lequita Asal, MD  03/17/2016, 9:14 AM

## 2016-03-17 NOTE — Progress Notes (Signed)
Patient is here for follow up, he has no complaints today.

## 2016-03-18 LAB — IGG, IGA, IGM
IgA: 282 mg/dL (ref 61–437)
IgG (Immunoglobin G), Serum: 1391 mg/dL (ref 700–1600)
IgM, Serum: 29 mg/dL (ref 15–143)

## 2016-03-20 LAB — PROTEIN ELECTROPHORESIS, SERUM
A/G Ratio: 1.1 (ref 0.7–1.7)
Albumin ELP: 3.9 g/dL (ref 2.9–4.4)
Alpha-1-Globulin: 0.2 g/dL (ref 0.0–0.4)
Alpha-2-Globulin: 0.7 g/dL (ref 0.4–1.0)
Beta Globulin: 1.1 g/dL (ref 0.7–1.3)
Gamma Globulin: 1.4 g/dL (ref 0.4–1.8)
Globulin, Total: 3.4 g/dL (ref 2.2–3.9)
Total Protein ELP: 7.3 g/dL (ref 6.0–8.5)

## 2016-03-20 LAB — KAPPA/LAMBDA LIGHT CHAINS
Kappa free light chain: 28.9 mg/L — ABNORMAL HIGH (ref 3.3–19.4)
Kappa, lambda light chain ratio: 1.4 (ref 0.26–1.65)
Lambda free light chains: 20.7 mg/L (ref 5.7–26.3)

## 2016-03-22 ENCOUNTER — Other Ambulatory Visit: Payer: Self-pay | Admitting: Hematology and Oncology

## 2016-03-22 ENCOUNTER — Other Ambulatory Visit: Payer: Self-pay | Admitting: *Deleted

## 2016-03-22 ENCOUNTER — Ambulatory Visit
Admission: RE | Admit: 2016-03-22 | Discharge: 2016-03-22 | Disposition: A | Discharge status: Home / Self Care | Payer: Medicare Other | Source: Ambulatory Visit | Attending: Hematology and Oncology | Admitting: Hematology and Oncology

## 2016-03-22 DIAGNOSIS — D649 Anemia, unspecified: Secondary | ICD-10-CM

## 2016-03-22 DIAGNOSIS — E041 Nontoxic single thyroid nodule: Secondary | ICD-10-CM | POA: Diagnosis not present

## 2016-03-24 LAB — UPEP/TP, 24-HR URINE
Albumin, U: 100 %
Alpha 1, Urine: 0 %
Alpha 2, Urine: 0 %
Beta, Urine: 0 %
Gamma Globulin, Urine: 0 %
Total Protein, Urine-Ur/day: <76 mg/24 hr (ref 30–150)
Total Protein, Urine: <4 mg/dL
Total Volume: 1900

## 2016-03-27 ENCOUNTER — Telehealth: Payer: Self-pay | Admitting: *Deleted

## 2016-03-27 NOTE — Telephone Encounter (Signed)
Called pt's phone and no  Answer and no voicemail and called emergency contact and the cellular phone is not accepting phone calls right now and no voicemail.. Will try again tom. Mike Gip wants to get pt set up for thyrpoid bx since his u/s was positive for thyroid nodule.

## 2016-03-29 ENCOUNTER — Telehealth: Payer: Self-pay | Admitting: *Deleted

## 2016-03-29 NOTE — Telephone Encounter (Signed)
I called pt yest. And today and there is no answer to his phone and no voicemail., called the other contact on his list and that phone is not accepting phone calls. This is the 3rd day pt has not answered and the person list phone states not accepting calls.  Pt has appt on 8/11 and md will need to discuss findings of u/s of thyroid then since we are unable to communicate to him via telephone. I have tried several times each day on both numbers in the chart and this am was my third day trying to contact him or the alternative contact.

## 2016-03-30 NOTE — Progress Notes (Signed)
[facility] day:  03/31/16  Chief Complaint: Charles Flores is an 82 y.o. male with prostate cancer and B12 deficiency who is seen for review of interval studies and discussion regarding direction of therapy.  HPI: The patient was last seen in the medical oncology clinic on 03/17/2016.  At that time, CT scans on 03/15/2016 mild lucent mottled appearance of the osseous structures (cannot exclude multiple myeloma).  There were scattered pulmonary nodules (4 mm or less in size).  There was a 2.7 cm low-attenuation left thyroid nodule.   Additional labs were performed.  SPEP revealed no monoclonal protein.  Kappa free light chains were 28.9, lambda free light chains 20.7 with a ratio of 1.40 (normal).  Immunoglobulins were normal (IgG 1391, IgA 282, IgM 29).  24 hour urine revealed no monoclonal protein.  Thyroid ultrasound on 03/22/2016 revealed a solitary 2.9 cm left lower pole nodule.  Symptomatically, he  Notes occasional dizziness and shortness of breath on exertion.   Past Medical History:  Diagnosis Date  . Anemia 12/24/2014  . Arthritis   . B12 deficiency anemia 01/01/2015  . Cataract   . Coronary artery disease   . GERD (gastroesophageal reflux disease)   . Hyperlipidemia   . Hyperlipidemia   . Prostate cancer ([place])   . Thyroid nodule 03/14/2016    Past Surgical History:  Procedure Laterality Date  . PROSTATE BIOPSY      Family History  Problem Relation Age of Onset  . Cancer Mother   . Breast cancer Mother   . Kidney disease Father   . Diabetes Sister   . Stroke Sister   . Gastric cancer Sister     Social History:  reports that he has quit smoking. His smoking use included Cigarettes. He has a 7.50 pack-year smoking history. He has never used smokeless tobacco. He reports that he does not drink alcohol or use drugs.  He is alone today.  Allergies: No Known Allergies  Current Outpatient Prescriptions  Medication Sig Dispense  Refill  . bicalutamide (CASODEX) 50 MG tablet TAKE 1 TABLET (50 MG TOTAL) BY MOUTH DAILY. 90 tablet 0   No current facility-administered medications for this visit.    ROS  GENERAL:  Feels fine.  No fevers.  Rare night sweats.  Weight down 2 pounds.   PERFORMANCE STATUS (ECOG):  1 HEENT:  No visual changes, runny nose, sore throat, mouth sores or tenderness. Lungs:  Shortness of breath  With exertion, sometimes.  No cough.  No hemoptysis. Cardiac:  No chest pain, palpitations, orthopnea, or PND. GI:  No nausea, vomiting, diarrhea, constipation, melena or hematochezia. GU:  No urgency, frequency, dysuria, or hematuria. Musculoskeletal:  No back pain.  No joint pain.  No muscle tenderness. Extremities:  No pain or swelling. Skin:  No rashes or skin changes. Neuro:  No headache, numbness or weakness.  Sometimes balance or coordination issues. Endocrine:  Rare night sweats (no change).  No diabetes, thyroid issues, or hot flashes. Psych:  No mood changes, depression or anxiety. Pain:  No focal pain. Review of systems:  All other systems reviewed and found to be negative.  Physical Exam Blood pressure 99/65, pulse 89, temperature (!) 95.5 F (35.3 C), temperature source Tympanic, resp. rate 18, weight 148 lb 7.7 oz (67.4 kg).  GENERAL:  Well developed, well nourished gentleman sitting comfortably in the exam room in no acute distress. MENTAL STATUS:  Alert and oriented to person, place and time. HEAD:  Alopecia.  Slight gray mustache.  Normocephalic, atraumatic, face symmetric, no Cushingoid features. EYES:  Brown eyes (prominent) with slight lid lag. No conjunctivitis or scleral icterus. NECK:  Palpable left thyroid nodule. RESPIRATORY:  Clear to auscultation without rales, wheezes or rhonchi. CARDIOVASCULAR:  Regular rate and rhythm without murmur, rub or gallop. NEUROLOGICAL: Unremarkable. PSYCH:  Appropriate.   No visits with results within 3 Day(s) from this visit.  Latest known  visit with results is:  Orders Only on 03/22/2016  Component Date Value Ref Range Status  . Total Protein, Urine 03/24/2016 <4.0  Not Estab. mg/dL Final  . Total Protein, Urine-Ur/day 03/24/2016 <76  30 - 150 mg/24 hr Final  . Albumin, U 03/24/2016 100.0  % Final  . Alpha 1, Urine 03/24/2016 0.0  % Final  . Alpha 2, Urine 03/24/2016 0.0  % Final  . Beta, Urine 03/24/2016 0.0  % Final  . Gamma Globulin, Urine 03/24/2016 0.0  % Final  . M-spike, % 03/24/2016 Not Observed  Not Observed % Final  . PLEASE NOTE: 03/24/2016 Comment   Final   Comment: (NOTE) Protein electrophoresis scan will follow via computer, mail, or courier delivery. Performed At: [facility] [place], [place] [id number] Lindon Romp MD XB:[id number]   . Total Volume 03/24/2016 1900   Final    Assessment:  The patient is a 82 year old African American gentleman with prostate cancer and a rising PSA on Lupron.  Prostate cancer was discovered approximately 20 years secondary to an elevated PSA.  He underwent biopsy at [facility] (no report available). Initial PSA was 774.    Abdomen and pelvic CT on 01/05/2006 revealed an enlarged and heterogeneous prostate with mass effect on the base of the bladder.  There was no adenopathy.  Chest, abdomen, and pelvic CT scan on 03/15/2016 revealed moderate centrilobular emphysema.  There were mild lucent mottled appearance of the osseous structures (cannot exclude multiple myeloma).  There were scattered pulmonary nodules (4 mm or less in size).  There was a 2.7 cm low-attenuation left thyroid nodule.   Work-up on 03/17/2016 revealed no monoclonal protein.  SPEP revealed no monoclonal protein.  Kappa free light chains were 28.9, lambda free light chains 20.7 with a ratio of 1.40 (normal).  Immunoglobulins were normal (IgG 1391, IgA 282, IgM 29).  24 hour urine revealed no monoclonal protein.  Thyroid ultrasound on 03/22/2016 revealed a  solitary 2.9 cm left lower pole nodule.  Bone scan on 04/08/2014, 12/29/2014, and 02/23/2016 revealed no evidence of metastatic disease.    He is on Lupron and Casodex.  He receives Lupron 45 mg every 6 months (last 12/27/2015).  PSA was 127.6 on 06/30/2014, 168.3 on 12/02/2014, 63.32 on 02/01/2015, 40.31 on 03/03/2015, 29.12 on 04/08/2015, 11.28 on 07/14/2015, 11.02 on 08/17/2015, 10.35 on 11/15/2015, and 14.37 on 02/15/2016.  He has a normocytic anemia. He was diagnosed with B12 deficiency on 12/24/2014. B12 was 169 (low). Folate was 11.  He began B12 supplimentation on 02/01/2015 (last 03/14/2016).  Normal studies included a CMP, ferritin, iron studies, folate, and TSH. Diet is good. He eats meat 2 times a week. He denies any melena, hematochezia, or hematuria. Last colonoscopy was more than 10 years ago.  Guaiac cards x 3 were negative.  Symptomatically, he notes shortness of breath with exertion and occasional dizziness.  Plan:   1.  Discuss results of SPEP, free light chains, and 24 hour urine.  No evidence of monoclonal gammopathy/myeloma. 2.  Discuss thyroid ultrasound.  Discuss consideration of thyroid biopsy. 3.  Ultrasound guided thyroid biopsy next week 4.  RTC on 05/09/2016 for MD review of results + B12.   Lequita Asal, MD  03/31/2016, 9:18 AM

## 2016-03-31 ENCOUNTER — Ambulatory Visit: Payer: Self-pay | Admitting: Internal Medicine

## 2016-03-31 ENCOUNTER — Inpatient Hospital Stay: Payer: Medicare Other | Attending: Internal Medicine | Admitting: Hematology and Oncology

## 2016-03-31 ENCOUNTER — Encounter: Payer: Self-pay | Admitting: Hematology and Oncology

## 2016-03-31 VITALS — BP 99/65 | HR 89 | Temp 95.5°F | Resp 18 | Wt 148.5 lb

## 2016-03-31 DIAGNOSIS — R0602 Shortness of breath: Secondary | ICD-10-CM | POA: Diagnosis not present

## 2016-03-31 DIAGNOSIS — E785 Hyperlipidemia, unspecified: Secondary | ICD-10-CM

## 2016-03-31 DIAGNOSIS — K219 Gastro-esophageal reflux disease without esophagitis: Secondary | ICD-10-CM | POA: Diagnosis not present

## 2016-03-31 DIAGNOSIS — E538 Deficiency of other specified B group vitamins: Secondary | ICD-10-CM | POA: Diagnosis not present

## 2016-03-31 DIAGNOSIS — I10 Essential (primary) hypertension: Secondary | ICD-10-CM | POA: Diagnosis not present

## 2016-03-31 DIAGNOSIS — I2510 Atherosclerotic heart disease of native coronary artery without angina pectoris: Secondary | ICD-10-CM | POA: Diagnosis not present

## 2016-03-31 DIAGNOSIS — R918 Other nonspecific abnormal finding of lung field: Secondary | ICD-10-CM

## 2016-03-31 DIAGNOSIS — N400 Enlarged prostate without lower urinary tract symptoms: Secondary | ICD-10-CM | POA: Diagnosis not present

## 2016-03-31 DIAGNOSIS — E041 Nontoxic single thyroid nodule: Secondary | ICD-10-CM

## 2016-03-31 DIAGNOSIS — Z87891 Personal history of nicotine dependence: Secondary | ICD-10-CM

## 2016-03-31 DIAGNOSIS — Z79899 Other long term (current) drug therapy: Secondary | ICD-10-CM

## 2016-03-31 DIAGNOSIS — C61 Malignant neoplasm of prostate: Secondary | ICD-10-CM | POA: Diagnosis not present

## 2016-03-31 DIAGNOSIS — M1990 Unspecified osteoarthritis, unspecified site: Secondary | ICD-10-CM | POA: Diagnosis not present

## 2016-03-31 DIAGNOSIS — Z79818 Long term (current) use of other agents affecting estrogen receptors and estrogen levels: Secondary | ICD-10-CM | POA: Diagnosis not present

## 2016-03-31 DIAGNOSIS — R42 Dizziness and giddiness: Secondary | ICD-10-CM

## 2016-03-31 DIAGNOSIS — Z800 Family history of malignant neoplasm of digestive organs: Secondary | ICD-10-CM | POA: Diagnosis not present

## 2016-03-31 DIAGNOSIS — R61 Generalized hyperhidrosis: Secondary | ICD-10-CM

## 2016-03-31 DIAGNOSIS — Z803 Family history of malignant neoplasm of breast: Secondary | ICD-10-CM | POA: Insufficient documentation

## 2016-03-31 NOTE — Progress Notes (Signed)
States is feeling well. Offers no complaints. Has had 2lb weight loss since last visit. Has good appetite. Here to discuss results.

## 2016-03-31 NOTE — Progress Notes (Signed)
Faxed bx to specialty sch for thyroid bx request

## 2016-04-06 ENCOUNTER — Telehealth: Payer: Self-pay | Admitting: *Deleted

## 2016-04-06 NOTE — Telephone Encounter (Signed)
I called patient and spoke to him regarding his upcoming appointment for Biopsy of Left Thyroid. He is to arrive at the registration/Admitting Desk in the [place] [facility] at 1:30 PM on Weds August 24th , the procedure is scheduled for 2 PM. I told patient he May eat and drink as usual prior to procedure. I gave him his Follow up appt with Dr Mike Gip for Sept 7th at 3:30 PM at the [facility]. Patient wrote both appts down and voiced his understanding.

## 2016-04-11 ENCOUNTER — Inpatient Hospital Stay: Payer: Medicare Other

## 2016-04-11 DIAGNOSIS — I2510 Atherosclerotic heart disease of native coronary artery without angina pectoris: Secondary | ICD-10-CM | POA: Diagnosis not present

## 2016-04-11 DIAGNOSIS — M1990 Unspecified osteoarthritis, unspecified site: Secondary | ICD-10-CM | POA: Diagnosis not present

## 2016-04-11 DIAGNOSIS — Z79899 Other long term (current) drug therapy: Secondary | ICD-10-CM | POA: Diagnosis not present

## 2016-04-11 DIAGNOSIS — Z79818 Long term (current) use of other agents affecting estrogen receptors and estrogen levels: Secondary | ICD-10-CM | POA: Diagnosis not present

## 2016-04-11 DIAGNOSIS — Z803 Family history of malignant neoplasm of breast: Secondary | ICD-10-CM | POA: Diagnosis not present

## 2016-04-11 DIAGNOSIS — K219 Gastro-esophageal reflux disease without esophagitis: Secondary | ICD-10-CM | POA: Diagnosis not present

## 2016-04-11 DIAGNOSIS — E041 Nontoxic single thyroid nodule: Secondary | ICD-10-CM | POA: Diagnosis not present

## 2016-04-11 DIAGNOSIS — D519 Vitamin B12 deficiency anemia, unspecified: Secondary | ICD-10-CM

## 2016-04-11 DIAGNOSIS — R918 Other nonspecific abnormal finding of lung field: Secondary | ICD-10-CM | POA: Diagnosis not present

## 2016-04-11 DIAGNOSIS — I10 Essential (primary) hypertension: Secondary | ICD-10-CM | POA: Diagnosis not present

## 2016-04-11 DIAGNOSIS — E538 Deficiency of other specified B group vitamins: Secondary | ICD-10-CM | POA: Diagnosis not present

## 2016-04-11 DIAGNOSIS — N400 Enlarged prostate without lower urinary tract symptoms: Secondary | ICD-10-CM | POA: Diagnosis not present

## 2016-04-11 DIAGNOSIS — Z800 Family history of malignant neoplasm of digestive organs: Secondary | ICD-10-CM | POA: Diagnosis not present

## 2016-04-11 DIAGNOSIS — C61 Malignant neoplasm of prostate: Secondary | ICD-10-CM | POA: Diagnosis not present

## 2016-04-11 DIAGNOSIS — R61 Generalized hyperhidrosis: Secondary | ICD-10-CM | POA: Diagnosis not present

## 2016-04-11 DIAGNOSIS — R42 Dizziness and giddiness: Secondary | ICD-10-CM | POA: Diagnosis not present

## 2016-04-11 DIAGNOSIS — R0602 Shortness of breath: Secondary | ICD-10-CM | POA: Diagnosis not present

## 2016-04-11 DIAGNOSIS — Z87891 Personal history of nicotine dependence: Secondary | ICD-10-CM | POA: Diagnosis not present

## 2016-04-11 DIAGNOSIS — E785 Hyperlipidemia, unspecified: Secondary | ICD-10-CM | POA: Diagnosis not present

## 2016-04-11 MED ORDER — CYANOCOBALAMIN 1000 MCG/ML IJ SOLN
1000.0000 ug | Freq: Once | INTRAMUSCULAR | Status: AC
  Administered 2016-04-11: 1000 ug via INTRAMUSCULAR
  Filled 2016-04-11: qty 1

## 2016-04-13 ENCOUNTER — Ambulatory Visit: Admission: RE | Admit: 2016-04-13 | Payer: Medicare Other | Source: Ambulatory Visit

## 2016-04-14 ENCOUNTER — Ambulatory Visit
Admission: RE | Admit: 2016-04-14 | Discharge: 2016-04-14 | Disposition: A | Discharge status: Home / Self Care | Payer: Medicare Other | Source: Ambulatory Visit | Attending: Hematology and Oncology | Admitting: Hematology and Oncology

## 2016-04-14 DIAGNOSIS — E041 Nontoxic single thyroid nodule: Secondary | ICD-10-CM | POA: Insufficient documentation

## 2016-04-14 NOTE — Procedures (Signed)
S/p LT TYROID COMPLEX CYST ASPIRATION  NO COMP STABLE  CYST FLUID SENT FOR CYTO  FULL REPORT IN PACS

## 2016-04-17 ENCOUNTER — Other Ambulatory Visit: Payer: Self-pay | Admitting: Hematology and Oncology

## 2016-04-17 DIAGNOSIS — E041 Nontoxic single thyroid nodule: Secondary | ICD-10-CM

## 2016-04-17 LAB — CYTOLOGY - NON PAP

## 2016-04-27 ENCOUNTER — Other Ambulatory Visit: Payer: Self-pay | Admitting: Hematology and Oncology

## 2016-04-27 ENCOUNTER — Inpatient Hospital Stay: Payer: Medicare Other | Attending: Hematology and Oncology | Admitting: Hematology and Oncology

## 2016-04-27 ENCOUNTER — Other Ambulatory Visit: Payer: Self-pay | Admitting: *Deleted

## 2016-04-27 VITALS — BP 124/83 | HR 77 | Temp 95.1°F | Resp 18 | Wt 150.6 lb

## 2016-04-27 DIAGNOSIS — M1990 Unspecified osteoarthritis, unspecified site: Secondary | ICD-10-CM

## 2016-04-27 DIAGNOSIS — I2510 Atherosclerotic heart disease of native coronary artery without angina pectoris: Secondary | ICD-10-CM | POA: Diagnosis not present

## 2016-04-27 DIAGNOSIS — Z79899 Other long term (current) drug therapy: Secondary | ICD-10-CM | POA: Insufficient documentation

## 2016-04-27 DIAGNOSIS — R0609 Other forms of dyspnea: Secondary | ICD-10-CM | POA: Diagnosis not present

## 2016-04-27 DIAGNOSIS — D649 Anemia, unspecified: Secondary | ICD-10-CM | POA: Diagnosis not present

## 2016-04-27 DIAGNOSIS — K219 Gastro-esophageal reflux disease without esophagitis: Secondary | ICD-10-CM | POA: Insufficient documentation

## 2016-04-27 DIAGNOSIS — Z803 Family history of malignant neoplasm of breast: Secondary | ICD-10-CM | POA: Diagnosis not present

## 2016-04-27 DIAGNOSIS — C61 Malignant neoplasm of prostate: Secondary | ICD-10-CM | POA: Insufficient documentation

## 2016-04-27 DIAGNOSIS — E785 Hyperlipidemia, unspecified: Secondary | ICD-10-CM | POA: Diagnosis not present

## 2016-04-27 DIAGNOSIS — E041 Nontoxic single thyroid nodule: Secondary | ICD-10-CM | POA: Insufficient documentation

## 2016-04-27 DIAGNOSIS — Z87891 Personal history of nicotine dependence: Secondary | ICD-10-CM | POA: Insufficient documentation

## 2016-04-27 DIAGNOSIS — R9721 Rising PSA following treatment for malignant neoplasm of prostate: Secondary | ICD-10-CM

## 2016-04-27 DIAGNOSIS — R918 Other nonspecific abnormal finding of lung field: Secondary | ICD-10-CM | POA: Diagnosis not present

## 2016-04-27 DIAGNOSIS — Z79818 Long term (current) use of other agents affecting estrogen receptors and estrogen levels: Secondary | ICD-10-CM | POA: Insufficient documentation

## 2016-04-27 DIAGNOSIS — E538 Deficiency of other specified B group vitamins: Secondary | ICD-10-CM | POA: Diagnosis not present

## 2016-04-27 DIAGNOSIS — Z800 Family history of malignant neoplasm of digestive organs: Secondary | ICD-10-CM | POA: Diagnosis not present

## 2016-04-27 NOTE — Progress Notes (Signed)
[facility] day:  04/27/16  Chief Complaint: Traycen Goyer is an 82 y.o. male with prostate cancer and B12 deficiency who is seen for review of review of interval thyroid biopsy.  HPI: The patient was last seen in the medical oncology clinic on 03/31/2016.  At that time, myeloma work-up was negative.  We discussed his thyroid ultrasound with recommendation to biopsy the left thyroid nodule.  Thyroid biopsy on 04/14/2016 was benign (Bethesda category II) with abundant foamy macrophages and colloid. 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  Symptomatically, he notes mild shortness of breath with exertion.  Weight is up 2 pounds.  He denies any pain.   Past Medical History:  Diagnosis Date  . Anemia 12/24/2014  . Arthritis   . B12 deficiency anemia 01/01/2015  . Cataract   . Coronary artery disease   . GERD (gastroesophageal reflux disease)   . Hyperlipidemia   . Hyperlipidemia   . Prostate cancer ([place])   . Thyroid nodule 03/14/2016    Past Surgical History:  Procedure Laterality Date  . PROSTATE BIOPSY      Family History  Problem Relation Age of Onset  . Cancer Mother   . Breast cancer Mother   . Kidney disease Father   . Diabetes Sister   . Stroke Sister   . Gastric cancer Sister     Social History:  reports that he has quit smoking. His smoking use included Cigarettes. He has a 7.50 pack-year smoking history. He has never used smokeless tobacco. He reports that he does not drink alcohol or use drugs.  He is alone today.  Allergies: No Known Allergies  Current Outpatient Prescriptions  Medication Sig Dispense Refill  . bicalutamide (CASODEX) 50 MG tablet TAKE 1 TABLET (50 MG TOTAL) BY MOUTH DAILY. 90 tablet 0   No current facility-administered medications for this visit.    ROS  GENERAL:  Feels good.  No fevers.  Mild night sweats (rare).  Weight up 2 pounds.  PERFORMANCE STATUS (ECOG):  1 HEENT:   No visual changes, runny nose, sore throat, mouth sores or tenderness. Lungs: Shortness of breath with exertion.  No cough.  No hemoptysis. Cardiac:  No chest pain, palpitations, orthopnea, or PND. GI:  No nausea, vomiting, diarrhea, constipation, melena or hematochezia. GU:  No urgency, frequency, dysuria, or hematuria. Musculoskeletal:  No back pain.  No joint pain.  No muscle tenderness. Extremities:  No pain or swelling. Skin:  No rashes or skin changes. Neuro:  No headache, numbness or weakness.  Sometimes balance or coordination issues. Endocrine:  Rare night sweats (stable).  No diabetes, thyroid issues, or hot flashes. Psych:  No mood changes, depression or anxiety. Pain:  No focal pain. Review of systems:  All other systems reviewed and found to be negative.  Physical Exam Blood pressure 124/83, pulse 77, temperature (!) 95.1 F (35.1 C), temperature source Tympanic, resp. rate 18, weight 150 lb 9.2 oz (68.3 kg).  GENERAL:  Well developed, well nourished gentleman sitting comfortably in the exam room in no acute distress. MENTAL STATUS:  Alert and oriented to person, place and time. HEAD:  Alopecia.  Slight gray mustache.  Normocephalic, atraumatic, face symmetric, no Cushingoid features. EYES:  Brown eyes (prominent) with slight lid lag. No conjunctivitis or scleral icterus. NEUROLOGICAL: Unremarkable. PSYCH:  Appropriate.   No visits with results within 3 Day(s) from this visit.  Latest known visit with results is:  Hospital Outpatient  Visit on 04/14/2016  Component Date Value Ref Range Status  . CYTOLOGY - NON GYN 04/17/2016    Final                   Value:Cytology - Non PAP CASE: [id number] PATIENT: Halford Decamp Non-Gyn Cytology Report     SPECIMEN SUBMITTED: A. Thyroid nodule complex, left; FNA  CLINICAL HISTORY: Chronic thyroid complex cyst  PRE-OPERATIVE DIAGNOSIS: Above  POST-OPERATIVE DIAGNOSIS: None provided.     DIAGNOSIS: A. COMPLEX  THYROID NODULE, LEFT; ULTRASOUND-GUIDED FINE-NEEDLE ASPIRATION: - BENIGN (BETHESDA CATEGORY II).  Note: The smear contains abundant foamy macrophages and colloid. The cytologic findings are compatible with a cystic colloid nodule. Correlation with sonographic findings is required.  ThinPrep, Diff Quik and Pap stained slides were reviewed.   GROSS DESCRIPTION:  A. Site: left thyroid nodule cyst Procedure: ultrasound Cytotechnologist: Ashlee Howze Specimen(s) collected: 1 Diff Quik stained slides 1 Pap stained slides Specimen labeled patient's name and medical record number :      Description: cloudy brown fluid with some brown particulate      Submitted f                         or:           ThinPrep    Final Diagnosis performed by Delorse Lek, MD.  Electronically signed 04/17/2016 10:36:39AM    The electronic signature indicates that the named Attending Pathologist has evaluated the specimen  Technical component performed at [facility], [address], [place], [place] [address] Lab: [phone] Dir: Darrick Penna. Evette Doffing, MD  Professional component performed at [facility], [facility], [place], [place], [place] [address] Lab: [phone] Dir: Dellia Nims. Reuel Derby, MD      Assessment:  The patient is a 82 year old African American gentleman with prostate cancer and a rising PSA on Lupron.  Prostate cancer was discovered approximately 20 years secondary to an elevated PSA.  He underwent biopsy at [facility] (no report available). Initial PSA was 774.    Abdomen and pelvic CT on 01/05/2006 revealed an enlarged and heterogeneous prostate with mass effect on the base of the bladder.  There was no adenopathy.  Chest, abdomen, and pelvic CT scan on 03/15/2016 revealed moderate centrilobular emphysema.  There were mild lucent mottled appearance of the osseous structures (cannot exclude multiple myeloma).  There were scattered pulmonary nodules  (4 mm or less in size).  There was a 2.7 cm low-attenuation left thyroid nodule.   Work-up on 03/17/2016 revealed no monoclonal protein.  SPEP revealed no monoclonal protein.  Kappa free light chains were 28.9, lambda free light chains 20.7 with a ratio of 1.40 (normal).  Immunoglobulins were normal (IgG 1391, IgA 282, IgM 29).  24 hour urine revealed no monoclonal protein.  Thyroid ultrasound on 03/22/2016 revealed a solitary 2.9 cm left lower pole nodule.  Thyroid biopsy on 04/14/2016 was benign (Bethesda category II) with abundant foamy macrophages and colloid. 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    He is on Lupron and Casodex.  He receives Lupron 45 mg every 6 months (last 12/27/2015).  PSA was 127.6 on 06/30/2014, 168.3 on 12/02/2014, 63.32 on 02/01/2015, 40.31 on 03/03/2015, 29.12 on 04/08/2015, 11.28 on 07/14/2015, 11.02 on 08/17/2015, 10.35 on 11/15/2015, and 14.37 on 02/15/2016.  He has a normocytic anemia. He was diagnosed  with B12 deficiency on 12/24/2014. B12 was 169 (low). Folate was 11.  He began B12 supplimentation on 02/01/2015 (last 04/11/2016).  Normal studies included a CMP, ferritin, iron studies, folate, and TSH. Diet is good. He eats meat 2 times a week. He denies any melena, hematochezia, or hematuria. Last colonoscopy was more than 10 years ago.  Guaiac cards x 3 were negative.  Symptomatically, he notes shortness of breath with exertion.  Weight has been stable x 2 months.  Plan:   1.  Discuss thyroid biopsy results. 2.  Continue Casodex. 3.  Continue B12 monthly (next due 05/09/2016). 4.  Preauth for Lupron (6 month injection) 5.  RTC on 06/06/2016 for MD assessment and labs (CBC with diff, CMP, PSA) + B12 6.  RTC on 06/29/2016 for Lupron.   Lequita Asal, MD  04/27/2016, 4:36 PM

## 2016-04-27 NOTE — Progress Notes (Signed)
Patient here today for thyroid biopsy results.

## 2016-05-09 ENCOUNTER — Ambulatory Visit: Payer: Self-pay | Admitting: Hematology and Oncology

## 2016-05-09 ENCOUNTER — Inpatient Hospital Stay: Payer: Medicare Other

## 2016-05-09 DIAGNOSIS — Z800 Family history of malignant neoplasm of digestive organs: Secondary | ICD-10-CM | POA: Diagnosis not present

## 2016-05-09 DIAGNOSIS — E538 Deficiency of other specified B group vitamins: Secondary | ICD-10-CM | POA: Diagnosis not present

## 2016-05-09 DIAGNOSIS — R918 Other nonspecific abnormal finding of lung field: Secondary | ICD-10-CM | POA: Diagnosis not present

## 2016-05-09 DIAGNOSIS — Z803 Family history of malignant neoplasm of breast: Secondary | ICD-10-CM | POA: Diagnosis not present

## 2016-05-09 DIAGNOSIS — K219 Gastro-esophageal reflux disease without esophagitis: Secondary | ICD-10-CM | POA: Diagnosis not present

## 2016-05-09 DIAGNOSIS — E785 Hyperlipidemia, unspecified: Secondary | ICD-10-CM | POA: Diagnosis not present

## 2016-05-09 DIAGNOSIS — R9721 Rising PSA following treatment for malignant neoplasm of prostate: Secondary | ICD-10-CM | POA: Diagnosis not present

## 2016-05-09 DIAGNOSIS — C61 Malignant neoplasm of prostate: Secondary | ICD-10-CM | POA: Diagnosis not present

## 2016-05-09 DIAGNOSIS — M1990 Unspecified osteoarthritis, unspecified site: Secondary | ICD-10-CM | POA: Diagnosis not present

## 2016-05-09 DIAGNOSIS — I2510 Atherosclerotic heart disease of native coronary artery without angina pectoris: Secondary | ICD-10-CM | POA: Diagnosis not present

## 2016-05-09 DIAGNOSIS — Z79818 Long term (current) use of other agents affecting estrogen receptors and estrogen levels: Secondary | ICD-10-CM | POA: Diagnosis not present

## 2016-05-09 DIAGNOSIS — D519 Vitamin B12 deficiency anemia, unspecified: Secondary | ICD-10-CM

## 2016-05-09 DIAGNOSIS — D649 Anemia, unspecified: Secondary | ICD-10-CM | POA: Diagnosis not present

## 2016-05-09 DIAGNOSIS — Z87891 Personal history of nicotine dependence: Secondary | ICD-10-CM | POA: Diagnosis not present

## 2016-05-09 DIAGNOSIS — Z79899 Other long term (current) drug therapy: Secondary | ICD-10-CM | POA: Diagnosis not present

## 2016-05-09 DIAGNOSIS — R0609 Other forms of dyspnea: Secondary | ICD-10-CM | POA: Diagnosis not present

## 2016-05-09 DIAGNOSIS — E041 Nontoxic single thyroid nodule: Secondary | ICD-10-CM | POA: Diagnosis not present

## 2016-05-09 MED ORDER — CYANOCOBALAMIN 1000 MCG/ML IJ SOLN
1000.0000 ug | Freq: Once | INTRAMUSCULAR | Status: AC
  Administered 2016-05-09: 1000 ug via INTRAMUSCULAR
  Filled 2016-05-09: qty 1

## 2016-05-20 ENCOUNTER — Encounter: Payer: Self-pay | Admitting: Hematology and Oncology

## 2016-06-06 ENCOUNTER — Inpatient Hospital Stay (HOSPITAL_BASED_OUTPATIENT_CLINIC_OR_DEPARTMENT_OTHER): Payer: Medicare Other | Admitting: Hematology and Oncology

## 2016-06-06 ENCOUNTER — Telehealth: Payer: Self-pay | Admitting: *Deleted

## 2016-06-06 ENCOUNTER — Encounter: Payer: Self-pay | Admitting: Hematology and Oncology

## 2016-06-06 ENCOUNTER — Other Ambulatory Visit: Payer: Self-pay | Admitting: Hematology and Oncology

## 2016-06-06 ENCOUNTER — Other Ambulatory Visit: Payer: Self-pay | Admitting: *Deleted

## 2016-06-06 ENCOUNTER — Inpatient Hospital Stay: Payer: Medicare Other | Attending: Hematology and Oncology

## 2016-06-06 ENCOUNTER — Inpatient Hospital Stay: Payer: Medicare Other

## 2016-06-06 VITALS — BP 100/65 | HR 82 | Temp 94.4°F | Resp 18 | Wt 154.3 lb

## 2016-06-06 DIAGNOSIS — Z800 Family history of malignant neoplasm of digestive organs: Secondary | ICD-10-CM | POA: Diagnosis not present

## 2016-06-06 DIAGNOSIS — M1990 Unspecified osteoarthritis, unspecified site: Secondary | ICD-10-CM | POA: Diagnosis not present

## 2016-06-06 DIAGNOSIS — E538 Deficiency of other specified B group vitamins: Secondary | ICD-10-CM | POA: Diagnosis not present

## 2016-06-06 DIAGNOSIS — E785 Hyperlipidemia, unspecified: Secondary | ICD-10-CM | POA: Diagnosis not present

## 2016-06-06 DIAGNOSIS — Z79818 Long term (current) use of other agents affecting estrogen receptors and estrogen levels: Secondary | ICD-10-CM | POA: Diagnosis not present

## 2016-06-06 DIAGNOSIS — C61 Malignant neoplasm of prostate: Secondary | ICD-10-CM

## 2016-06-06 DIAGNOSIS — D649 Anemia, unspecified: Secondary | ICD-10-CM | POA: Diagnosis not present

## 2016-06-06 DIAGNOSIS — Z87891 Personal history of nicotine dependence: Secondary | ICD-10-CM

## 2016-06-06 DIAGNOSIS — Z803 Family history of malignant neoplasm of breast: Secondary | ICD-10-CM | POA: Insufficient documentation

## 2016-06-06 DIAGNOSIS — D519 Vitamin B12 deficiency anemia, unspecified: Secondary | ICD-10-CM

## 2016-06-06 LAB — CBC WITH DIFFERENTIAL/PLATELET
Basophils Absolute: 0.1 10*3/uL (ref 0–0.1)
Basophils Relative: 2 %
Eosinophils Absolute: 0.4 10*3/uL (ref 0–0.7)
Eosinophils Relative: 9 %
HCT: 34.4 % — ABNORMAL LOW (ref 40.0–52.0)
Hemoglobin: 11.8 g/dL — ABNORMAL LOW (ref 13.0–18.0)
Lymphocytes Relative: 26 %
Lymphs Abs: 1 10*3/uL (ref 1.0–3.6)
MCH: 33.2 pg (ref 26.0–34.0)
MCHC: 34.5 g/dL (ref 32.0–36.0)
MCV: 96.4 fL (ref 80.0–100.0)
Monocytes Absolute: 0.5 10*3/uL (ref 0.2–1.0)
Monocytes Relative: 12 %
Neutro Abs: 2 10*3/uL (ref 1.4–6.5)
Neutrophils Relative %: 51 %
Platelets: 178 10*3/uL (ref 150–440)
RBC: 3.56 MIL/uL — ABNORMAL LOW (ref 4.40–5.90)
RDW: 14.4 % (ref 11.5–14.5)
WBC: 3.9 10*3/uL (ref 3.8–10.6)

## 2016-06-06 LAB — COMPREHENSIVE METABOLIC PANEL
ALT: 8 U/L — ABNORMAL LOW (ref 17–63)
AST: 15 U/L (ref 15–41)
Albumin: 4.1 g/dL (ref 3.5–5.0)
Alkaline Phosphatase: 64 U/L (ref 38–126)
Anion gap: 7 (ref 5–15)
BUN: 16 mg/dL (ref 6–20)
CO2: 26 mmol/L (ref 22–32)
Calcium: 9.4 mg/dL (ref 8.9–10.3)
Chloride: 104 mmol/L (ref 101–111)
Creatinine, Ser: 1.18 mg/dL (ref 0.61–1.24)
GFR calc Af Amer: >60 mL/min (ref >60)
GFR calc non Af Amer: 54 mL/min — ABNORMAL LOW (ref >60)
Glucose, Bld: 97 mg/dL (ref 65–99)
Potassium: 4.4 mmol/L (ref 3.5–5.1)
Sodium: 137 mmol/L (ref 135–145)
Total Bilirubin: 0.8 mg/dL (ref 0.3–1.2)
Total Protein: 7.3 g/dL (ref 6.5–8.1)

## 2016-06-06 LAB — FOLATE: Folate: 12 ng/mL (ref >5.9)

## 2016-06-06 LAB — PSA: PSA: 12.46 ng/mL — ABNORMAL HIGH (ref 0.00–4.00)

## 2016-06-06 MED ORDER — CYANOCOBALAMIN 1000 MCG/ML IJ SOLN
1000.0000 ug | Freq: Once | INTRAMUSCULAR | Status: AC
  Administered 2016-06-06: 1000 ug via INTRAMUSCULAR
  Filled 2016-06-06: qty 1

## 2016-06-06 NOTE — Telephone Encounter (Signed)
Called and spoke to secretary about pt and if he is going to get lupron shot on 11/7. Apparently he gets 6 month injection at the office but pt had told us that his insurance is not considered out of network and they will no longer be able to give it.  She states that the person incharge of that is off today and she will call back tom. And let us know the answer.

## 2016-06-06 NOTE — Progress Notes (Signed)
[place] [facility] day:  06/06/16  Chief Complaint: Charles Flores is an 82 y.o. male with prostate cancer and B12 deficiency who is seen for reassessment and monthly B12.  HPI: The patient was last seen in the medical oncology clinic on 04/27/2016.  At that time, thyroid biopsy results were reviewed.  There was no evidence of malignancy.  He continued his Casodex.  He last received B12 on 05/09/2016.  During the interim, he has done well.  He has rare mild sweats.    Past Medical History:  Diagnosis Date  . Anemia 12/24/2014  . Arthritis   . B12 deficiency anemia 01/01/2015  . Cataract   . Coronary artery disease   . GERD (gastroesophageal reflux disease)   . Hyperlipidemia   . Hyperlipidemia   . Prostate cancer ([place])   . Thyroid nodule 03/14/2016    Past Surgical History:  Procedure Laterality Date  . PROSTATE BIOPSY      Family History  Problem Relation Age of Onset  . Cancer Mother   . Breast cancer Mother   . Kidney disease Father   . Diabetes Sister   . Stroke Sister   . Gastric cancer Sister     Social History:  reports that he has quit smoking. His smoking use included Cigarettes. He has a 7.50 pack-year smoking history. He has never used smokeless tobacco. He reports that he does not drink alcohol or use drugs.  He is alone today.  Allergies: No Known Allergies  Current Outpatient Prescriptions  Medication Sig Dispense Refill  . bicalutamide (CASODEX) 50 MG tablet TAKE 1 TABLET (50 MG TOTAL) BY MOUTH DAILY. 90 tablet 0   No current facility-administered medications for this visit.    ROS  GENERAL:  Feels fine.  No fevers.  Describes sweats as "a little bit".  Weight down 4 pounds.  PERFORMANCE STATUS (ECOG):  1 HEENT:  No visual changes, runny nose, sore throat, mouth sores or tenderness. Lungs: Shortness of breath with exertion.  No cough.  No hemoptysis. Cardiac:  No chest pain, palpitations, orthopnea, or PND. GI:  No  nausea, vomiting, diarrhea, constipation, melena or hematochezia. GU:  No urgency, frequency, dysuria, or hematuria. Musculoskeletal:  No back pain.  No joint pain.  No muscle tenderness. Extremities:  No pain or swelling. Skin:  No rashes or skin changes. Neuro:  No headache, numbness or weakness.  Sometimes balance or coordination issues. Endocrine:  Rare night sweats (minimal).  No diabetes, thyroid issues, or hot flashes. Psych:  No mood changes, depression or anxiety. Pain:  No focal pain. Review of systems:  All other systems reviewed and found to be negative.  Physical Exam Blood pressure 100/65, pulse 82, temperature (!) 94.4 F (34.7 C), temperature source Tympanic, resp. rate 18, weight 154 lb 5.2 oz (70 kg).  GENERAL:  Well developed, well nourished gentleman sitting comfortably in the exam room in no acute distress. MENTAL STATUS:  Alert and oriented to person, place and time. HEAD:  Alopecia.  Slight gray mustache.  Normocephalic, atraumatic, face symmetric, no Cushingoid features. EYES:  Brown eyes (prominent) with slight lid lag.  Pupils equal round and reactive to light and accomodation.  No conjunctivitis or scleral icterus. ENT:  Oropharynx clear without lesion. Dentures.  Tongue normal. Mucous membranes moist. RESPIRATORY:  Clear to auscultation without rales, wheezes or rhonchi. CARDIOVASCULAR:  Regular rate and rhythm without murmur, rub or gallop. ABDOMEN:  Soft, non-tender, with active bowel sounds, and  no hepatosplenomegaly.  No masses. SKIN:  No rashes, ulcers or lesions. EXTREMITIES: No edema, skin discoloration or tenderness.  No palpable cords. LYMPH NODES: No palpable cervical, supraclavicular, axillary or inguinal adenopathy  NEUROLOGICAL: Unremarkable. PSYCH:  Appropriate   Appointment on 06/06/2016  Component Date Value Ref Range Status  . WBC 06/06/2016 3.9  3.8 - 10.6 K/uL Final  . RBC 06/06/2016 3.56* 4.40 - 5.90 MIL/uL Final  . Hemoglobin  06/06/2016 11.8* 13.0 - 18.0 g/dL Final  . HCT 06/06/2016 34.4* 40.0 - 52.0 % Final  . MCV 06/06/2016 96.4  80.0 - 100.0 fL Final  . MCH 06/06/2016 33.2  26.0 - 34.0 pg Final  . MCHC 06/06/2016 34.5  32.0 - 36.0 g/dL Final  . RDW 06/06/2016 14.4  11.5 - 14.5 % Final  . Platelets 06/06/2016 178  150 - 440 K/uL Final  . Neutrophils Relative % 06/06/2016 51  % Final  . Neutro Abs 06/06/2016 2.0  1.4 - 6.5 K/uL Final  . Lymphocytes Relative 06/06/2016 26  % Final  . Lymphs Abs 06/06/2016 1.0  1.0 - 3.6 K/uL Final  . Monocytes Relative 06/06/2016 12  % Final  . Monocytes Absolute 06/06/2016 0.5  0.2 - 1.0 K/uL Final  . Eosinophils Relative 06/06/2016 9  % Final  . Eosinophils Absolute 06/06/2016 0.4  0 - 0.7 K/uL Final  . Basophils Relative 06/06/2016 2  % Final  . Basophils Absolute 06/06/2016 0.1  0 - 0.1 K/uL Final  . Sodium 06/06/2016 137  135 - 145 mmol/L Final  . Potassium 06/06/2016 4.4  3.5 - 5.1 mmol/L Final  . Chloride 06/06/2016 104  101 - 111 mmol/L Final  . CO2 06/06/2016 26  22 - 32 mmol/L Final  . Glucose, Bld 06/06/2016 97  65 - 99 mg/dL Final  . BUN 06/06/2016 16  6 - 20 mg/dL Final  . Creatinine, Ser 06/06/2016 1.18  0.61 - 1.24 mg/dL Final  . Calcium 06/06/2016 9.4  8.9 - 10.3 mg/dL Final  . Total Protein 06/06/2016 7.3  6.5 - 8.1 g/dL Final  . Albumin 06/06/2016 4.1  3.5 - 5.0 g/dL Final  . AST 06/06/2016 15  15 - 41 U/L Final  . ALT 06/06/2016 8* 17 - 63 U/L Final  . Alkaline Phosphatase 06/06/2016 64  38 - 126 U/L Final  . Total Bilirubin 06/06/2016 0.8  0.3 - 1.2 mg/dL Final  . GFR calc non Af Amer 06/06/2016 54* >60 mL/min Final  . GFR calc Af Amer 06/06/2016 >60  >60 mL/min Final   Comment: (NOTE) The eGFR has been calculated using the CKD EPI equation. This calculation has not been validated in all clinical situations. eGFR's persistently <60 mL/min signify possible Chronic Kidney Disease.   . Anion gap 06/06/2016 7  5 - 15 Final    Assessment:  The  patient is a 82 year old African American gentleman with prostate cancer and a rising PSA on Lupron.  Prostate cancer was discovered approximately 20 years secondary to an elevated PSA.  He underwent biopsy at [facility] (no report available). Initial PSA was 774.    Abdomen and pelvic CT on 01/05/2006 revealed an enlarged and heterogeneous prostate with mass effect on the base of the bladder.  There was no adenopathy.  Chest, abdomen, and pelvic CT scan on 03/15/2016 revealed moderate centrilobular emphysema.  There were mild lucent mottled appearance of the osseous structures (cannot exclude multiple myeloma).  There were scattered pulmonary nodules (4 mm  or less in size).  There was a 2.7 cm low-attenuation left thyroid nodule.   Work-up on 03/17/2016 revealed no monoclonal protein.  SPEP revealed no monoclonal protein.  Kappa free light chains were 28.9, lambda free light chains 20.7 with a ratio of 1.40 (normal).  Immunoglobulins were normal (IgG 1391, IgA 282, IgM 29).  24 hour urine revealed no monoclonal protein.  Thyroid ultrasound on 03/22/2016 revealed a solitary 2.9 cm left lower pole nodule.  Thyroid biopsy on 04/14/2016 was benign (Bethesda category II) with abundant foamy macrophages and colloid. 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    He is on Lupron and Casodex.  He receives Lupron 45 mg every 6 months (last 12/27/2015).    PSA was 127.6 on 06/30/2014, 168.3 on 12/02/2014, 63.32 on 02/01/2015, 40.31 on 03/03/2015, 29.12 on 04/08/2015, 11.28 on 07/14/2015, 11.02 on 08/17/2015, 10.35 on 11/15/2015, 14.37 on 02/15/2016, and 12.46 on 06/06/2016.  He has a normocytic anemia. He was diagnosed with B12 deficiency on 12/24/2014. B12 was 169 (low). Folate was 11 on 12/24/2014 and 12 on 06/06/2016.  He began B12 supplimentation on 02/01/2015  (last 05/09/2016).  Normal studies included a CMP, ferritin, iron studies, folate, and TSH. Diet is good. He eats meat 2 times a week. He denies any melena, hematochezia, or hematuria. Last colonoscopy was more than 10 years ago.  Guaiac cards x 3 were negative in 02/2016.  Symptomatically, he feels fine.  He has rare mild sweats.  He has shortness of breath with exertion.   Plan:   1.  Labs today:  CBC with diff, CMP, folate, PSA. 2.  B12 today. 3.  Continue Casodex. 4.  Patient states that he is out of network for [facility] for receiving his Lupron in their clinic (appt date 06/27/2016).  Call to confirm. 5.  RTC on 07/04/2016 for B12 + Lupron (unless given by [facility]). 6.  RTC on 08/01/2016 for B12. 7.  RTC on 09/05/2016 for MD assessment, labs (CBC with diff, CMP, PSA), and B12.   Lequita Asal, MD  06/06/2016, 10:24 AM

## 2016-06-06 NOTE — Progress Notes (Signed)
Patient offers no complaints today. 

## 2016-06-18 ENCOUNTER — Other Ambulatory Visit: Payer: Self-pay | Admitting: Hematology and Oncology

## 2016-06-23 ENCOUNTER — Other Ambulatory Visit: Payer: Medicare Other

## 2016-06-23 ENCOUNTER — Other Ambulatory Visit: Payer: Self-pay | Admitting: Family Medicine

## 2016-06-23 DIAGNOSIS — C61 Malignant neoplasm of prostate: Secondary | ICD-10-CM

## 2016-06-23 DIAGNOSIS — Z7689 Persons encountering health services in other specified circumstances: Secondary | ICD-10-CM | POA: Diagnosis not present

## 2016-06-24 LAB — PSA: Prostate Specific Ag, Serum: 13.5 ng/mL — ABNORMAL HIGH (ref 0.0–4.0)

## 2016-06-26 ENCOUNTER — Telehealth: Payer: Self-pay

## 2016-06-26 NOTE — Telephone Encounter (Signed)
The patient needs to connect his PCP to get a bone scan. I will have Lattie Haw check into the Lupron issue. I don't have anything to do with that.  [name]

## 2016-06-26 NOTE — Telephone Encounter (Signed)
Testosterone added.  Can you help with the insurance situation?

## 2016-06-26 NOTE — Telephone Encounter (Signed)
Patient will have his Lupron injections at the cancer center because we are not in network for them with his insurance. They are already schd there.   [name]

## 2016-06-26 NOTE — Telephone Encounter (Signed)
-----   Message from Nori Riis, PA-C sent at 06/25/2016  7:45 PM EST ----- First of all, we need to add a testosterone level to the patient's blood work as requested by Dr. Louis Meckel at the May visit.  He is also to undergo a bone density scan to evaluate for osteoporosis which has not been done yet.  He also states that he is not within network with our office to receive Lupron injections.  I don't know if it's Dr. Louis Meckel that is not in his network or [facility] [facility]. We need to confirm this and make sure he is receiving his Lupron injections either with [place] or the cancer center.

## 2016-06-27 ENCOUNTER — Encounter: Payer: Self-pay | Admitting: Urology

## 2016-06-27 ENCOUNTER — Ambulatory Visit: Payer: Medicare Other | Admitting: Urology

## 2016-06-27 VITALS — BP 147/85 | HR 70 | Ht 71.0 in | Wt 154.6 lb

## 2016-06-27 DIAGNOSIS — C61 Malignant neoplasm of prostate: Secondary | ICD-10-CM | POA: Diagnosis not present

## 2016-06-27 NOTE — Progress Notes (Signed)
12:09 PM   Charles Flores 04-01-1927 [id number]  Referring provider: Christie Nottingham, [place], [place] [address]  Chief Complaint  Patient presents with  . Follow-up    Prostate cancer     HPI:  1 - Advanced Prostate Cancer - long h/o prostate cancer on androgen deprivation for years. Prostate still in situ. PSA at initial DX over 700.  Recent Course: 11/2014 PSA 168 ==> started casodex in addition to lupron, bone scan and CT w/o gross mets 03/2015 PSA 29.12; 05/2015 Lupron 45mg  08/17/15: 11.02 11/15/15: 10.35 Lupron injection 45 mg, 12/28/15 02/15/16:14 point 3 7 06/06/16:12 point 4 6 06/23/2016:13 point 5   Today "Charles Flores" is seen in f/u above. No interval bone pain, obstructive urinary symptoms, or hematuria.     PMH: Past Medical History:  Diagnosis Date  . Anemia 12/24/2014  . Arthritis   . B12 deficiency anemia 01/01/2015  . Cataract   . Coronary artery disease   . GERD (gastroesophageal reflux disease)   . Hyperlipidemia   . Hyperlipidemia   . Prostate cancer ([place])   . Thyroid nodule 03/14/2016    Surgical History: Past Surgical History:  Procedure Laterality Date  . PROSTATE BIOPSY      Home Medications:    Medication List       Accurate as of 06/27/16 12:09 PM. Always use your most recent med list.          bicalutamide 50 MG tablet Commonly known as:  CASODEX TAKE 1 TABLET (50 MG TOTAL) BY MOUTH DAILY.       Allergies: No Known Allergies  Family History: Family History  Problem Relation Age of Onset  . Cancer Mother   . Breast cancer Mother   . Kidney disease Father   . Diabetes Sister   . Stroke Sister   . Gastric cancer Sister     Social History:  reports that he has quit smoking. His smoking use included Cigarettes. He has a 7.50 pack-year smoking history. He has never used smokeless tobacco. He reports that he does not drink alcohol or use drugs.  ROS: UROLOGY Frequent Urination?: Yes Hard to postpone urination?:  No Burning/pain with urination?: No Get up at night to urinate?: No Leakage of urine?: No Urine stream starts and stops?: Yes Trouble starting stream?: No Do you have to strain to urinate?: No Blood in urine?: No Urinary tract infection?: No Sexually transmitted disease?: No Injury to kidneys or bladder?: No Painful intercourse?: No Weak stream?: No Erection problems?: No Penile pain?: No  Gastrointestinal Nausea?: No Vomiting?: No Indigestion/heartburn?: No Diarrhea?: No Constipation?: No  Constitutional Fever: No Night sweats?: No Weight loss?: No Fatigue?: No  Skin Skin rash/lesions?: No Itching?: No  Eyes Blurred vision?: No Double vision?: No  Ears/Nose/Throat Sore throat?: No Sinus problems?: No  Hematologic/Lymphatic Swollen glands?: No Easy bruising?: No  Cardiovascular Leg swelling?: Yes (bilateral swelling) Chest pain?: No  Respiratory Cough?: No Shortness of breath?: No  Endocrine Excessive thirst?: No  Musculoskeletal Back pain?: No Joint pain?: No  Neurological Headaches?: No Dizziness?: No  Psychologic Depression?: No Anxiety?: No  Physical Exam: BP (!) 147/85   Pulse 70   Ht 5\' 11"  (1.803 m)   Wt 70.1 kg (154 lb 9.6 oz)   BMI 21.56 kg/m   Constitutional:  Alert and oriented, No acute distress. HEENT: [place] AT, moist mucus membranes.  Trachea midline, no masses. Cardiovascular: No clubbing, cyanosis, or edema. Respiratory: Normal respiratory effort, no increased  work of breathing. GI: Abdomen is soft, nontender, nondistended, no abdominal masses GU: No CVA tenderness.   Skin: No rashes, bruises or suspicious lesions. Lymph: No cervical or inguinal adenopathy. Neurologic: Grossly intact, no focal deficits, moving all 4 extremities. Psychiatric: Normal mood and affect.  Laboratory Data: Lab Results  Component Value Date   WBC 3.9 06/06/2016   HGB 11.8 (L) 06/06/2016   HCT 34.4 (L) 06/06/2016   MCV 96.4 06/06/2016    PLT 178 06/06/2016    Lab Results  Component Value Date   CREATININE 1.18 06/06/2016    Lab Results  Component Value Date   PSA 12.46 (H) 06/06/2016   PSA 14.37 (H) 02/15/2016   PSA 10.35 (H) 11/15/2015    Lab Results  Component Value Date   TESTOSTERONE WILL FOLLOW 06/23/2016    No results found for: HGBA1C  Urinalysis No results found for: COLORURINE, APPEARANCEUR, LABSPEC, PHURINE, GLUCOSEU, HGBUR, BILIRUBINUR, KETONESUR, PROTEINUR, UROBILINOGEN, NITRITE, LEUKOCYTESUR   Assessment & Plan:    1. Advanced Prostate Cancer - The patient's prostate cancer is stable based on today's PSA results. He is tolerating the Lupron and Casodex without any significant issues. He is also taking calcium supplementation as recommended. At this point, we will stay the course. He is scheduled to have a Lupron injection at the cancer center next week. We'll plan to follow up the patient again in 6 months with a PSA and testosterone prior.  2 - RTC 58mos with PSA,T prior.  No Follow-up on file.  Ardis Hughs, [facility] [address] [place], [place] [address] [phone]   Cc: Dr. Nolon Stalls, MD

## 2016-06-28 LAB — TESTOSTERONE, TOTAL, LC/MS: Testosterone, total: 5.5 ng/dL — ABNORMAL LOW (ref 264.0–916.0)

## 2016-06-28 LAB — SPECIMEN STATUS REPORT

## 2016-07-04 ENCOUNTER — Inpatient Hospital Stay: Payer: Medicare Other | Attending: Hematology and Oncology

## 2016-07-04 DIAGNOSIS — Z79818 Long term (current) use of other agents affecting estrogen receptors and estrogen levels: Secondary | ICD-10-CM | POA: Insufficient documentation

## 2016-07-04 DIAGNOSIS — D649 Anemia, unspecified: Secondary | ICD-10-CM | POA: Diagnosis not present

## 2016-07-04 DIAGNOSIS — D519 Vitamin B12 deficiency anemia, unspecified: Secondary | ICD-10-CM | POA: Insufficient documentation

## 2016-07-04 DIAGNOSIS — C61 Malignant neoplasm of prostate: Secondary | ICD-10-CM | POA: Insufficient documentation

## 2016-07-04 DIAGNOSIS — E538 Deficiency of other specified B group vitamins: Secondary | ICD-10-CM | POA: Insufficient documentation

## 2016-07-04 MED ORDER — CYANOCOBALAMIN 1000 MCG/ML IJ SOLN
1000.0000 ug | Freq: Once | INTRAMUSCULAR | Status: AC
  Administered 2016-07-04: 1000 ug via INTRAMUSCULAR
  Filled 2016-07-04: qty 1

## 2016-07-04 MED ORDER — LEUPROLIDE ACETATE (4 MONTH) 30 MG IM KIT
30.0000 mg | PACK | Freq: Once | INTRAMUSCULAR | Status: AC
  Administered 2016-07-04: 30 mg via INTRAMUSCULAR
  Filled 2016-07-04: qty 30

## 2016-07-04 NOTE — Telephone Encounter (Signed)
If we are not in network for the patient, his insurance will not pay for office visits.  Are we transferring his care to the [facility]?

## 2016-08-01 ENCOUNTER — Inpatient Hospital Stay: Payer: Medicare Other | Attending: Hematology and Oncology

## 2016-08-01 DIAGNOSIS — D519 Vitamin B12 deficiency anemia, unspecified: Secondary | ICD-10-CM

## 2016-08-01 DIAGNOSIS — Z79818 Long term (current) use of other agents affecting estrogen receptors and estrogen levels: Secondary | ICD-10-CM | POA: Diagnosis not present

## 2016-08-01 DIAGNOSIS — C61 Malignant neoplasm of prostate: Secondary | ICD-10-CM | POA: Insufficient documentation

## 2016-08-01 MED ORDER — CYANOCOBALAMIN 1000 MCG/ML IJ SOLN
1000.0000 ug | Freq: Once | INTRAMUSCULAR | Status: AC
  Administered 2016-08-01: 1000 ug via INTRAMUSCULAR
  Filled 2016-08-01: qty 1

## 2016-08-23 DIAGNOSIS — H40003 Preglaucoma, unspecified, bilateral: Secondary | ICD-10-CM | POA: Diagnosis not present

## 2016-09-05 ENCOUNTER — Inpatient Hospital Stay (HOSPITAL_BASED_OUTPATIENT_CLINIC_OR_DEPARTMENT_OTHER): Payer: Medicare Other | Admitting: Hematology and Oncology

## 2016-09-05 ENCOUNTER — Inpatient Hospital Stay: Payer: Medicare Other

## 2016-09-05 ENCOUNTER — Inpatient Hospital Stay: Payer: Medicare Other | Attending: Hematology and Oncology

## 2016-09-05 VITALS — BP 101/60 | HR 77 | Temp 94.6°F | Resp 18 | Wt 155.9 lb

## 2016-09-05 DIAGNOSIS — M1990 Unspecified osteoarthritis, unspecified site: Secondary | ICD-10-CM | POA: Diagnosis not present

## 2016-09-05 DIAGNOSIS — R918 Other nonspecific abnormal finding of lung field: Secondary | ICD-10-CM | POA: Insufficient documentation

## 2016-09-05 DIAGNOSIS — E041 Nontoxic single thyroid nodule: Secondary | ICD-10-CM | POA: Diagnosis not present

## 2016-09-05 DIAGNOSIS — D519 Vitamin B12 deficiency anemia, unspecified: Secondary | ICD-10-CM

## 2016-09-05 DIAGNOSIS — Z79899 Other long term (current) drug therapy: Secondary | ICD-10-CM

## 2016-09-05 DIAGNOSIS — Z79818 Long term (current) use of other agents affecting estrogen receptors and estrogen levels: Secondary | ICD-10-CM

## 2016-09-05 DIAGNOSIS — Z800 Family history of malignant neoplasm of digestive organs: Secondary | ICD-10-CM

## 2016-09-05 DIAGNOSIS — Z87891 Personal history of nicotine dependence: Secondary | ICD-10-CM | POA: Diagnosis not present

## 2016-09-05 DIAGNOSIS — R232 Flushing: Secondary | ICD-10-CM | POA: Insufficient documentation

## 2016-09-05 DIAGNOSIS — I2510 Atherosclerotic heart disease of native coronary artery without angina pectoris: Secondary | ICD-10-CM

## 2016-09-05 DIAGNOSIS — E785 Hyperlipidemia, unspecified: Secondary | ICD-10-CM | POA: Diagnosis not present

## 2016-09-05 DIAGNOSIS — R0609 Other forms of dyspnea: Secondary | ICD-10-CM

## 2016-09-05 DIAGNOSIS — C61 Malignant neoplasm of prostate: Secondary | ICD-10-CM | POA: Diagnosis not present

## 2016-09-05 DIAGNOSIS — K219 Gastro-esophageal reflux disease without esophagitis: Secondary | ICD-10-CM

## 2016-09-05 DIAGNOSIS — Z803 Family history of malignant neoplasm of breast: Secondary | ICD-10-CM

## 2016-09-05 LAB — CBC WITH DIFFERENTIAL/PLATELET
Basophils Absolute: 0 10*3/uL (ref 0–0.1)
Basophils Relative: 1 %
Eosinophils Absolute: 0.3 10*3/uL (ref 0–0.7)
Eosinophils Relative: 7 %
HCT: 35.6 % — ABNORMAL LOW (ref 40.0–52.0)
Hemoglobin: 12.2 g/dL — ABNORMAL LOW (ref 13.0–18.0)
Lymphocytes Relative: 29 %
Lymphs Abs: 1.1 10*3/uL (ref 1.0–3.6)
MCH: 33 pg (ref 26.0–34.0)
MCHC: 34.3 g/dL (ref 32.0–36.0)
MCV: 96.2 fL (ref 80.0–100.0)
Monocytes Absolute: 0.5 10*3/uL (ref 0.2–1.0)
Monocytes Relative: 13 %
Neutro Abs: 1.8 10*3/uL (ref 1.4–6.5)
Neutrophils Relative %: 50 %
Platelets: 175 10*3/uL (ref 150–440)
RBC: 3.7 MIL/uL — ABNORMAL LOW (ref 4.40–5.90)
RDW: 13.7 % (ref 11.5–14.5)
WBC: 3.6 10*3/uL — ABNORMAL LOW (ref 3.8–10.6)

## 2016-09-05 LAB — COMPREHENSIVE METABOLIC PANEL
ALT: 8 U/L — ABNORMAL LOW (ref 17–63)
AST: 17 U/L (ref 15–41)
Albumin: 4.4 g/dL (ref 3.5–5.0)
Alkaline Phosphatase: 66 U/L (ref 38–126)
Anion gap: 5 (ref 5–15)
BUN: 21 mg/dL — ABNORMAL HIGH (ref 6–20)
CO2: 25 mmol/L (ref 22–32)
Calcium: 9.4 mg/dL (ref 8.9–10.3)
Chloride: 107 mmol/L (ref 101–111)
Creatinine, Ser: 1.36 mg/dL — ABNORMAL HIGH (ref 0.61–1.24)
GFR calc Af Amer: 52 mL/min — ABNORMAL LOW (ref >60)
GFR calc non Af Amer: 45 mL/min — ABNORMAL LOW (ref >60)
Glucose, Bld: 101 mg/dL — ABNORMAL HIGH (ref 65–99)
Potassium: 4.8 mmol/L (ref 3.5–5.1)
Sodium: 137 mmol/L (ref 135–145)
Total Bilirubin: 0.7 mg/dL (ref 0.3–1.2)
Total Protein: 7.5 g/dL (ref 6.5–8.1)

## 2016-09-05 LAB — PSA: PSA: 16.63 ng/mL — ABNORMAL HIGH (ref 0.00–4.00)

## 2016-09-05 MED ORDER — CYANOCOBALAMIN 1000 MCG/ML IJ SOLN
1000.0000 ug | Freq: Once | INTRAMUSCULAR | Status: AC
  Administered 2016-09-05: 1000 ug via INTRAMUSCULAR
  Filled 2016-09-05: qty 1

## 2016-09-05 NOTE — Progress Notes (Signed)
[place] [facility] day:  09/05/16  Chief Complaint: Charles Flores is an 83 y.o. male with prostate cancer and B12 deficiency who is seen for  3 month assessment and continuation of monthly B12.  HPI: The patient was last seen in the medical oncology clinic on 06/06/2016.  At that time, he was doing well on Casodex.  He had rare sweats.  PSA was 12.46.  He received Lupron 30 mg IM on 07/04/2016.  He has continued monthly B12.  He last received B12 on 08/01/2016.  Folate was 12 on 06/06/2016.  During the interim,  He notes "no problems".  He has few sweats.  He denies any bone pain.   Past Medical History:  Diagnosis Date  . Anemia 12/24/2014  . Arthritis   . B12 deficiency anemia 01/01/2015  . Cataract   . Coronary artery disease   . GERD (gastroesophageal reflux disease)   . Hyperlipidemia   . Hyperlipidemia   . Prostate cancer ([place])   . Thyroid nodule 03/14/2016    Past Surgical History:  Procedure Laterality Date  . PROSTATE BIOPSY      Family History  Problem Relation Age of Onset  . Cancer Mother   . Breast cancer Mother   . Kidney disease Father   . Diabetes Sister   . Stroke Sister   . Gastric cancer Sister     Social History:  reports that he has quit smoking. His smoking use included Cigarettes. He has a 7.50 pack-year smoking history. He has never used smokeless tobacco. He reports that he does not drink alcohol or use drugs.  He lives in [place].  He is alone today.  Allergies: No Known Allergies  Current Outpatient Prescriptions  Medication Sig Dispense Refill  . bicalutamide (CASODEX) 50 MG tablet TAKE 1 TABLET (50 MG TOTAL) BY MOUTH DAILY. 90 tablet 0   No current facility-administered medications for this visit.    ROS  GENERAL:  Feels fine.  No fevers.  "Little sweats".  Weight up 1 pound.  PERFORMANCE STATUS (ECOG):  1 HEENT:  No visual changes, runny nose, sore throat, mouth sores or tenderness. Lungs:  Shortness of breath with exertion.  No cough.  No hemoptysis. Cardiac:  No chest pain, palpitations, orthopnea, or PND. GI:  No nausea, vomiting, diarrhea, constipation, melena or hematochezia. GU:  No urgency, frequency, dysuria, or hematuria. Musculoskeletal:  No back pain.  No joint pain.  No muscle tenderness. Extremities:  No pain or swelling. Skin:  No rashes or skin changes. Neuro:  No headache, numbness or weakness.  Sometimes balance or coordination issues. Endocrine:  Rare night sweats (minimal).  No diabetes, thyroid issues, or hot flashes. Psych:  No mood changes, depression or anxiety. Pain:  No focal pain. Review of systems:  All other systems reviewed and found to be negative.  Physical Exam Blood pressure 101/60, pulse 77, temperature (!) 94.6 F (34.8 C), temperature source Tympanic, resp. rate 18, weight 155 lb 13.8 oz (70.7 kg).  GENERAL:  Well developed, well nourished gentleman sitting comfortably in the exam room in no acute distress. MENTAL STATUS:  Alert and oriented to person, place and time. HEAD:  Alopecia.  Slight gray mustache.  Normocephalic, atraumatic, face symmetric, no Cushingoid features. EYES:  Brown eyes (prominent) with slight lid lag.  Pupils equal round and reactive to light and accomodation.  No conjunctivitis or scleral icterus. ENT:  Oropharynx clear without lesion. Dentures.  Tongue normal. Mucous membranes moist.  RESPIRATORY:  Clear to auscultation without rales, wheezes or rhonchi. CARDIOVASCULAR:  Regular rate and rhythm without murmur, rub or gallop. ABDOMEN:  Soft, non-tender, with active bowel sounds, and no hepatosplenomegaly.  No masses. SKIN:  No rashes, ulcers or lesions. EXTREMITIES: No edema, skin discoloration or tenderness.  No palpable cords. LYMPH NODES: No palpable cervical, supraclavicular, axillary or inguinal adenopathy  NEUROLOGICAL: Unremarkable. PSYCH:  Appropriate   Appointment on 09/05/2016  Component Date Value Ref  Range Status  . WBC 09/05/2016 3.6* 3.8 - 10.6 K/uL Final  . RBC 09/05/2016 3.70* 4.40 - 5.90 MIL/uL Final  . Hemoglobin 09/05/2016 12.2* 13.0 - 18.0 g/dL Final  . HCT 09/05/2016 35.6* 40.0 - 52.0 % Final  . MCV 09/05/2016 96.2  80.0 - 100.0 fL Final  . MCH 09/05/2016 33.0  26.0 - 34.0 pg Final  . MCHC 09/05/2016 34.3  32.0 - 36.0 g/dL Final  . RDW 09/05/2016 13.7  11.5 - 14.5 % Final  . Platelets 09/05/2016 175  150 - 440 K/uL Final  . Neutrophils Relative % 09/05/2016 50  % Final  . Neutro Abs 09/05/2016 1.8  1.4 - 6.5 K/uL Final  . Lymphocytes Relative 09/05/2016 29  % Final  . Lymphs Abs 09/05/2016 1.1  1.0 - 3.6 K/uL Final  . Monocytes Relative 09/05/2016 13  % Final  . Monocytes Absolute 09/05/2016 0.5  0.2 - 1.0 K/uL Final  . Eosinophils Relative 09/05/2016 7  % Final  . Eosinophils Absolute 09/05/2016 0.3  0 - 0.7 K/uL Final  . Basophils Relative 09/05/2016 1  % Final  . Basophils Absolute 09/05/2016 0.0  0 - 0.1 K/uL Final  . Sodium 09/05/2016 137  135 - 145 mmol/L Final  . Potassium 09/05/2016 4.8  3.5 - 5.1 mmol/L Final  . Chloride 09/05/2016 107  101 - 111 mmol/L Final  . CO2 09/05/2016 25  22 - 32 mmol/L Final  . Glucose, Bld 09/05/2016 101* 65 - 99 mg/dL Final  . BUN 09/05/2016 21* 6 - 20 mg/dL Final  . Creatinine, Ser 09/05/2016 1.36* 0.61 - 1.24 mg/dL Final  . Calcium 09/05/2016 9.4  8.9 - 10.3 mg/dL Final  . Total Protein 09/05/2016 7.5  6.5 - 8.1 g/dL Final  . Albumin 09/05/2016 4.4  3.5 - 5.0 g/dL Final  . AST 09/05/2016 17  15 - 41 U/L Final  . ALT 09/05/2016 8* 17 - 63 U/L Final  . Alkaline Phosphatase 09/05/2016 66  38 - 126 U/L Final  . Total Bilirubin 09/05/2016 0.7  0.3 - 1.2 mg/dL Final  . GFR calc non Af Amer 09/05/2016 45* >60 mL/min Final  . GFR calc Af Amer 09/05/2016 52* >60 mL/min Final   Comment: (NOTE) The eGFR has been calculated using the CKD EPI equation. This calculation has not been validated in all clinical situations. eGFR's persistently  <60 mL/min signify possible Chronic Kidney Disease.   . Anion gap 09/05/2016 5  5 - 15 Final    Assessment:  The patient is a 83 year old African American gentleman with prostate cancer and a rising PSA on Lupron.  Prostate cancer was discovered approximately 20 years secondary to an elevated PSA.  He underwent biopsy at [facility] (no report available). Initial PSA was 774.    Abdomen and pelvic CT on 01/05/2006 revealed an enlarged and heterogeneous prostate with mass effect on the base of the bladder.  There was no adenopathy.  Chest, abdomen, and pelvic CT scan on 03/15/2016 revealed moderate  centrilobular emphysema.  There were mild lucent mottled appearance of the osseous structures (cannot exclude multiple myeloma).  There were scattered pulmonary nodules (4 mm or less in size).  There was a 2.7 cm low-attenuation left thyroid nodule.   Work-up on 03/17/2016 revealed no monoclonal protein.  SPEP revealed no monoclonal protein.  Kappa free light chains were 28.9, lambda free light chains 20.7 with a ratio of 1.40 (normal).  Immunoglobulins were normal (IgG 1391, IgA 282, IgM 29).  24 hour urine revealed no monoclonal protein.  Thyroid ultrasound on 03/22/2016 revealed a solitary 2.9 cm left lower pole nodule.  Thyroid biopsy on 04/14/2016 was benign (Bethesda category II) with abundant foamy macrophages and colloid. 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    He is on Lupron and Casodex.  He receives Lupron 30 mg every 4 months (last 07/04/2016).    PSA has been followed: 127.6 on 06/30/2014, 168.3 on 12/02/2014, 63.32 on 02/01/2015, 40.31 on 03/03/2015, 29.12 on 04/08/2015, 11.28 on 07/14/2015, 11.02 on 08/17/2015, 10.35 on 11/15/2015, 14.37 on 02/15/2016, 12.46 on 06/06/2016, 13.5 on 06/23/2016, and 16.63 on 09/05/2016.  He has a normocytic  anemia. He was diagnosed with B12 deficiency on 12/24/2014. B12 was 169 (low). Folate was 11 on 12/24/2014 and 12 on 06/06/2016.  He began B12 supplimentation on 02/01/2015 (last 05/09/2016).  Normal studies included a CMP, ferritin, iron studies, folate, and TSH. Diet is good. He eats meat 2 times a week. He denies any melena, hematochezia, or hematuria. Last colonoscopy was more than 10 years ago.  Guaiac cards x 3 were negative in 02/2016.  Symptomatically, he feels good.  He has rare mild sweats.  He has chronic shortness of breath with exertion.   Plan:   1.  Labs today:  CBC with diff, CMP, folate, PSA. 2.  B12 today. 3.  Continue Casodex. 4.  Next Lupron due on 10/24/2016. 5.  Anticipate remiaging studies in 12/2016 - 02/2017. 6.  RTC on 10/03/2016 for B12  7.  RTC on 10/31/2016 for MD assessment, labs (CBC with diff, CMP, PSA), Lupron and B12.   Lequita Asal, MD  09/05/2016, 10:35 AM

## 2016-09-05 NOTE — Progress Notes (Signed)
Patient offers no complaints today. 

## 2016-09-11 DIAGNOSIS — E782 Mixed hyperlipidemia: Secondary | ICD-10-CM | POA: Diagnosis not present

## 2016-09-11 DIAGNOSIS — D519 Vitamin B12 deficiency anemia, unspecified: Secondary | ICD-10-CM | POA: Diagnosis not present

## 2016-09-11 DIAGNOSIS — K5900 Constipation, unspecified: Secondary | ICD-10-CM | POA: Diagnosis not present

## 2016-09-11 DIAGNOSIS — C61 Malignant neoplasm of prostate: Secondary | ICD-10-CM | POA: Diagnosis not present

## 2016-09-16 ENCOUNTER — Other Ambulatory Visit: Payer: Self-pay | Admitting: Hematology and Oncology

## 2016-10-03 ENCOUNTER — Inpatient Hospital Stay: Payer: Medicare Other | Attending: Hematology and Oncology

## 2016-10-03 DIAGNOSIS — C61 Malignant neoplasm of prostate: Secondary | ICD-10-CM | POA: Insufficient documentation

## 2016-10-03 DIAGNOSIS — D519 Vitamin B12 deficiency anemia, unspecified: Secondary | ICD-10-CM | POA: Diagnosis not present

## 2016-10-03 DIAGNOSIS — Z79818 Long term (current) use of other agents affecting estrogen receptors and estrogen levels: Secondary | ICD-10-CM | POA: Diagnosis not present

## 2016-10-03 MED ORDER — CYANOCOBALAMIN 1000 MCG/ML IJ SOLN
1000.0000 ug | Freq: Once | INTRAMUSCULAR | Status: AC
  Administered 2016-10-03: 1000 ug via INTRAMUSCULAR
  Filled 2016-10-03: qty 1

## 2016-10-22 ENCOUNTER — Encounter: Payer: Self-pay | Admitting: Hematology and Oncology

## 2016-10-31 ENCOUNTER — Inpatient Hospital Stay: Payer: Medicare Other

## 2016-10-31 ENCOUNTER — Inpatient Hospital Stay: Payer: Medicare Other | Admitting: Hematology and Oncology

## 2016-11-07 ENCOUNTER — Inpatient Hospital Stay: Payer: Medicare Other | Attending: Hematology and Oncology

## 2016-11-07 ENCOUNTER — Inpatient Hospital Stay (HOSPITAL_BASED_OUTPATIENT_CLINIC_OR_DEPARTMENT_OTHER): Payer: Medicare Other | Admitting: Hematology and Oncology

## 2016-11-07 ENCOUNTER — Other Ambulatory Visit: Payer: Self-pay | Admitting: Hematology and Oncology

## 2016-11-07 ENCOUNTER — Inpatient Hospital Stay: Payer: Medicare Other

## 2016-11-07 VITALS — BP 103/70 | HR 86 | Temp 93.8°F | Resp 18 | Wt 156.4 lb

## 2016-11-07 DIAGNOSIS — Z87891 Personal history of nicotine dependence: Secondary | ICD-10-CM

## 2016-11-07 DIAGNOSIS — R232 Flushing: Secondary | ICD-10-CM | POA: Diagnosis not present

## 2016-11-07 DIAGNOSIS — K219 Gastro-esophageal reflux disease without esophagitis: Secondary | ICD-10-CM | POA: Diagnosis not present

## 2016-11-07 DIAGNOSIS — D519 Vitamin B12 deficiency anemia, unspecified: Secondary | ICD-10-CM

## 2016-11-07 DIAGNOSIS — C61 Malignant neoplasm of prostate: Secondary | ICD-10-CM | POA: Diagnosis not present

## 2016-11-07 DIAGNOSIS — Z800 Family history of malignant neoplasm of digestive organs: Secondary | ICD-10-CM | POA: Insufficient documentation

## 2016-11-07 DIAGNOSIS — Z803 Family history of malignant neoplasm of breast: Secondary | ICD-10-CM

## 2016-11-07 DIAGNOSIS — I2510 Atherosclerotic heart disease of native coronary artery without angina pectoris: Secondary | ICD-10-CM | POA: Diagnosis not present

## 2016-11-07 DIAGNOSIS — Z79899 Other long term (current) drug therapy: Secondary | ICD-10-CM

## 2016-11-07 DIAGNOSIS — R0600 Dyspnea, unspecified: Secondary | ICD-10-CM

## 2016-11-07 DIAGNOSIS — E785 Hyperlipidemia, unspecified: Secondary | ICD-10-CM | POA: Diagnosis not present

## 2016-11-07 DIAGNOSIS — Z79818 Long term (current) use of other agents affecting estrogen receptors and estrogen levels: Secondary | ICD-10-CM

## 2016-11-07 LAB — COMPREHENSIVE METABOLIC PANEL
ALT: 8 U/L — ABNORMAL LOW (ref 17–63)
AST: 15 U/L (ref 15–41)
Albumin: 3.9 g/dL (ref 3.5–5.0)
Alkaline Phosphatase: 69 U/L (ref 38–126)
Anion gap: 4 — ABNORMAL LOW (ref 5–15)
BUN: 16 mg/dL (ref 6–20)
CO2: 27 mmol/L (ref 22–32)
Calcium: 9.2 mg/dL (ref 8.9–10.3)
Chloride: 106 mmol/L (ref 101–111)
Creatinine, Ser: 1.29 mg/dL — ABNORMAL HIGH (ref 0.61–1.24)
GFR calc Af Amer: 56 mL/min — ABNORMAL LOW (ref >60)
GFR calc non Af Amer: 48 mL/min — ABNORMAL LOW (ref >60)
Glucose, Bld: 96 mg/dL (ref 65–99)
Potassium: 3.9 mmol/L (ref 3.5–5.1)
Sodium: 137 mmol/L (ref 135–145)
Total Bilirubin: 0.6 mg/dL (ref 0.3–1.2)
Total Protein: 7.4 g/dL (ref 6.5–8.1)

## 2016-11-07 LAB — CBC WITH DIFFERENTIAL/PLATELET
Basophils Absolute: 0.1 10*3/uL (ref 0–0.1)
Basophils Relative: 2 %
Eosinophils Absolute: 0.2 10*3/uL (ref 0–0.7)
Eosinophils Relative: 6 %
HCT: 34.6 % — ABNORMAL LOW (ref 40.0–52.0)
Hemoglobin: 11.9 g/dL — ABNORMAL LOW (ref 13.0–18.0)
Lymphocytes Relative: 28 %
Lymphs Abs: 1.1 10*3/uL (ref 1.0–3.6)
MCH: 33.2 pg (ref 26.0–34.0)
MCHC: 34.3 g/dL (ref 32.0–36.0)
MCV: 96.7 fL (ref 80.0–100.0)
Monocytes Absolute: 0.5 10*3/uL (ref 0.2–1.0)
Monocytes Relative: 12 %
Neutro Abs: 2.1 10*3/uL (ref 1.4–6.5)
Neutrophils Relative %: 52 %
Platelets: 186 10*3/uL (ref 150–440)
RBC: 3.58 MIL/uL — ABNORMAL LOW (ref 4.40–5.90)
RDW: 13.9 % (ref 11.5–14.5)
WBC: 4 10*3/uL (ref 3.8–10.6)

## 2016-11-07 LAB — PSA: PSA: 20.77 ng/mL — ABNORMAL HIGH (ref 0.00–4.00)

## 2016-11-07 MED ORDER — LEUPROLIDE ACETATE (4 MONTH) 30 MG IM KIT
30.0000 mg | PACK | Freq: Once | INTRAMUSCULAR | Status: AC
  Administered 2016-11-07: 30 mg via INTRAMUSCULAR
  Filled 2016-11-07: qty 30

## 2016-11-07 MED ORDER — CYANOCOBALAMIN 1000 MCG/ML IJ SOLN
1000.0000 ug | Freq: Once | INTRAMUSCULAR | Status: AC
  Administered 2016-11-07: 1000 ug via INTRAMUSCULAR
  Filled 2016-11-07: qty 1

## 2016-11-07 NOTE — Progress Notes (Signed)
Patient offers no complaints today. 

## 2016-11-07 NOTE — Progress Notes (Signed)
[facility] day:  11/07/2016  Chief Complaint: Charles Flores is an 83 y.o. male with prostate cancer and B12 deficiency who is seen for  3 month assessment and continuation of monthly B12.  HPI: The patient was last seen in the medical oncology clinic on  09/06/2015.  At that time, he was doing well on Casodex.  He denied any pain.  He had rare sweats.  PSA was 16.63.  We discussed plans for imaging in 12/2016.  He has continued monthly B12.  He last received B12 on 10/03/2016.  During the interim, he notes "no problems".  He notes "once in awhile hot flashes".  He denies any bone pain.   Past Medical History:  Diagnosis Date  . Anemia 12/24/2014  . Arthritis   . B12 deficiency anemia 01/01/2015  . Cataract   . Coronary artery disease   . GERD (gastroesophageal reflux disease)   . Hyperlipidemia   . Hyperlipidemia   . Prostate cancer ([place])   . Thyroid nodule 03/14/2016    Past Surgical History:  Procedure Laterality Date  . PROSTATE BIOPSY      Family History  Problem Relation Age of Onset  . Cancer Mother   . Breast cancer Mother   . Kidney disease Father   . Diabetes Sister   . Stroke Sister   . Gastric cancer Sister     Social History:  reports that he has quit smoking. His smoking use included Cigarettes. He has a 7.50 pack-year smoking history. He has never used smokeless tobacco. He reports that he does not drink alcohol or use drugs.  He lives in [place].  He is alone today.  Allergies: No Known Allergies  Current Outpatient Prescriptions  Medication Sig Dispense Refill  . bicalutamide (CASODEX) 50 MG tablet TAKE 1 TABLET (50 MG TOTAL) BY MOUTH DAILY. 90 tablet 0  . polyethylene glycol (MIRALAX / GLYCOLAX) packet Take 3,350 packets by mouth continuous as needed.  5   No current facility-administered medications for this visit.    ROS  GENERAL:  Feels "good".  No fevers.  Weight up 1 pound.  PERFORMANCE STATUS  (ECOG):  1 HEENT:  No visual changes, runny nose, sore throat, mouth sores or tenderness. Lungs: Shortness of breath with exertion (no change).  No cough.  No hemoptysis. Cardiac:  No chest pain, palpitations, orthopnea, or PND. GI:  No nausea, vomiting, diarrhea, constipation, melena or hematochezia. GU:  No urgency, frequency, dysuria, or hematuria. Musculoskeletal:  No back pain.  No joint pain.  No muscle tenderness. Extremities:  No pain or swelling. Skin:  No rashes or skin changes. Neuro:  No headache, numbness or weakness.  Sometimes balance or coordination issues. Endocrine:  No diabetes, thyroid issues.  Rare hot flashes. Psych:  No mood changes, depression or anxiety. Pain:  No focal pain. Review of systems:  All other systems reviewed and found to be negative.  Physical Exam Blood pressure 103/70, pulse 86, temperature (!) 93.8 F (34.3 C), temperature source Tympanic, resp. rate 18, weight 156 lb 6 oz (70.9 kg).  GENERAL:  Well developed, well nourished gentleman sitting comfortably in the exam room in no acute distress. MENTAL STATUS:  Alert and oriented to person, place and time. HEAD:  Alopecia.  Slight gray mustache.  Normocephalic, atraumatic, face symmetric, no Cushingoid features. EYES:  Brown eyes (prominent) with slight lid lag.  Pupils equal round and reactive to light and accomodation.  No conjunctivitis or scleral  icterus. ENT:  Oropharynx clear without lesion. Dentures.  Tongue normal. Mucous membranes moist. RESPIRATORY:  Clear to auscultation without rales, wheezes or rhonchi. CARDIOVASCULAR:  Regular rate and rhythm without murmur, rub or gallop. ABDOMEN:  Soft, non-tender, with active bowel sounds, and no hepatosplenomegaly.  No masses. SKIN:  No rashes, ulcers or lesions. EXTREMITIES: No edema, skin discoloration or tenderness.  No palpable cords. LYMPH NODES: No palpable cervical, supraclavicular, axillary or inguinal adenopathy  NEUROLOGICAL:  Unremarkable. PSYCH:  Appropriate   Appointment on 11/07/2016  Component Date Value Ref Range Status  . Sodium 11/07/2016 137  135 - 145 mmol/L Final  . Potassium 11/07/2016 3.9  3.5 - 5.1 mmol/L Final  . Chloride 11/07/2016 106  101 - 111 mmol/L Final  . CO2 11/07/2016 27  22 - 32 mmol/L Final  . Glucose, Bld 11/07/2016 96  65 - 99 mg/dL Final  . BUN 11/07/2016 16  6 - 20 mg/dL Final  . Creatinine, Ser 11/07/2016 1.29* 0.61 - 1.24 mg/dL Final  . Calcium 11/07/2016 9.2  8.9 - 10.3 mg/dL Final  . Total Protein 11/07/2016 7.4  6.5 - 8.1 g/dL Final  . Albumin 11/07/2016 3.9  3.5 - 5.0 g/dL Final  . AST 11/07/2016 15  15 - 41 U/L Final  . ALT 11/07/2016 8* 17 - 63 U/L Final  . Alkaline Phosphatase 11/07/2016 69  38 - 126 U/L Final  . Total Bilirubin 11/07/2016 0.6  0.3 - 1.2 mg/dL Final  . GFR calc non Af Amer 11/07/2016 48* >60 mL/min Final  . GFR calc Af Amer 11/07/2016 56* >60 mL/min Final   Comment: (NOTE) The eGFR has been calculated using the CKD EPI equation. This calculation has not been validated in all clinical situations. eGFR's persistently <60 mL/min signify possible Chronic Kidney Disease.   . Anion gap 11/07/2016 4* 5 - 15 Final  . WBC 11/07/2016 4.0  3.8 - 10.6 K/uL Final  . RBC 11/07/2016 3.58* 4.40 - 5.90 MIL/uL Final  . Hemoglobin 11/07/2016 11.9* 13.0 - 18.0 g/dL Final  . HCT 11/07/2016 34.6* 40.0 - 52.0 % Final  . MCV 11/07/2016 96.7  80.0 - 100.0 fL Final  . MCH 11/07/2016 33.2  26.0 - 34.0 pg Final  . MCHC 11/07/2016 34.3  32.0 - 36.0 g/dL Final  . RDW 11/07/2016 13.9  11.5 - 14.5 % Final  . Platelets 11/07/2016 186  150 - 440 K/uL Final  . Neutrophils Relative % 11/07/2016 52  % Final  . Neutro Abs 11/07/2016 2.1  1.4 - 6.5 K/uL Final  . Lymphocytes Relative 11/07/2016 28  % Final  . Lymphs Abs 11/07/2016 1.1  1.0 - 3.6 K/uL Final  . Monocytes Relative 11/07/2016 12  % Final  . Monocytes Absolute 11/07/2016 0.5  0.2 - 1.0 K/uL Final  . Eosinophils  Relative 11/07/2016 6  % Final  . Eosinophils Absolute 11/07/2016 0.2  0 - 0.7 K/uL Final  . Basophils Relative 11/07/2016 2  % Final  . Basophils Absolute 11/07/2016 0.1  0 - 0.1 K/uL Final  . PSA 11/07/2016 20.77* 0.00 - 4.00 ng/mL Final   Comment: (NOTE) While PSA levels of <=4.0 ng/ml are reported as reference range, some men with levels below 4.0 ng/ml can have prostate cancer and many men with PSA above 4.0 ng/ml do not have prostate cancer.  Other tests such as free PSA, age specific reference ranges, PSA velocity and PSA doubling time may be helpful especially in men less than 63 years old.  Performed at [facility], [place] [address], [place],  [address]     Assessment:  The patient is a 83 year old African American gentleman with prostate cancer and a rising PSA on Lupron.  Prostate cancer was discovered approximately 20 years secondary to an elevated PSA.  He underwent biopsy at [facility] (no report available). Initial PSA was 774.    Abdomen and pelvic CT on 01/05/2006 revealed an enlarged and heterogeneous prostate with mass effect on the base of the bladder.  There was no adenopathy.  Chest, abdomen, and pelvic CT on 03/15/2016 revealed moderate centrilobular emphysema.  There were mild lucent mottled appearance of the osseous structures (cannot exclude multiple myeloma).  There were scattered pulmonary nodules (4 mm or less in size).  There was a 2.7 cm low-attenuation left thyroid nodule.   Work-up on 03/17/2016 revealed no monoclonal protein.  SPEP revealed no monoclonal protein.  Kappa free light chains were 28.9, lambda free light chains 20.7 with a ratio of 1.40 (normal).  Immunoglobulins were normal (IgG 1391, IgA 282, IgM 29).  24 hour urine revealed no monoclonal protein.  Thyroid ultrasound on 03/22/2016 revealed a solitary 2.9 cm left lower pole nodule.  Thyroid biopsy on 04/14/2016 was benign (Bethesda category II) with abundant foamy  macrophages and colloid. 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    He is on Lupron and Casodex.  He receives Lupron 30 mg every 4 months (last 07/04/2016).    PSA has been followed: 127.6 on 06/30/2014, 168.3 on 12/02/2014, 63.32 on 02/01/2015, 40.31 on 03/03/2015, 29.12 on 04/08/2015, 11.28 on 07/14/2015, 11.02 on 08/17/2015, 10.35 on 11/15/2015, 14.37 on 02/15/2016, 12.46 on 06/06/2016, 13.5 on 06/23/2016, 16.63 on 09/05/2016, and 20.77 on 11/07/2016.  He has a normocytic anemia. He was diagnosed with B12 deficiency on 12/24/2014. B12 was 169 (low).  He began B12 supplimentation on 02/01/2015 (last 10/03/2016).  Folate was 11 on 12/24/2014 and 12 on 06/06/2016.  He began B12 supplimentation on 02/01/2015 (last 05/09/2016).  Normal studies included a CMP, ferritin, iron studies, folate, and TSH. Diet is good. He eats meat 2 times a week. He denies any melena, hematochezia, or hematuria. Last colonoscopy was more than 10 years ago.  Guaiac cards x 3 were negative in 02/2016.  Symptomatically, he has rare hot flashes.  He has chronic shortness of breath with exertion.  Exam is stable.  Plan:   1.  Labs today:  CBC with diff, CMP, PSA. 2.  B12 today and monthly. 3.  Continue Casodex. 4.  Lupron today. 5.  Discuss rising PSA and prior plan for restaging studies.  Review plan for bone scan and CT scans. 6.  Bone scan in 2 months 7.  Abdomen/pelvic CT scan in 2 months 8.  RTC after imaging for MD assessment, labs (CBC with diff, CMP, PSA), B12, and review of scans  Addendum:  Patient will be contacted about moving up restaging studies given continued increase in PSA.   Lequita Asal, MD  11/07/2016

## 2016-11-26 ENCOUNTER — Encounter: Payer: Self-pay | Admitting: Hematology and Oncology

## 2016-12-07 ENCOUNTER — Encounter
Admission: RE | Admit: 2016-12-07 | Discharge: 2016-12-07 | Disposition: A | Discharge status: Home / Self Care | Payer: Medicare Other | Source: Ambulatory Visit | Attending: Hematology and Oncology | Admitting: Hematology and Oncology

## 2016-12-07 ENCOUNTER — Ambulatory Visit
Admission: RE | Admit: 2016-12-07 | Discharge: 2016-12-07 | Disposition: A | Discharge status: Home / Self Care | Payer: Medicare Other | Source: Ambulatory Visit | Attending: Hematology and Oncology | Admitting: Hematology and Oncology

## 2016-12-07 DIAGNOSIS — C61 Malignant neoplasm of prostate: Secondary | ICD-10-CM

## 2016-12-07 DIAGNOSIS — K5710 Diverticulosis of small intestine without perforation or abscess without bleeding: Secondary | ICD-10-CM | POA: Insufficient documentation

## 2016-12-07 DIAGNOSIS — D519 Vitamin B12 deficiency anemia, unspecified: Secondary | ICD-10-CM

## 2016-12-07 DIAGNOSIS — I700 Atherosclerosis of aorta: Secondary | ICD-10-CM | POA: Diagnosis not present

## 2016-12-07 MED ORDER — TECHNETIUM TC 99M MEDRONATE IV KIT
25.0000 | PACK | Freq: Once | INTRAVENOUS | Status: AC | PRN
  Administered 2016-12-07: 23.97 via INTRAVENOUS

## 2016-12-08 ENCOUNTER — Ambulatory Visit: Payer: Self-pay

## 2016-12-10 NOTE — Progress Notes (Signed)
[facility] day:  12/11/2016   Chief Complaint: Charles Flores is an 83 y.o. male with prostate cancer and B12 deficiency who is seen for review of imaging and discussion regarding direction of therapy.  HPI: The patient was last seen in the medical oncology clinic on 11/07/2016.  At that time, he was doing well on Casodex.  He had "once in awhile hot flashes".  He denied any bone pain.  He received Lupron.  He received his monthly B12.  We discussed restaging studies.  Scans were moved up after his appointment secondary to his rising PSA.  Abdomen and pelvic CT scan on 12/07/2016 revealed no new or progressive findings.  There was no evidence of lymphadenopathy in the abdomen or pelvis. There was a stable appearance of heterogeneous/mottled bony mineralization.  Bone scan on 12/07/2016 revealed no evidence of osseous metastatic disease.  Symptomatically, he feels pretty good.  He denies any bone pain.  He has shortness of breath on exertion.   Past Medical History:  Diagnosis Date  . Anemia 12/24/2014  . Arthritis   . B12 deficiency anemia 01/01/2015  . Cataract   . Coronary artery disease   . GERD (gastroesophageal reflux disease)   . Hyperlipidemia   . Hyperlipidemia   . Prostate cancer ([place])   . Thyroid nodule 03/14/2016    Past Surgical History:  Procedure Laterality Date  . PROSTATE BIOPSY      Family History  Problem Relation Age of Onset  . Cancer Mother   . Breast cancer Mother   . Kidney disease Father   . Diabetes Sister   . Stroke Sister   . Gastric cancer Sister     Social History:  reports that he has quit smoking. His smoking use included Cigarettes. He has a 7.50 pack-year smoking history. He has never used smokeless tobacco. He reports that he does not drink alcohol or use drugs.  He lives in [place].  He is alone today.  Allergies: No Known Allergies  Current Outpatient Prescriptions  Medication Sig Dispense  Refill  . bicalutamide (CASODEX) 50 MG tablet TAKE 1 TABLET (50 MG TOTAL) BY MOUTH DAILY. 90 tablet 0  . polyethylene glycol (MIRALAX / GLYCOLAX) packet Take 3,350 packets by mouth continuous as needed.  5   No current facility-administered medications for this visit.    ROS  GENERAL:  Feels "pretty good".  No fevers.  Weight up 3 pounds.  PERFORMANCE STATUS (ECOG):  1 HEENT:  No visual changes, runny nose, sore throat, mouth sores or tenderness. Lungs: Shortness of breath with exertion (stable).  No cough.  No hemoptysis. Cardiac:  No chest pain, palpitations, orthopnea, or PND. GI:  No nausea, vomiting, diarrhea, constipation, melena or hematochezia. GU:  No urgency, frequency, dysuria, or hematuria. Musculoskeletal:  No back pain.  No joint pain.  No muscle tenderness. Extremities:  No pain or swelling. Skin:  No rashes or skin changes. Neuro:  No headache, numbness or weakness.  Sometimes balance or coordination issues. Endocrine:  No diabetes, thyroid issues.  Rare hot flashes. Psych:  No mood changes, depression or anxiety. Pain:  No focal pain. Review of systems:  All other systems reviewed and found to be negative.  Physical Exam Blood pressure 123/72, pulse 78, temperature (!) 95.5 F (35.3 C), temperature source Tympanic, resp. rate 18, weight 159 lb 4 oz (72.2 kg).  GENERAL:  Well developed, well nourished gentleman sitting comfortably in the exam room in no  acute distress. MENTAL STATUS:  Alert and oriented to person, place and time. HEAD:  Alopecia.  Slight gray mustache.  Normocephalic, atraumatic, face symmetric, no Cushingoid features. EYES:  Brown eyes (prominent) with slight lid lag.  Pupils equal round and reactive to light and accomodation.  No conjunctivitis or scleral icterus. ENT:  Oropharynx clear without lesion. Dentures.  Tongue normal. Mucous membranes moist. RESPIRATORY:  Clear to auscultation without rales, wheezes or rhonchi. CARDIOVASCULAR:  Regular rate  and rhythm without murmur, rub or gallop. ABDOMEN:  Soft, non-tender, with active bowel sounds, and no hepatosplenomegaly.  No masses. SKIN:  No rashes, ulcers or lesions. EXTREMITIES: No edema, skin discoloration or tenderness.  No palpable cords. LYMPH NODES: No palpable cervical, supraclavicular, axillary or inguinal adenopathy  NEUROLOGICAL: Unremarkable. PSYCH:  Appropriate   Appointment on 12/11/2016  Component Date Value Ref Range Status  . Sodium 12/11/2016 137  135 - 145 mmol/L Final  . Potassium 12/11/2016 4.2  3.5 - 5.1 mmol/L Final  . Chloride 12/11/2016 106  101 - 111 mmol/L Final  . CO2 12/11/2016 24  22 - 32 mmol/L Final  . Glucose, Bld 12/11/2016 98  65 - 99 mg/dL Final  . BUN 12/11/2016 18  6 - 20 mg/dL Final  . Creatinine, Ser 12/11/2016 1.37* 0.61 - 1.24 mg/dL Final  . Calcium 12/11/2016 9.1  8.9 - 10.3 mg/dL Final  . Total Protein 12/11/2016 7.3  6.5 - 8.1 g/dL Final  . Albumin 12/11/2016 4.2  3.5 - 5.0 g/dL Final  . AST 12/11/2016 17  15 - 41 U/L Final  . ALT 12/11/2016 9* 17 - 63 U/L Final  . Alkaline Phosphatase 12/11/2016 70  38 - 126 U/L Final  . Total Bilirubin 12/11/2016 0.8  0.3 - 1.2 mg/dL Final  . GFR calc non Af Amer 12/11/2016 45* >60 mL/min Final  . GFR calc Af Amer 12/11/2016 52* >60 mL/min Final   Comment: (NOTE) The eGFR has been calculated using the CKD EPI equation. This calculation has not been validated in all clinical situations. eGFR's persistently <60 mL/min signify possible Chronic Kidney Disease.   . Anion gap 12/11/2016 7  5 - 15 Final  . WBC 12/11/2016 4.3  3.8 - 10.6 K/uL Final  . RBC 12/11/2016 3.48* 4.40 - 5.90 MIL/uL Final  . Hemoglobin 12/11/2016 11.8* 13.0 - 18.0 g/dL Final  . HCT 12/11/2016 33.9* 40.0 - 52.0 % Final  . MCV 12/11/2016 97.3  80.0 - 100.0 fL Final  . MCH 12/11/2016 33.8  26.0 - 34.0 pg Final  . MCHC 12/11/2016 34.7  32.0 - 36.0 g/dL Final  . RDW 12/11/2016 14.0  11.5 - 14.5 % Final  . Platelets 12/11/2016  184  150 - 440 K/uL Final  . Neutrophils Relative % 12/11/2016 60  % Final  . Neutro Abs 12/11/2016 2.6  1.4 - 6.5 K/uL Final  . Lymphocytes Relative 12/11/2016 24  % Final  . Lymphs Abs 12/11/2016 1.0  1.0 - 3.6 K/uL Final  . Monocytes Relative 12/11/2016 12  % Final  . Monocytes Absolute 12/11/2016 0.5  0.2 - 1.0 K/uL Final  . Eosinophils Relative 12/11/2016 3  % Final  . Eosinophils Absolute 12/11/2016 0.1  0 - 0.7 K/uL Final  . Basophils Relative 12/11/2016 1  % Final  . Basophils Absolute 12/11/2016 0.0  0 - 0.1 K/uL Final    Assessment:  The patient is a 83 year old African American gentleman with prostate cancer and a rising PSA on Lupron.  Prostate cancer was discovered approximately 20 years secondary to an elevated PSA.  He underwent biopsy at [facility] (no report available). Initial PSA was 774.    Abdomen and pelvic CT on 01/05/2006 revealed an enlarged and heterogeneous prostate with mass effect on the base of the bladder.  There was no adenopathy.  Chest, abdomen, and pelvic CT on 03/15/2016 revealed moderate centrilobular emphysema.  There were mild lucent mottled appearance of the osseous structures (cannot exclude multiple myeloma).  There were scattered pulmonary nodules (4 mm or less in size).  There was a 2.7 cm low-attenuation left thyroid nodule.     Abdomen and pelvic CT on 12/07/2016 revealed no new or progressive findings.  There was no evidence of lymphadenopathy in the abdomen or pelvis. There was a stable appearance of heterogeneous/mottled bony mineralization.  Work-up on 03/17/2016 revealed no monoclonal protein.  SPEP revealed no monoclonal protein.  Kappa free light chains were 28.9, lambda free light chains 20.7 with a ratio of 1.40 (normal).  Immunoglobulins were normal (IgG 1391, IgA 282, IgM 29).  24 hour urine revealed no monoclonal protein.  Thyroid ultrasound on 03/22/2016 revealed a solitary 2.9 cm left lower pole nodule.  Thyroid  biopsy on 04/14/2016 was benign (Bethesda category II) with abundant foamy macrophages and colloid. 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    He is on Lupron and Casodex.  He receives Lupron 30 mg every 4 months (last 11/07/2016).    PSA has been followed: 127.6 on 06/30/2014, 168.3 on 12/02/2014, 63.32 on 02/01/2015, 40.31 on 03/03/2015, 29.12 on 04/08/2015, 11.28 on 07/14/2015, 11.02 on 08/17/2015, 10.35 on 11/15/2015, 14.37 on 02/15/2016, 12.46 on 06/06/2016, 13.5 on 06/23/2016, 16.63 on 09/05/2016, 20.77 on 11/07/2016, and 16.41 on 12/11/2016.  He has a normocytic anemia. He was diagnosed with B12 deficiency on 12/24/2014. B12 was 169 (low).  He began B12 supplimentation on 02/01/2015 (last 11/07/2016).  Folate was 11 on 12/24/2014 and 12 on 06/06/2016.  Normal studies included a CMP, ferritin, iron studies, folate, and TSH.   Diet is good. He eats meat 2 times a week. He denies any melena, hematochezia, or hematuria. Last colonoscopy was more than 10 years ago.  Guaiac cards x 3 were negative in 02/2016.  Symptomatically, he feels good.  He has chronic shortness of breath with exertion.  He denies any pain.  Exam is stable.  Plan:   1.  Review CT scan and bone scan.  Review with radiology.  No adenopathy.  Bones are mottled. 2.  CXR (PA and lateral) 3.  Continue Casodex. 4.  B12 today and monthly x 2 5.  RTC on 02/27/2017 for MD assessment, labs (CBC with diff, CMP, PSA, testosterone), and Lupron.   Lequita Asal, MD  12/11/2016, 10:37 AM

## 2016-12-11 ENCOUNTER — Inpatient Hospital Stay: Payer: Medicare Other | Attending: Hematology and Oncology

## 2016-12-11 ENCOUNTER — Encounter: Payer: Self-pay | Admitting: Hematology and Oncology

## 2016-12-11 ENCOUNTER — Inpatient Hospital Stay: Payer: Medicare Other

## 2016-12-11 ENCOUNTER — Inpatient Hospital Stay (HOSPITAL_BASED_OUTPATIENT_CLINIC_OR_DEPARTMENT_OTHER): Payer: Medicare Other | Admitting: Hematology and Oncology

## 2016-12-11 VITALS — BP 123/72 | HR 78 | Temp 95.5°F | Resp 18 | Wt 159.2 lb

## 2016-12-11 DIAGNOSIS — Z79818 Long term (current) use of other agents affecting estrogen receptors and estrogen levels: Secondary | ICD-10-CM | POA: Insufficient documentation

## 2016-12-11 DIAGNOSIS — Z800 Family history of malignant neoplasm of digestive organs: Secondary | ICD-10-CM | POA: Diagnosis not present

## 2016-12-11 DIAGNOSIS — Z8051 Family history of malignant neoplasm of kidney: Secondary | ICD-10-CM

## 2016-12-11 DIAGNOSIS — Z803 Family history of malignant neoplasm of breast: Secondary | ICD-10-CM | POA: Diagnosis not present

## 2016-12-11 DIAGNOSIS — E785 Hyperlipidemia, unspecified: Secondary | ICD-10-CM

## 2016-12-11 DIAGNOSIS — R0609 Other forms of dyspnea: Secondary | ICD-10-CM

## 2016-12-11 DIAGNOSIS — D519 Vitamin B12 deficiency anemia, unspecified: Secondary | ICD-10-CM

## 2016-12-11 DIAGNOSIS — Z79899 Other long term (current) drug therapy: Secondary | ICD-10-CM

## 2016-12-11 DIAGNOSIS — I2510 Atherosclerotic heart disease of native coronary artery without angina pectoris: Secondary | ICD-10-CM

## 2016-12-11 DIAGNOSIS — Z87891 Personal history of nicotine dependence: Secondary | ICD-10-CM | POA: Diagnosis not present

## 2016-12-11 DIAGNOSIS — K219 Gastro-esophageal reflux disease without esophagitis: Secondary | ICD-10-CM | POA: Diagnosis not present

## 2016-12-11 DIAGNOSIS — C61 Malignant neoplasm of prostate: Secondary | ICD-10-CM | POA: Insufficient documentation

## 2016-12-11 LAB — COMPREHENSIVE METABOLIC PANEL
ALT: 9 U/L — ABNORMAL LOW (ref 17–63)
AST: 17 U/L (ref 15–41)
Albumin: 4.2 g/dL (ref 3.5–5.0)
Alkaline Phosphatase: 70 U/L (ref 38–126)
Anion gap: 7 (ref 5–15)
BUN: 18 mg/dL (ref 6–20)
CO2: 24 mmol/L (ref 22–32)
Calcium: 9.1 mg/dL (ref 8.9–10.3)
Chloride: 106 mmol/L (ref 101–111)
Creatinine, Ser: 1.37 mg/dL — ABNORMAL HIGH (ref 0.61–1.24)
GFR calc Af Amer: 52 mL/min — ABNORMAL LOW (ref >60)
GFR calc non Af Amer: 45 mL/min — ABNORMAL LOW (ref >60)
Glucose, Bld: 98 mg/dL (ref 65–99)
Potassium: 4.2 mmol/L (ref 3.5–5.1)
Sodium: 137 mmol/L (ref 135–145)
Total Bilirubin: 0.8 mg/dL (ref 0.3–1.2)
Total Protein: 7.3 g/dL (ref 6.5–8.1)

## 2016-12-11 LAB — CBC WITH DIFFERENTIAL/PLATELET
Basophils Absolute: 0 10*3/uL (ref 0–0.1)
Basophils Relative: 1 %
Eosinophils Absolute: 0.1 10*3/uL (ref 0–0.7)
Eosinophils Relative: 3 %
HCT: 33.9 % — ABNORMAL LOW (ref 40.0–52.0)
Hemoglobin: 11.8 g/dL — ABNORMAL LOW (ref 13.0–18.0)
Lymphocytes Relative: 24 %
Lymphs Abs: 1 10*3/uL (ref 1.0–3.6)
MCH: 33.8 pg (ref 26.0–34.0)
MCHC: 34.7 g/dL (ref 32.0–36.0)
MCV: 97.3 fL (ref 80.0–100.0)
Monocytes Absolute: 0.5 10*3/uL (ref 0.2–1.0)
Monocytes Relative: 12 %
Neutro Abs: 2.6 10*3/uL (ref 1.4–6.5)
Neutrophils Relative %: 60 %
Platelets: 184 10*3/uL (ref 150–440)
RBC: 3.48 MIL/uL — ABNORMAL LOW (ref 4.40–5.90)
RDW: 14 % (ref 11.5–14.5)
WBC: 4.3 10*3/uL (ref 3.8–10.6)

## 2016-12-11 LAB — PSA: PSA: 16.41 ng/mL — ABNORMAL HIGH (ref 0.00–4.00)

## 2016-12-11 MED ORDER — CYANOCOBALAMIN 1000 MCG/ML IJ SOLN
1000.0000 ug | Freq: Once | INTRAMUSCULAR | Status: AC
  Administered 2016-12-11: 1000 ug via INTRAMUSCULAR
  Filled 2016-12-11: qty 1

## 2016-12-11 NOTE — Progress Notes (Signed)
Patient here for CT and bone scan results.

## 2016-12-17 ENCOUNTER — Other Ambulatory Visit: Payer: Self-pay | Admitting: Hematology and Oncology

## 2016-12-20 ENCOUNTER — Other Ambulatory Visit: Payer: Self-pay

## 2016-12-20 DIAGNOSIS — C61 Malignant neoplasm of prostate: Secondary | ICD-10-CM

## 2016-12-21 ENCOUNTER — Other Ambulatory Visit: Payer: Self-pay

## 2016-12-22 ENCOUNTER — Other Ambulatory Visit: Payer: Medicare Other

## 2016-12-22 DIAGNOSIS — C61 Malignant neoplasm of prostate: Secondary | ICD-10-CM | POA: Diagnosis not present

## 2016-12-23 LAB — PSA: Prostate Specific Ag, Serum: 18.8 ng/mL — ABNORMAL HIGH (ref 0.0–4.0)

## 2016-12-23 LAB — TESTOSTERONE: Testosterone: 5 ng/dL — ABNORMAL LOW (ref 264–916)

## 2016-12-25 ENCOUNTER — Ambulatory Visit (INDEPENDENT_AMBULATORY_CARE_PROVIDER_SITE_OTHER): Payer: Medicare Other | Admitting: Urology

## 2016-12-25 ENCOUNTER — Other Ambulatory Visit: Payer: Self-pay | Admitting: Urology

## 2016-12-25 ENCOUNTER — Encounter: Payer: Self-pay | Admitting: Urology

## 2016-12-25 VITALS — BP 118/70 | HR 81 | Ht 71.0 in | Wt 157.5 lb

## 2016-12-25 DIAGNOSIS — R972 Elevated prostate specific antigen [PSA]: Secondary | ICD-10-CM

## 2016-12-25 NOTE — Progress Notes (Signed)
12/25/2016 11:19 AM   Charles Flores [birth year] [id number]  Referring provider: Christie Nottingham, [place], [place] [address]  Chief Complaint  Patient presents with  . Prostate Cancer    HPI: Patient last assessed by Dr. Louis Meckel. He has a long history of metastatic prostate cancer and his PSA recently was 20.77. It is now 18.8 and a few weeks ago was 16.41'; and in the past it is been as high as 700. He is on Lupron and case index and calcium supplements. His serum testosterone is 5  Voiding well. Frequency reasonable. No bone pain or recent weight loss     PMH: Past Medical History:  Diagnosis Date  . Anemia 12/24/2014  . Arthritis   . B12 deficiency anemia 01/01/2015  . Cataract   . Coronary artery disease   . GERD (gastroesophageal reflux disease)   . Hyperlipidemia   . Hyperlipidemia   . Prostate cancer ([place])   . Thyroid nodule 03/14/2016    Surgical History: Past Surgical History:  Procedure Laterality Date  . PROSTATE BIOPSY      Home Medications:  Allergies as of 12/25/2016   No Known Allergies     Medication List       Accurate as of 12/25/16 11:19 AM. Always use your most recent med list.          bicalutamide 50 MG tablet Commonly known as:  CASODEX TAKE 1 TABLET (50 MG TOTAL) BY MOUTH DAILY.   polyethylene glycol packet Commonly known as:  MIRALAX / GLYCOLAX Take 3,350 packets by mouth continuous as needed.       Allergies: No Known Allergies  Family History: Family History  Problem Relation Age of Onset  . Cancer Mother   . Breast cancer Mother   . Kidney disease Father   . Diabetes Sister   . Stroke Sister   . Gastric cancer Sister     Social History:  reports that he has quit smoking. His smoking use included Cigarettes. He has a 7.50 pack-year smoking history. He has never used smokeless tobacco. He reports that he does not drink alcohol or use drugs.  ROS: UROLOGY Frequent Urination?: No Hard to postpone urination?:  No Burning/pain with urination?: No Get up at night to urinate?: No Leakage of urine?: No Urine stream starts and stops?: No Trouble starting stream?: No Do you have to strain to urinate?: No Blood in urine?: No Urinary tract infection?: No Sexually transmitted disease?: No Injury to kidneys or bladder?: No Painful intercourse?: No Weak stream?: No Erection problems?: No Penile pain?: No  Gastrointestinal Nausea?: No Vomiting?: No Indigestion/heartburn?: No Diarrhea?: No Constipation?: No  Constitutional Fever: No Night sweats?: No Weight loss?: No Fatigue?: No  Skin Skin rash/lesions?: No Itching?: No  Eyes Blurred vision?: No Double vision?: No  Ears/Nose/Throat Sore throat?: No Sinus problems?: No  Hematologic/Lymphatic Swollen glands?: No Easy bruising?: No  Cardiovascular Leg swelling?: No Chest pain?: No  Respiratory Cough?: No Shortness of breath?: No  Endocrine Excessive thirst?: No  Musculoskeletal Back pain?: No Joint pain?: No  Neurological Headaches?: No Dizziness?: No  Psychologic Depression?: No Anxiety?: No  Physical Exam: BP 118/70 (BP Location: Left Arm, Patient Position: Sitting, Cuff Size: Normal)   Pulse 81   Ht 5\' 11"  (1.803 m)   Wt 157 lb 8 oz (71.4 kg)   BMI 21.97 kg/m   Constitutional:  Alert and oriented, No acute distress.   Laboratory Data: Lab Results  Component Value Date  WBC 4.3 12/11/2016   HGB 11.8 (L) 12/11/2016   HCT 33.9 (L) 12/11/2016   MCV 97.3 12/11/2016   PLT 184 12/11/2016    Lab Results  Component Value Date   CREATININE 1.37 (H) 12/11/2016    Lab Results  Component Value Date   PSA 16.41 (H) 12/11/2016   PSA 20.77 (H) 11/07/2016   PSA 16.63 (H) 09/05/2016    Lab Results  Component Value Date   TESTOSTERONE 5 (L) 12/22/2016    No results found for: HGBA1C  Urinalysis No results found for: COLORURINE, APPEARANCEUR, LABSPEC, PHURINE, GLUCOSEU, HGBUR, BILIRUBINUR,  KETONESUR, PROTEINUR, UROBILINOGEN, NITRITE, LEUKOCYTESUR  Pertinent Imaging: none  Assessment & Plan:  Continue with hormonal therapy and repeat PSA and serum testosterone in 6 months  There are no diagnoses linked to this encounter.  No Follow-up on file.  Reece Packer, MD  [facility] [address], [place], [place] [address] [phone]

## 2016-12-27 ENCOUNTER — Telehealth: Payer: Self-pay

## 2016-12-27 NOTE — Telephone Encounter (Signed)
Spoke with pt in reference to PSA results and f/u visit. Pt voiced understanding.

## 2016-12-27 NOTE — Telephone Encounter (Signed)
Ardis Hughs, MD  Toniann Fail C, LPN        PSa is stable, we will discuss at his next follow-up visit.    No answer.

## 2016-12-30 ENCOUNTER — Encounter: Payer: Self-pay | Admitting: Hematology and Oncology

## 2017-01-05 ENCOUNTER — Other Ambulatory Visit: Payer: Self-pay

## 2017-01-08 ENCOUNTER — Ambulatory Visit: Payer: Self-pay | Admitting: Hematology and Oncology

## 2017-01-08 ENCOUNTER — Other Ambulatory Visit: Payer: Self-pay

## 2017-01-08 ENCOUNTER — Inpatient Hospital Stay: Payer: Medicare Other | Attending: Hematology and Oncology

## 2017-01-08 ENCOUNTER — Ambulatory Visit: Payer: Self-pay

## 2017-01-08 DIAGNOSIS — Z79818 Long term (current) use of other agents affecting estrogen receptors and estrogen levels: Secondary | ICD-10-CM | POA: Diagnosis not present

## 2017-01-08 DIAGNOSIS — C61 Malignant neoplasm of prostate: Secondary | ICD-10-CM | POA: Insufficient documentation

## 2017-01-08 DIAGNOSIS — D519 Vitamin B12 deficiency anemia, unspecified: Secondary | ICD-10-CM

## 2017-01-08 MED ORDER — CYANOCOBALAMIN 1000 MCG/ML IJ SOLN
1000.0000 ug | Freq: Once | INTRAMUSCULAR | Status: AC
  Administered 2017-01-08: 1000 ug via INTRAMUSCULAR
  Filled 2017-01-08: qty 1

## 2017-02-05 ENCOUNTER — Inpatient Hospital Stay: Payer: Medicare Other | Attending: Hematology and Oncology

## 2017-02-05 DIAGNOSIS — Z79818 Long term (current) use of other agents affecting estrogen receptors and estrogen levels: Secondary | ICD-10-CM | POA: Diagnosis not present

## 2017-02-05 DIAGNOSIS — C61 Malignant neoplasm of prostate: Secondary | ICD-10-CM | POA: Diagnosis not present

## 2017-02-05 DIAGNOSIS — D519 Vitamin B12 deficiency anemia, unspecified: Secondary | ICD-10-CM | POA: Insufficient documentation

## 2017-02-05 MED ORDER — CYANOCOBALAMIN 1000 MCG/ML IJ SOLN
1000.0000 ug | Freq: Once | INTRAMUSCULAR | Status: AC
  Administered 2017-02-05: 1000 ug via INTRAMUSCULAR
  Filled 2017-02-05: qty 1

## 2017-02-19 DIAGNOSIS — H40003 Preglaucoma, unspecified, bilateral: Secondary | ICD-10-CM | POA: Diagnosis not present

## 2017-02-27 ENCOUNTER — Inpatient Hospital Stay: Payer: Medicare Other

## 2017-02-27 ENCOUNTER — Inpatient Hospital Stay: Payer: Medicare Other | Attending: Hematology and Oncology | Admitting: Hematology and Oncology

## 2017-02-27 ENCOUNTER — Other Ambulatory Visit: Payer: Self-pay | Admitting: Hematology and Oncology

## 2017-02-27 VITALS — BP 107/70 | HR 82 | Temp 95.5°F | Resp 18 | Wt 148.1 lb

## 2017-02-27 DIAGNOSIS — Z800 Family history of malignant neoplasm of digestive organs: Secondary | ICD-10-CM | POA: Insufficient documentation

## 2017-02-27 DIAGNOSIS — R0609 Other forms of dyspnea: Secondary | ICD-10-CM | POA: Insufficient documentation

## 2017-02-27 DIAGNOSIS — E785 Hyperlipidemia, unspecified: Secondary | ICD-10-CM | POA: Diagnosis not present

## 2017-02-27 DIAGNOSIS — Z803 Family history of malignant neoplasm of breast: Secondary | ICD-10-CM | POA: Insufficient documentation

## 2017-02-27 DIAGNOSIS — C61 Malignant neoplasm of prostate: Secondary | ICD-10-CM

## 2017-02-27 DIAGNOSIS — Z79899 Other long term (current) drug therapy: Secondary | ICD-10-CM | POA: Insufficient documentation

## 2017-02-27 DIAGNOSIS — D519 Vitamin B12 deficiency anemia, unspecified: Secondary | ICD-10-CM | POA: Diagnosis not present

## 2017-02-27 DIAGNOSIS — K219 Gastro-esophageal reflux disease without esophagitis: Secondary | ICD-10-CM

## 2017-02-27 DIAGNOSIS — E041 Nontoxic single thyroid nodule: Secondary | ICD-10-CM

## 2017-02-27 DIAGNOSIS — I2510 Atherosclerotic heart disease of native coronary artery without angina pectoris: Secondary | ICD-10-CM | POA: Insufficient documentation

## 2017-02-27 DIAGNOSIS — Z79818 Long term (current) use of other agents affecting estrogen receptors and estrogen levels: Secondary | ICD-10-CM | POA: Diagnosis not present

## 2017-02-27 DIAGNOSIS — Z87891 Personal history of nicotine dependence: Secondary | ICD-10-CM | POA: Diagnosis not present

## 2017-02-27 DIAGNOSIS — R634 Abnormal weight loss: Secondary | ICD-10-CM

## 2017-02-27 DIAGNOSIS — R918 Other nonspecific abnormal finding of lung field: Secondary | ICD-10-CM | POA: Diagnosis not present

## 2017-02-27 DIAGNOSIS — N400 Enlarged prostate without lower urinary tract symptoms: Secondary | ICD-10-CM | POA: Diagnosis not present

## 2017-02-27 LAB — CBC WITH DIFFERENTIAL/PLATELET
Basophils Absolute: 0.1 10*3/uL (ref 0–0.1)
Basophils Relative: 1 %
Eosinophils Absolute: 0.2 10*3/uL (ref 0–0.7)
Eosinophils Relative: 5 %
HCT: 32.2 % — ABNORMAL LOW (ref 40.0–52.0)
Hemoglobin: 11.3 g/dL — ABNORMAL LOW (ref 13.0–18.0)
Lymphocytes Relative: 21 %
Lymphs Abs: 0.9 10*3/uL — ABNORMAL LOW (ref 1.0–3.6)
MCH: 33.2 pg (ref 26.0–34.0)
MCHC: 35.1 g/dL (ref 32.0–36.0)
MCV: 94.7 fL (ref 80.0–100.0)
Monocytes Absolute: 0.6 10*3/uL (ref 0.2–1.0)
Monocytes Relative: 13 %
Neutro Abs: 2.7 10*3/uL (ref 1.4–6.5)
Neutrophils Relative %: 60 %
Platelets: 223 10*3/uL (ref 150–440)
RBC: 3.4 MIL/uL — ABNORMAL LOW (ref 4.40–5.90)
RDW: 13.7 % (ref 11.5–14.5)
WBC: 4.5 10*3/uL (ref 3.8–10.6)

## 2017-02-27 LAB — COMPREHENSIVE METABOLIC PANEL
ALT: 8 U/L — ABNORMAL LOW (ref 17–63)
AST: 14 U/L — ABNORMAL LOW (ref 15–41)
Albumin: 3.9 g/dL (ref 3.5–5.0)
Alkaline Phosphatase: 72 U/L (ref 38–126)
Anion gap: 9 (ref 5–15)
BUN: 27 mg/dL — ABNORMAL HIGH (ref 6–20)
CO2: 24 mmol/L (ref 22–32)
Calcium: 9.2 mg/dL (ref 8.9–10.3)
Chloride: 106 mmol/L (ref 101–111)
Creatinine, Ser: 1.55 mg/dL — ABNORMAL HIGH (ref 0.61–1.24)
GFR calc Af Amer: 44 mL/min — ABNORMAL LOW (ref >60)
GFR calc non Af Amer: 38 mL/min — ABNORMAL LOW (ref >60)
Glucose, Bld: 92 mg/dL (ref 65–99)
Potassium: 3.9 mmol/L (ref 3.5–5.1)
Sodium: 139 mmol/L (ref 135–145)
Total Bilirubin: 0.5 mg/dL (ref 0.3–1.2)
Total Protein: 7.2 g/dL (ref 6.5–8.1)

## 2017-02-27 LAB — PSA: Prostatic Specific Antigen: 26.69 ng/mL — ABNORMAL HIGH (ref 0.00–4.00)

## 2017-02-27 MED ORDER — LEUPROLIDE ACETATE (4 MONTH) 30 MG IM KIT
30.0000 mg | PACK | Freq: Once | INTRAMUSCULAR | Status: AC
  Administered 2017-02-27: 30 mg via INTRAMUSCULAR

## 2017-02-27 NOTE — Progress Notes (Signed)
[place] [facility] day:  02/27/17   Chief Complaint: Charles Flores is an 83 y.o. male with prostate cancer and B12 deficiency who is seen for Lupron injection, labs and routine follow-up.  HPI: The patient was last seen in the medical oncology clinic on 12/11/2016.  At that time, he felt good.  He had chronic shortness of breath with exertion.  He denied any pain.  Exam was stable.  Abdomen and pelvic CT scan was stable.  Bone scan revealed no evidence of metastatic disease.  PSA was 16.41.  He continued Casodex.    He received monthly B12 (01/08/2017 and 02/05/2017).  Symptomatically, he feels well. He has no concerns today. He denies hot flashes. He states that he eats less during the summertime because he is "not hungry" but continues to eat 2 meals a day. He drinks plenty of fluids. He does not drink supplements.     Past Medical History:  Diagnosis Date  . Anemia 12/24/2014  . Arthritis   . B12 deficiency anemia 01/01/2015  . Cataract   . Coronary artery disease   . GERD (gastroesophageal reflux disease)   . Hyperlipidemia   . Hyperlipidemia   . Prostate cancer ([place])   . Thyroid nodule 03/14/2016    Past Surgical History:  Procedure Laterality Date  . PROSTATE BIOPSY      Family History  Problem Relation Age of Onset  . Cancer Mother   . Breast cancer Mother   . Kidney disease Father   . Diabetes Sister   . Stroke Sister   . Gastric cancer Sister     Social History:  reports that he has quit smoking. His smoking use included Cigarettes. He has a 7.50 pack-year smoking history. He has never used smokeless tobacco. He reports that he does not drink alcohol or use drugs.  He lives in [place].  He is alone today.  Allergies: No Known Allergies  Current Outpatient Prescriptions  Medication Sig Dispense Refill  . bicalutamide (CASODEX) 50 MG tablet TAKE 1 TABLET (50 MG TOTAL) BY MOUTH DAILY. 90 tablet 0  . polyethylene glycol (MIRALAX  / GLYCOLAX) packet Take 3,350 packets by mouth continuous as needed.  5   No current facility-administered medications for this visit.    ROS  GENERAL:  Feels "pretty good".  No fevers.  Weight down 9 pounds.  PERFORMANCE STATUS (ECOG):  1 HEENT:  No visual changes, runny nose, sore throat, mouth sores or tenderness. Lungs: Shortness of breath with exertion (stable).  No cough.  No hemoptysis. Cardiac:  No chest pain, palpitations, orthopnea, or PND. GI:  No nausea, vomiting, diarrhea, constipation, melena or hematochezia. GU:  No urgency, frequency, dysuria, or hematuria. Musculoskeletal:  No back pain.  No joint pain.  No muscle tenderness. Extremities:  No pain or swelling. Skin:  No rashes or skin changes. Neuro:  No headache, numbness or weakness.  Sometimes balance or coordination issues. Endocrine:  No diabetes, thyroid issues.  Rare hot flashes. Psych:  No mood changes, depression or anxiety. Pain:  No focal pain. Review of systems:  All other systems reviewed and found to be negative.  Physical Exam Blood pressure 107/70, pulse 82, temperature (!) 95.5 F (35.3 C), temperature source Tympanic, resp. rate 18, weight 148 lb 1 oz (67.2 kg).  GENERAL:  Well developed, well nourished gentleman sitting comfortably in the exam room in no acute distress. MENTAL STATUS:  Alert and oriented to person, place and time. HEAD:  Alopecia.  Slight gray mustache.  Normocephalic, atraumatic, face symmetric, no Cushingoid features. EYES:  Brown eyes (prominent) with slight lid lag.  Pupils equal round and reactive to light and accomodation.  No conjunctivitis or scleral icterus. ENT:  Oropharynx clear without lesion. Dentures.  Tongue normal. Mucous membranes moist. RESPIRATORY:  Clear to auscultation without rales, wheezes or rhonchi. CARDIOVASCULAR:  Regular rate and rhythm without murmur, rub or gallop. ABDOMEN:  Soft, non-tender, with active bowel sounds, and no hepatosplenomegaly.  No  masses. SKIN:  No rashes, ulcers or lesions. EXTREMITIES: No edema, skin discoloration or tenderness.  No palpable cords. LYMPH NODES: No palpable cervical, supraclavicular, axillary or inguinal adenopathy  NEUROLOGICAL: Unremarkable. PSYCH:  Appropriate   Appointment on 02/27/2017  Component Date Value Ref Range Status  . Sodium 02/27/2017 139  135 - 145 mmol/L Final  . Potassium 02/27/2017 3.9  3.5 - 5.1 mmol/L Final  . Chloride 02/27/2017 106  101 - 111 mmol/L Final  . CO2 02/27/2017 24  22 - 32 mmol/L Final  . Glucose, Bld 02/27/2017 92  65 - 99 mg/dL Final  . BUN 02/27/2017 27* 6 - 20 mg/dL Final  . Creatinine, Ser 02/27/2017 1.55* 0.61 - 1.24 mg/dL Final  . Calcium 02/27/2017 9.2  8.9 - 10.3 mg/dL Final  . Total Protein 02/27/2017 7.2  6.5 - 8.1 g/dL Final  . Albumin 02/27/2017 3.9  3.5 - 5.0 g/dL Final  . AST 02/27/2017 14* 15 - 41 U/L Final  . ALT 02/27/2017 8* 17 - 63 U/L Final  . Alkaline Phosphatase 02/27/2017 72  38 - 126 U/L Final  . Total Bilirubin 02/27/2017 0.5  0.3 - 1.2 mg/dL Final  . GFR calc non Af Amer 02/27/2017 38* >60 mL/min Final  . GFR calc Af Amer 02/27/2017 44* >60 mL/min Final   Comment: (NOTE) The eGFR has been calculated using the CKD EPI equation. This calculation has not been validated in all clinical situations. eGFR's persistently <60 mL/min signify possible Chronic Kidney Disease.   . Anion gap 02/27/2017 9  5 - 15 Final  . WBC 02/27/2017 4.5  3.8 - 10.6 K/uL Final  . RBC 02/27/2017 3.40* 4.40 - 5.90 MIL/uL Final  . Hemoglobin 02/27/2017 11.3* 13.0 - 18.0 g/dL Final  . HCT 02/27/2017 32.2* 40.0 - 52.0 % Final  . MCV 02/27/2017 94.7  80.0 - 100.0 fL Final  . MCH 02/27/2017 33.2  26.0 - 34.0 pg Final  . MCHC 02/27/2017 35.1  32.0 - 36.0 g/dL Final  . RDW 02/27/2017 13.7  11.5 - 14.5 % Final  . Platelets 02/27/2017 223  150 - 440 K/uL Final  . Neutrophils Relative % 02/27/2017 60  % Final  . Neutro Abs 02/27/2017 2.7  1.4 - 6.5 K/uL Final   . Lymphocytes Relative 02/27/2017 21  % Final  . Lymphs Abs 02/27/2017 0.9* 1.0 - 3.6 K/uL Final  . Monocytes Relative 02/27/2017 13  % Final  . Monocytes Absolute 02/27/2017 0.6  0.2 - 1.0 K/uL Final  . Eosinophils Relative 02/27/2017 5  % Final  . Eosinophils Absolute 02/27/2017 0.2  0 - 0.7 K/uL Final  . Basophils Relative 02/27/2017 1  % Final  . Basophils Absolute 02/27/2017 0.1  0 - 0.1 K/uL Final  . Prostatic Specific Antigen 02/27/2017 26.69* 0.00 - 4.00 ng/mL Final   Comment: (NOTE) While PSA levels of <=4.0 ng/ml are reported as reference range, some men with levels below 4.0 ng/ml can have prostate cancer and many men with  PSA above 4.0 ng/ml do not have prostate cancer.  Other tests such as free PSA, age specific reference ranges, PSA velocity and PSA doubling time may be helpful especially in men less than 28 years old. Performed at [facility], [place] [address], [place], [place] [address]   . Testosterone 02/27/2017 5* 264 - 916 ng/dL Final   Comment: (NOTE) Adult male reference interval is based on a population of healthy nonobese males (BMI <30) between 45 and 73 years old. [place], [place] [phone]. PMID: [id number]. Performed At: [facility] [place], [place] [id number] Lindon Romp MD OV:[id number]     Assessment:  The patient is a 83 year old African American gentleman with prostate cancer and a rising PSA on Lupron.  Prostate cancer was discovered approximately 20 years secondary to an elevated PSA.  He underwent biopsy at [facility] (no report available). Initial PSA was 774.    Abdomen and pelvic CT on 01/05/2006 revealed an enlarged and heterogeneous prostate with mass effect on the base of the bladder.  There was no adenopathy.  Chest, abdomen, and pelvic CT on 03/15/2016 revealed moderate centrilobular emphysema.  There were mild lucent mottled appearance of the osseous structures (cannot  exclude multiple myeloma).  There were scattered pulmonary nodules (4 mm or less in size).  There was a 2.7 cm low-attenuation left thyroid nodule.     Abdomen and pelvic CT on 12/07/2016 revealed no new or progressive findings.  There was no evidence of lymphadenopathy in the abdomen or pelvis. There was a stable appearance of heterogeneous/mottled bony mineralization.  Work-up on 03/17/2016 revealed no monoclonal protein.  SPEP revealed no monoclonal protein.  Kappa free light chains were 28.9, lambda free light chains 20.7 with a ratio of 1.40 (normal).  Immunoglobulins were normal (IgG 1391, IgA 282, IgM 29).  24 hour urine revealed no monoclonal protein.  Thyroid ultrasound on 03/22/2016 revealed a solitary 2.9 cm left lower pole nodule.  Thyroid biopsy on 04/14/2016 was benign (Bethesda category II) with abundant foamy macrophages and colloid. 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   He is on Lupron and Casodex.  He receives Lupron 30 mg every 4 months (last 11/07/2016).    PSA has been followed: 127.6 on 06/30/2014, 168.3 on 12/02/2014, 63.32 on 02/01/2015, 40.31 on 03/03/2015, 29.12 on 04/08/2015, 11.28 on 07/14/2015, 11.02 on 08/17/2015, 10.35 on 11/15/2015, 14.37 on 02/15/2016, 12.46 on 06/06/2016, 13.5 on 06/23/2016, 16.63 on 09/05/2016, 20.77 on 11/07/2016, and 16.41 on 12/11/2016.  He has a normocytic anemia. He was diagnosed with B12 deficiency on 12/24/2014. B12 was 169 (low).  He began B12 supplimentation on 02/01/2015 (last  02/05/2017).  Folate was 11 on 12/24/2014 and 12 on 06/06/2016.  Normal studies included a CMP, ferritin, iron studies, folate, and TSH.   Diet is good. He eats meat 2 times a week. He denies any melena, hematochezia, or hematuria. Last colonoscopy was more than 10 years ago.  Guaiac cards x 3 were negative in  02/2016.  Symptomatically, he feels good.  He has chronic shortness of breath.  He denies any pain.  Exam is stable.   Plan:   1.  Labs today:  CBC with diff, CMP, PSA, testosterone. 2.  Continue Casodex. 3.  Lupron 30 mg IM today. 4.  Continue monthly B12 (due 07/16, 08/13, 09/10, 10/08). Scheduled today.  5.  Weight loss: 9 lbs. Discussed high caloric foods. Discussed supplementation with Ensures and Breeze if he misses a meal. He is agreeable. 6.  RTC in 4 months for MD assessment, labs (CBC with diff, CMP, PSA), and Lupron.  Jacquelin Hawking, NP  03/01/2017, 8:57 AM

## 2017-02-27 NOTE — Progress Notes (Signed)
Patient offers no complaints today. 

## 2017-02-28 DIAGNOSIS — H2513 Age-related nuclear cataract, bilateral: Secondary | ICD-10-CM | POA: Diagnosis not present

## 2017-02-28 LAB — TESTOSTERONE: Testosterone: 5 ng/dL — ABNORMAL LOW (ref 264–916)

## 2017-03-04 ENCOUNTER — Encounter: Payer: Self-pay | Admitting: Hematology and Oncology

## 2017-03-05 ENCOUNTER — Inpatient Hospital Stay: Payer: Medicare Other

## 2017-03-05 DIAGNOSIS — K219 Gastro-esophageal reflux disease without esophagitis: Secondary | ICD-10-CM | POA: Diagnosis not present

## 2017-03-05 DIAGNOSIS — N400 Enlarged prostate without lower urinary tract symptoms: Secondary | ICD-10-CM | POA: Diagnosis not present

## 2017-03-05 DIAGNOSIS — R918 Other nonspecific abnormal finding of lung field: Secondary | ICD-10-CM | POA: Diagnosis not present

## 2017-03-05 DIAGNOSIS — I2510 Atherosclerotic heart disease of native coronary artery without angina pectoris: Secondary | ICD-10-CM | POA: Diagnosis not present

## 2017-03-05 DIAGNOSIS — Z79818 Long term (current) use of other agents affecting estrogen receptors and estrogen levels: Secondary | ICD-10-CM | POA: Diagnosis not present

## 2017-03-05 DIAGNOSIS — D519 Vitamin B12 deficiency anemia, unspecified: Secondary | ICD-10-CM | POA: Diagnosis not present

## 2017-03-05 DIAGNOSIS — E041 Nontoxic single thyroid nodule: Secondary | ICD-10-CM | POA: Diagnosis not present

## 2017-03-05 DIAGNOSIS — Z800 Family history of malignant neoplasm of digestive organs: Secondary | ICD-10-CM | POA: Diagnosis not present

## 2017-03-05 DIAGNOSIS — E785 Hyperlipidemia, unspecified: Secondary | ICD-10-CM | POA: Diagnosis not present

## 2017-03-05 DIAGNOSIS — R0609 Other forms of dyspnea: Secondary | ICD-10-CM | POA: Diagnosis not present

## 2017-03-05 DIAGNOSIS — Z87891 Personal history of nicotine dependence: Secondary | ICD-10-CM | POA: Diagnosis not present

## 2017-03-05 DIAGNOSIS — Z803 Family history of malignant neoplasm of breast: Secondary | ICD-10-CM | POA: Diagnosis not present

## 2017-03-05 DIAGNOSIS — C61 Malignant neoplasm of prostate: Secondary | ICD-10-CM | POA: Diagnosis not present

## 2017-03-05 DIAGNOSIS — Z79899 Other long term (current) drug therapy: Secondary | ICD-10-CM | POA: Diagnosis not present

## 2017-03-05 MED ORDER — CYANOCOBALAMIN 1000 MCG/ML IJ SOLN
1000.0000 ug | Freq: Once | INTRAMUSCULAR | Status: AC
Start: 2017-03-05 — End: 2017-03-05
  Administered 2017-03-05: 1000 ug via INTRAMUSCULAR

## 2017-03-06 DIAGNOSIS — D519 Vitamin B12 deficiency anemia, unspecified: Secondary | ICD-10-CM | POA: Diagnosis not present

## 2017-03-06 DIAGNOSIS — C61 Malignant neoplasm of prostate: Secondary | ICD-10-CM | POA: Diagnosis not present

## 2017-03-06 DIAGNOSIS — Z0001 Encounter for general adult medical examination with abnormal findings: Secondary | ICD-10-CM | POA: Diagnosis not present

## 2017-03-06 DIAGNOSIS — R0602 Shortness of breath: Secondary | ICD-10-CM | POA: Diagnosis not present

## 2017-03-18 ENCOUNTER — Other Ambulatory Visit: Payer: Self-pay | Admitting: Hematology and Oncology

## 2017-03-19 ENCOUNTER — Other Ambulatory Visit: Payer: Self-pay | Admitting: *Deleted

## 2017-03-19 DIAGNOSIS — C61 Malignant neoplasm of prostate: Secondary | ICD-10-CM

## 2017-04-02 ENCOUNTER — Encounter: Payer: Self-pay | Admitting: Hematology and Oncology

## 2017-04-02 ENCOUNTER — Inpatient Hospital Stay: Payer: Medicare Other | Attending: Hematology and Oncology

## 2017-04-02 ENCOUNTER — Inpatient Hospital Stay: Payer: Medicare Other

## 2017-04-02 ENCOUNTER — Inpatient Hospital Stay (HOSPITAL_BASED_OUTPATIENT_CLINIC_OR_DEPARTMENT_OTHER): Payer: Medicare Other | Admitting: Hematology and Oncology

## 2017-04-02 VITALS — BP 103/69 | HR 76 | Temp 95.7°F | Wt 145.1 lb

## 2017-04-02 DIAGNOSIS — R918 Other nonspecific abnormal finding of lung field: Secondary | ICD-10-CM | POA: Insufficient documentation

## 2017-04-02 DIAGNOSIS — Z79818 Long term (current) use of other agents affecting estrogen receptors and estrogen levels: Secondary | ICD-10-CM | POA: Insufficient documentation

## 2017-04-02 DIAGNOSIS — R634 Abnormal weight loss: Secondary | ICD-10-CM

## 2017-04-02 DIAGNOSIS — Z79899 Other long term (current) drug therapy: Secondary | ICD-10-CM | POA: Diagnosis not present

## 2017-04-02 DIAGNOSIS — K219 Gastro-esophageal reflux disease without esophagitis: Secondary | ICD-10-CM | POA: Diagnosis not present

## 2017-04-02 DIAGNOSIS — C61 Malignant neoplasm of prostate: Secondary | ICD-10-CM | POA: Diagnosis not present

## 2017-04-02 DIAGNOSIS — E785 Hyperlipidemia, unspecified: Secondary | ICD-10-CM

## 2017-04-02 DIAGNOSIS — D519 Vitamin B12 deficiency anemia, unspecified: Secondary | ICD-10-CM | POA: Insufficient documentation

## 2017-04-02 DIAGNOSIS — Z803 Family history of malignant neoplasm of breast: Secondary | ICD-10-CM

## 2017-04-02 DIAGNOSIS — Z800 Family history of malignant neoplasm of digestive organs: Secondary | ICD-10-CM | POA: Diagnosis not present

## 2017-04-02 DIAGNOSIS — I2510 Atherosclerotic heart disease of native coronary artery without angina pectoris: Secondary | ICD-10-CM | POA: Diagnosis not present

## 2017-04-02 DIAGNOSIS — E041 Nontoxic single thyroid nodule: Secondary | ICD-10-CM

## 2017-04-02 DIAGNOSIS — Z8546 Personal history of malignant neoplasm of prostate: Secondary | ICD-10-CM

## 2017-04-02 DIAGNOSIS — N289 Disorder of kidney and ureter, unspecified: Secondary | ICD-10-CM

## 2017-04-02 DIAGNOSIS — Z87891 Personal history of nicotine dependence: Secondary | ICD-10-CM | POA: Insufficient documentation

## 2017-04-02 DIAGNOSIS — R9721 Rising PSA following treatment for malignant neoplasm of prostate: Secondary | ICD-10-CM | POA: Diagnosis not present

## 2017-04-02 LAB — CBC WITH DIFFERENTIAL/PLATELET
Basophils Absolute: 0 10*3/uL (ref 0–0.1)
Basophils Relative: 1 %
Eosinophils Absolute: 0.1 10*3/uL (ref 0–0.7)
Eosinophils Relative: 3 %
HCT: 30.3 % — ABNORMAL LOW (ref 40.0–52.0)
Hemoglobin: 10.6 g/dL — ABNORMAL LOW (ref 13.0–18.0)
Lymphocytes Relative: 28 %
Lymphs Abs: 1.1 10*3/uL (ref 1.0–3.6)
MCH: 33.7 pg (ref 26.0–34.0)
MCHC: 35.1 g/dL (ref 32.0–36.0)
MCV: 95.9 fL (ref 80.0–100.0)
Monocytes Absolute: 0.5 10*3/uL (ref 0.2–1.0)
Monocytes Relative: 13 %
Neutro Abs: 2.1 10*3/uL (ref 1.4–6.5)
Neutrophils Relative %: 55 %
Platelets: 199 10*3/uL (ref 150–440)
RBC: 3.16 MIL/uL — ABNORMAL LOW (ref 4.40–5.90)
RDW: 14 % (ref 11.5–14.5)
WBC: 3.8 10*3/uL (ref 3.8–10.6)

## 2017-04-02 LAB — COMPREHENSIVE METABOLIC PANEL
ALT: 7 U/L — ABNORMAL LOW (ref 17–63)
AST: 15 U/L (ref 15–41)
Albumin: 3.9 g/dL (ref 3.5–5.0)
Alkaline Phosphatase: 60 U/L (ref 38–126)
Anion gap: 9 (ref 5–15)
BUN: 29 mg/dL — ABNORMAL HIGH (ref 6–20)
CO2: 23 mmol/L (ref 22–32)
Calcium: 9.2 mg/dL (ref 8.9–10.3)
Chloride: 105 mmol/L (ref 101–111)
Creatinine, Ser: 1.64 mg/dL — ABNORMAL HIGH (ref 0.61–1.24)
GFR calc Af Amer: 41 mL/min — ABNORMAL LOW (ref >60)
GFR calc non Af Amer: 36 mL/min — ABNORMAL LOW (ref >60)
Glucose, Bld: 90 mg/dL (ref 65–99)
Potassium: 4.1 mmol/L (ref 3.5–5.1)
Sodium: 137 mmol/L (ref 135–145)
Total Bilirubin: 0.7 mg/dL (ref 0.3–1.2)
Total Protein: 6.8 g/dL (ref 6.5–8.1)

## 2017-04-02 LAB — PSA: Prostatic Specific Antigen: 25.67 ng/mL — ABNORMAL HIGH (ref 0.00–4.00)

## 2017-04-02 LAB — TSH: TSH: 3.655 u[IU]/mL (ref 0.350–4.500)

## 2017-04-02 LAB — T4, FREE: Free T4: 0.94 ng/dL (ref 0.61–1.12)

## 2017-04-02 MED ORDER — CYANOCOBALAMIN 1000 MCG/ML IJ SOLN
1000.0000 ug | Freq: Once | INTRAMUSCULAR | Status: AC
  Administered 2017-04-02: 1000 ug via INTRAMUSCULAR
  Filled 2017-04-02: qty 1

## 2017-04-02 NOTE — Progress Notes (Signed)
Patient states for the past month he has lightheadedness when he gets up from a sitting or lying position.  Otherwise, no complaints.  Patient has rx from Dr. Laurelyn Sickle office for fasting labs.  Wants to know if he can have them drawn here.  Advised him to discuss with Dr. Mike Gip at this visit.

## 2017-04-02 NOTE — Progress Notes (Signed)
[place] [facility] day:  04/02/2017    Chief Complaint: Charles Flores is an 83 y.o. male with prostate cancer and B12 deficiency who is seen for 1 month asessment.  HPI: The patient was last seen in the medical oncology by Faythe Casa, NP on 02/27/2017.  At that time, he felt well.  He denied any pain.  Weight was down 9 pounds.  He stated that he ate less in the summer.  He was encouraged to use supplements (Ensure or Boost) and eat high caloric foods.  He received Lupron.  He continued Casodex.  PSA was 26.69, up from 16.41.  He received monthly B12 on 03/05/2017.  During the interim, he has felt "fair".  He denies any pain.  He is eating well, bit losing weight.  He has lost 3 pound in the past month.  He denies any nausea, vomiting or diarrhea.   Past Medical History:  Diagnosis Date  . Anemia 12/24/2014  . Arthritis   . B12 deficiency anemia 01/01/2015  . Cataract   . Coronary artery disease   . GERD (gastroesophageal reflux disease)   . Hyperlipidemia   . Hyperlipidemia   . Prostate cancer ([place])   . Thyroid nodule 03/14/2016    Past Surgical History:  Procedure Laterality Date  . PROSTATE BIOPSY      Family History  Problem Relation Age of Onset  . Cancer Mother   . Breast cancer Mother   . Kidney disease Father   . Diabetes Sister   . Stroke Sister   . Gastric cancer Sister     Social History:  reports that he has quit smoking. His smoking use included Cigarettes. He has a 7.50 pack-year smoking history. He has never used smokeless tobacco. He reports that he does not drink alcohol or use drugs.  He lives in [place].  He is alone today.  Allergies: No Known Allergies  Current Outpatient Prescriptions  Medication Sig Dispense Refill  . bicalutamide (CASODEX) 50 MG tablet TAKE 1 TABLET (50 MG TOTAL) BY MOUTH DAILY. 90 tablet 0  . polyethylene glycol (MIRALAX / GLYCOLAX) packet Take 3,350 packets by mouth continuous as needed.   5   No current facility-administered medications for this visit.    ROS  GENERAL:  Feels "fair".  No fevers.  Weight down 3 pounds.  PERFORMANCE STATUS (ECOG):  1 HEENT:  No visual changes, runny nose, sore throat, mouth sores or tenderness. Lungs: Shortness of breath with exertion (stable).  No cough.  No hemoptysis. Cardiac:  No chest pain, palpitations, orthopnea, or PND. GI:  Eating well.  No nausea, vomiting, diarrhea, constipation, melena or hematochezia. GU:  No urgency, frequency, dysuria, or hematuria. Musculoskeletal:  No back pain.  No joint pain.  No muscle tenderness. Extremities:  No pain or swelling. Skin:  No rashes or skin changes. Neuro:  No headache, numbness or weakness.  Sometimes balance or coordination issues. Endocrine:  No diabetes, thyroid issues.  Rare hot flashes. Psych:  No mood changes, depression or anxiety. Pain:  No focal pain. Review of systems:  All other systems reviewed and found to be negative.  Physical Exam Blood pressure 103/69, pulse 76, temperature (!) 95.7 F (35.4 C), temperature source Tympanic, weight 145 lb 1 oz (65.8 kg).  GENERAL:  Well developed, well nourished gentleman sitting comfortably in the exam room in no acute distress. MENTAL STATUS:  Alert and oriented to person, place and time. HEAD:  Alopecia.  Slight  gray mustache.  Normocephalic, atraumatic, face symmetric, no Cushingoid features. EYES:  Brown eyes (prominent) with slight lid lag.  Pupils equal round and reactive to light and accomodation.  No conjunctivitis or scleral icterus. ENT:  Oropharynx clear without lesion. Dentures.  Tongue normal. Mucous membranes moist. RESPIRATORY:  Clear to auscultation without rales, wheezes or rhonchi. CARDIOVASCULAR:  Regular rate and rhythm without murmur, rub or gallop. ABDOMEN:  Soft, non-tender, with active bowel sounds, and no hepatosplenomegaly.  No masses. SKIN:  No rashes, ulcers or lesions. EXTREMITIES: No edema, skin  discoloration or tenderness.  No palpable cords. LYMPH NODES: No palpable cervical, supraclavicular, axillary or inguinal adenopathy  NEUROLOGICAL: Unremarkable. PSYCH:  Appropriate   Appointment on 04/02/2017  Component Date Value Ref Range Status  . Sodium 04/02/2017 137  135 - 145 mmol/L Final  . Potassium 04/02/2017 4.1  3.5 - 5.1 mmol/L Final  . Chloride 04/02/2017 105  101 - 111 mmol/L Final  . CO2 04/02/2017 23  22 - 32 mmol/L Final  . Glucose, Bld 04/02/2017 90  65 - 99 mg/dL Final  . BUN 04/02/2017 29* 6 - 20 mg/dL Final  . Creatinine, Ser 04/02/2017 1.64* 0.61 - 1.24 mg/dL Final  . Calcium 04/02/2017 9.2  8.9 - 10.3 mg/dL Final  . Total Protein 04/02/2017 6.8  6.5 - 8.1 g/dL Final  . Albumin 04/02/2017 3.9  3.5 - 5.0 g/dL Final  . AST 04/02/2017 15  15 - 41 U/L Final  . ALT 04/02/2017 7* 17 - 63 U/L Final  . Alkaline Phosphatase 04/02/2017 60  38 - 126 U/L Final  . Total Bilirubin 04/02/2017 0.7  0.3 - 1.2 mg/dL Final  . GFR calc non Af Amer 04/02/2017 36* >60 mL/min Final  . GFR calc Af Amer 04/02/2017 41* >60 mL/min Final   Comment: (NOTE) The eGFR has been calculated using the CKD EPI equation. This calculation has not been validated in all clinical situations. eGFR's persistently <60 mL/min signify possible Chronic Kidney Disease.   . Anion gap 04/02/2017 9  5 - 15 Final  . WBC 04/02/2017 3.8  3.8 - 10.6 K/uL Final  . RBC 04/02/2017 3.16* 4.40 - 5.90 MIL/uL Final  . Hemoglobin 04/02/2017 10.6* 13.0 - 18.0 g/dL Final  . HCT 04/02/2017 30.3* 40.0 - 52.0 % Final  . MCV 04/02/2017 95.9  80.0 - 100.0 fL Final  . MCH 04/02/2017 33.7  26.0 - 34.0 pg Final  . MCHC 04/02/2017 35.1  32.0 - 36.0 g/dL Final  . RDW 04/02/2017 14.0  11.5 - 14.5 % Final  . Platelets 04/02/2017 199  150 - 440 K/uL Final  . Neutrophils Relative % 04/02/2017 55  % Final  . Neutro Abs 04/02/2017 2.1  1.4 - 6.5 K/uL Final  . Lymphocytes Relative 04/02/2017 28  % Final  . Lymphs Abs 04/02/2017 1.1   1.0 - 3.6 K/uL Final  . Monocytes Relative 04/02/2017 13  % Final  . Monocytes Absolute 04/02/2017 0.5  0.2 - 1.0 K/uL Final  . Eosinophils Relative 04/02/2017 3  % Final  . Eosinophils Absolute 04/02/2017 0.1  0 - 0.7 K/uL Final  . Basophils Relative 04/02/2017 1  % Final  . Basophils Absolute 04/02/2017 0.0  0 - 0.1 K/uL Final    Assessment:  The patient is a 83 year old African American gentleman with prostate cancer and a rising PSA on Lupron.  Prostate cancer was discovered approximately 20 years secondary to an elevated PSA.  He underwent biopsy at [facility] (no report available). Initial PSA was 774.    Abdomen and pelvic CT on 01/05/2006 revealed an enlarged and heterogeneous prostate with mass effect on the base of the bladder.  There was no adenopathy.  Chest, abdomen, and pelvic CT on 03/15/2016 revealed moderate centrilobular emphysema.  There were mild lucent mottled appearance of the osseous structures (cannot exclude multiple myeloma).  There were scattered pulmonary nodules (4 mm or less in size).  There was a 2.7 cm low-attenuation left thyroid nodule.     Abdomen and pelvic CT on 12/07/2016 revealed no new or progressive findings.  There was no evidence of lymphadenopathy in the abdomen or pelvis. There was a stable appearance of heterogeneous/mottled bony mineralization.  Work-up on 03/17/2016 revealed no monoclonal protein.  SPEP revealed no monoclonal protein.  Kappa free light chains were 28.9, lambda free light chains 20.7 with a ratio of 1.40 (normal).  Immunoglobulins were normal (IgG 1391, IgA 282, IgM 29).  24 hour urine revealed no monoclonal protein.  Thyroid ultrasound on 03/22/2016 revealed a solitary 2.9 cm left lower pole nodule.  Thyroid biopsy on 04/14/2016 was benign (Bethesda category II) with abundant foamy macrophages and colloid. 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   He is on Lupron and Casodex.  He receives Lupron 30 mg every 4 months (last 02/27/2017).    PSA has been followed: 127.6 on 06/30/2014, 168.3 on 12/02/2014, 63.32 on 02/01/2015, 40.31 on 03/03/2015, 29.12 on 04/08/2015, 11.28 on 07/14/2015, 11.02 on 08/17/2015, 10.35 on 11/15/2015, 14.37 on 02/15/2016, 12.46 on 06/06/2016, 13.5 on 06/23/2016, 16.63 on 09/05/2016, 20.77 on 11/07/2016, 16.41 on 12/11/2016, 26.69 on 02/27/2017, and 25.67 on 04/02/2017.  He has a normocytic anemia. He was diagnosed with B12 deficiency on 12/24/2014. B12 was 169 (low).  He began B12 supplimentation on 02/01/2015 (last  03/05/2017).  Folate was 11 on 12/24/2014 and 12 on 06/06/2016.  Normal studies included a CMP, ferritin, iron studies, folate, and TSH.   Diet is good. He eats meat 2 times a week. He denies any melena, hematochezia, or hematuria. Last colonoscopy was more than 10 years ago.  Guaiac cards x 3 were negative in 02/2016.  Symptomatically, he feels good.  He is losing weight despite eating well.  He denies any pain.  Exam is stable. Hematocrit is 30.3 with a hemoglobin of 10.6.  Creatinine has increased from 1.29 to 1.64 (CrCl 29 ml/min) over the past 5 months.  Plan:   1.  Labs today:  CBC with diff, CMP, PSA. 2.  Add:  TSH and free T4. 3.  Discuss increase in PSA last month (? different lab).  Discuss plan for reimaging and change in therapy if PSA continues to climb. 4.  Discuss increase in creatinine.  Discuss fluids.  No new medications.  Discuss renal ultrasound. 5.  Continue Casodex. 6.  B12 today. 7.  Renal ultrasound. 8.  BMP in 2 weeks. 9.  RTC in 1 month for MD assessment, labs (CBC with diff, BMP, PSA), and B12.   Lequita Asal, MD  04/02/2017, 3:16 PM

## 2017-04-09 ENCOUNTER — Ambulatory Visit: Payer: Self-pay | Admitting: Hematology and Oncology

## 2017-04-09 ENCOUNTER — Other Ambulatory Visit: Payer: Self-pay

## 2017-04-15 ENCOUNTER — Encounter: Payer: Self-pay | Admitting: Hematology and Oncology

## 2017-04-15 DIAGNOSIS — N289 Disorder of kidney and ureter, unspecified: Secondary | ICD-10-CM

## 2017-04-15 HISTORY — DX: Disorder of kidney and ureter, unspecified: N28.9

## 2017-04-16 ENCOUNTER — Inpatient Hospital Stay: Payer: Medicare Other

## 2017-04-18 ENCOUNTER — Inpatient Hospital Stay: Payer: Medicare Other

## 2017-04-18 DIAGNOSIS — E785 Hyperlipidemia, unspecified: Secondary | ICD-10-CM | POA: Diagnosis not present

## 2017-04-18 DIAGNOSIS — Z79818 Long term (current) use of other agents affecting estrogen receptors and estrogen levels: Secondary | ICD-10-CM | POA: Diagnosis not present

## 2017-04-18 DIAGNOSIS — R9721 Rising PSA following treatment for malignant neoplasm of prostate: Secondary | ICD-10-CM | POA: Diagnosis not present

## 2017-04-18 DIAGNOSIS — K219 Gastro-esophageal reflux disease without esophagitis: Secondary | ICD-10-CM | POA: Diagnosis not present

## 2017-04-18 DIAGNOSIS — D519 Vitamin B12 deficiency anemia, unspecified: Secondary | ICD-10-CM

## 2017-04-18 DIAGNOSIS — Z803 Family history of malignant neoplasm of breast: Secondary | ICD-10-CM | POA: Diagnosis not present

## 2017-04-18 DIAGNOSIS — Z79899 Other long term (current) drug therapy: Secondary | ICD-10-CM | POA: Diagnosis not present

## 2017-04-18 DIAGNOSIS — R634 Abnormal weight loss: Secondary | ICD-10-CM

## 2017-04-18 DIAGNOSIS — E041 Nontoxic single thyroid nodule: Secondary | ICD-10-CM | POA: Diagnosis not present

## 2017-04-18 DIAGNOSIS — C61 Malignant neoplasm of prostate: Secondary | ICD-10-CM

## 2017-04-18 DIAGNOSIS — I2510 Atherosclerotic heart disease of native coronary artery without angina pectoris: Secondary | ICD-10-CM | POA: Diagnosis not present

## 2017-04-18 DIAGNOSIS — R918 Other nonspecific abnormal finding of lung field: Secondary | ICD-10-CM | POA: Diagnosis not present

## 2017-04-18 DIAGNOSIS — Z87891 Personal history of nicotine dependence: Secondary | ICD-10-CM | POA: Diagnosis not present

## 2017-04-18 DIAGNOSIS — Z800 Family history of malignant neoplasm of digestive organs: Secondary | ICD-10-CM | POA: Diagnosis not present

## 2017-04-18 LAB — BASIC METABOLIC PANEL
Anion gap: 6 (ref 5–15)
BUN: 22 mg/dL — ABNORMAL HIGH (ref 6–20)
CO2: 24 mmol/L (ref 22–32)
Calcium: 9.2 mg/dL (ref 8.9–10.3)
Chloride: 107 mmol/L (ref 101–111)
Creatinine, Ser: 1.44 mg/dL — ABNORMAL HIGH (ref 0.61–1.24)
GFR calc Af Amer: 48 mL/min — ABNORMAL LOW (ref >60)
GFR calc non Af Amer: 42 mL/min — ABNORMAL LOW (ref >60)
Glucose, Bld: 102 mg/dL — ABNORMAL HIGH (ref 65–99)
Potassium: 3.7 mmol/L (ref 3.5–5.1)
Sodium: 137 mmol/L (ref 135–145)

## 2017-04-30 ENCOUNTER — Inpatient Hospital Stay: Payer: Medicare Other | Attending: Hematology and Oncology

## 2017-04-30 DIAGNOSIS — D519 Vitamin B12 deficiency anemia, unspecified: Secondary | ICD-10-CM

## 2017-04-30 DIAGNOSIS — E538 Deficiency of other specified B group vitamins: Secondary | ICD-10-CM | POA: Insufficient documentation

## 2017-04-30 DIAGNOSIS — R9721 Rising PSA following treatment for malignant neoplasm of prostate: Secondary | ICD-10-CM | POA: Diagnosis not present

## 2017-04-30 DIAGNOSIS — Z87891 Personal history of nicotine dependence: Secondary | ICD-10-CM | POA: Insufficient documentation

## 2017-04-30 DIAGNOSIS — C61 Malignant neoplasm of prostate: Secondary | ICD-10-CM | POA: Insufficient documentation

## 2017-04-30 DIAGNOSIS — Z800 Family history of malignant neoplasm of digestive organs: Secondary | ICD-10-CM | POA: Diagnosis not present

## 2017-04-30 DIAGNOSIS — Z79899 Other long term (current) drug therapy: Secondary | ICD-10-CM | POA: Insufficient documentation

## 2017-04-30 DIAGNOSIS — K219 Gastro-esophageal reflux disease without esophagitis: Secondary | ICD-10-CM | POA: Insufficient documentation

## 2017-04-30 DIAGNOSIS — Z803 Family history of malignant neoplasm of breast: Secondary | ICD-10-CM | POA: Diagnosis not present

## 2017-04-30 DIAGNOSIS — I2510 Atherosclerotic heart disease of native coronary artery without angina pectoris: Secondary | ICD-10-CM | POA: Diagnosis not present

## 2017-04-30 DIAGNOSIS — M1990 Unspecified osteoarthritis, unspecified site: Secondary | ICD-10-CM | POA: Diagnosis not present

## 2017-04-30 DIAGNOSIS — Z79818 Long term (current) use of other agents affecting estrogen receptors and estrogen levels: Secondary | ICD-10-CM | POA: Insufficient documentation

## 2017-04-30 DIAGNOSIS — E785 Hyperlipidemia, unspecified: Secondary | ICD-10-CM | POA: Diagnosis not present

## 2017-04-30 MED ORDER — CYANOCOBALAMIN 1000 MCG/ML IJ SOLN
1000.0000 ug | Freq: Once | INTRAMUSCULAR | Status: AC
  Administered 2017-04-30: 1000 ug via INTRAMUSCULAR
  Filled 2017-04-30: qty 1

## 2017-05-03 ENCOUNTER — Inpatient Hospital Stay: Payer: Medicare Other | Admitting: Hematology and Oncology

## 2017-05-03 ENCOUNTER — Ambulatory Visit: Payer: Self-pay | Admitting: Hematology and Oncology

## 2017-05-03 ENCOUNTER — Inpatient Hospital Stay: Payer: Medicare Other

## 2017-05-03 ENCOUNTER — Other Ambulatory Visit: Payer: Self-pay

## 2017-05-03 NOTE — Progress Notes (Deleted)
[facility] day:  05/03/2017    Chief Complaint: Charles Flores is an 83 y.o. male with prostate cancer and B12 deficiency who is seen for 1 month asessment.  HPI: The patient was last seen in the medical oncology on 04/02/2017.  At that time, he felt "fair".  He denied any pain.  He was eating well, but losing weight (3 pounds).  Exam was stable.  Hematocrit was 30.3 with a hemoglobin of 10.6.  Creatinine had increased from 1.29 to 1.64 (CrCl 29 ml/min) over the past 5 months.  PSA was stable.  TSH and free T4 were normal.  Casodex continued.  Renal ultrasound was ordered, but not performed.  He received B12 on 08/13 and 04/30/2017.    During the interim,   Past Medical History:  Diagnosis Date  . Anemia 12/24/2014  . Arthritis   . B12 deficiency anemia 01/01/2015  . Cataract   . Coronary artery disease   . GERD (gastroesophageal reflux disease)   . Hyperlipidemia   . Hyperlipidemia   . Prostate cancer ([place])   . Renal insufficiency 04/15/2017  . Thyroid nodule 03/14/2016    Past Surgical History:  Procedure Laterality Date  . PROSTATE BIOPSY      Family History  Problem Relation Age of Onset  . Cancer Mother   . Breast cancer Mother   . Kidney disease Father   . Diabetes Sister   . Stroke Sister   . Gastric cancer Sister     Social History:  reports that he has quit smoking. His smoking use included Cigarettes. He has a 7.50 pack-year smoking history. He has never used smokeless tobacco. He reports that he does not drink alcohol or use drugs.  He lives in [place].  He is alone today.  Allergies: No Known Allergies  Current Outpatient Prescriptions  Medication Sig Dispense Refill  . bicalutamide (CASODEX) 50 MG tablet TAKE 1 TABLET (50 MG TOTAL) BY MOUTH DAILY. 90 tablet 0  . polyethylene glycol (MIRALAX / GLYCOLAX) packet Take 3,350 packets by mouth continuous as needed.  5   No current facility-administered medications for  this visit.    ROS  GENERAL:  Feels "fair".  No fevers.  Weight down 3 pounds.  PERFORMANCE STATUS (ECOG):  1 HEENT:  No visual changes, runny nose, sore throat, mouth sores or tenderness. Lungs: Shortness of breath with exertion (stable).  No cough.  No hemoptysis. Cardiac:  No chest pain, palpitations, orthopnea, or PND. GI:  Eating well.  No nausea, vomiting, diarrhea, constipation, melena or hematochezia. GU:  No urgency, frequency, dysuria, or hematuria. Musculoskeletal:  No back pain.  No joint pain.  No muscle tenderness. Extremities:  No pain or swelling. Skin:  No rashes or skin changes. Neuro:  No headache, numbness or weakness.  Sometimes balance or coordination issues. Endocrine:  No diabetes, thyroid issues.  Rare hot flashes. Psych:  No mood changes, depression or anxiety. Pain:  No focal pain. Review of systems:  All other systems reviewed and found to be negative.  Physical Exam There were no vitals taken for this visit.  GENERAL:  Well developed, well nourished gentleman sitting comfortably in the exam room in no acute distress. MENTAL STATUS:  Alert and oriented to person, place and time. HEAD:  Alopecia.  Slight gray mustache.  Normocephalic, atraumatic, face symmetric, no Cushingoid features. EYES:  Brown eyes (prominent) with slight lid lag.  Pupils equal round and reactive to light and accomodation.  No conjunctivitis or scleral icterus. ENT:  Oropharynx clear without lesion. Dentures.  Tongue normal. Mucous membranes moist. RESPIRATORY:  Clear to auscultation without rales, wheezes or rhonchi. CARDIOVASCULAR:  Regular rate and rhythm without murmur, rub or gallop. ABDOMEN:  Soft, non-tender, with active bowel sounds, and no hepatosplenomegaly.  No masses. SKIN:  No rashes, ulcers or lesions. EXTREMITIES: No edema, skin discoloration or tenderness.  No palpable cords. LYMPH NODES: No palpable cervical, supraclavicular, axillary or inguinal adenopathy   NEUROLOGICAL: Unremarkable. PSYCH:  Appropriate   No visits with results within 3 Day(s) from this visit.  Latest known visit with results is:  Appointment on 04/18/2017  Component Date Value Ref Range Status  . Sodium 04/18/2017 137  135 - 145 mmol/L Final  . Potassium 04/18/2017 3.7  3.5 - 5.1 mmol/L Final  . Chloride 04/18/2017 107  101 - 111 mmol/L Final  . CO2 04/18/2017 24  22 - 32 mmol/L Final  . Glucose, Bld 04/18/2017 102* 65 - 99 mg/dL Final  . BUN 04/18/2017 22* 6 - 20 mg/dL Final  . Creatinine, Ser 04/18/2017 1.44* 0.61 - 1.24 mg/dL Final  . Calcium 04/18/2017 9.2  8.9 - 10.3 mg/dL Final  . GFR calc non Af Amer 04/18/2017 42* >60 mL/min Final  . GFR calc Af Amer 04/18/2017 48* >60 mL/min Final   Comment: (NOTE) The eGFR has been calculated using the CKD EPI equation. This calculation has not been validated in all clinical situations. eGFR's persistently <60 mL/min signify possible Chronic Kidney Disease.   . Anion gap 04/18/2017 6  5 - 15 Final    Assessment:  The patient is a 83 year old African American gentleman with prostate cancer and a rising PSA on Lupron.  Prostate cancer was discovered approximately 20 years secondary to an elevated PSA.  He underwent biopsy at [facility] (no report available). Initial PSA was 774.    Abdomen and pelvic CT on 01/05/2006 revealed an enlarged and heterogeneous prostate with mass effect on the base of the bladder.  There was no adenopathy.  Chest, abdomen, and pelvic CT on 03/15/2016 revealed moderate centrilobular emphysema.  There were mild lucent mottled appearance of the osseous structures (cannot exclude multiple myeloma).  There were scattered pulmonary nodules (4 mm or less in size).  There was a 2.7 cm low-attenuation left thyroid nodule.     Abdomen and pelvic CT on 12/07/2016 revealed no new or progressive findings.  There was no evidence of lymphadenopathy in the abdomen or pelvis. There was a stable  appearance of heterogeneous/mottled bony mineralization.  Work-up on 03/17/2016 revealed no monoclonal protein.  SPEP revealed no monoclonal protein.  Kappa free light chains were 28.9, lambda free light chains 20.7 with a ratio of 1.40 (normal).  Immunoglobulins were normal (IgG 1391, IgA 282, IgM 29).  24 hour urine revealed no monoclonal protein.  Thyroid ultrasound on 03/22/2016 revealed a solitary 2.9 cm left lower pole nodule.  Thyroid biopsy on 04/14/2016 was benign (Bethesda category II) with abundant foamy macrophages and colloid. 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   He is on Lupron and Casodex.  He receives Lupron 30 mg every 4 months (last 02/27/2017).    PSA has been followed: 127.6 on 06/30/2014, 168.3 on 12/02/2014, 63.32 on 02/01/2015, 40.31 on 03/03/2015, 29.12 on 04/08/2015, 11.28 on 07/14/2015, 11.02 on 08/17/2015, 10.35 on 11/15/2015, 14.37 on 02/15/2016,  12.46 on 06/06/2016, 13.5 on 06/23/2016, 16.63 on 09/05/2016, 20.77 on 11/07/2016, 16.41 on 12/11/2016, 26.69 on 02/27/2017, and 25.67 on 04/02/2017.  He has a normocytic anemia. He was diagnosed with B12 deficiency on 12/24/2014. B12 was 169 (low).  He began B12 supplimentation on 02/01/2015 (last 04/30/2017).  Folate was 11 on 12/24/2014 and 12 on 06/06/2016.  Normal studies included a CMP, ferritin, iron studies, folate, and TSH.   Diet is good. He eats meat 2 times a week. He denies any melena, hematochezia, or hematuria. Last colonoscopy was more than 10 years ago.  Guaiac cards x 3 were negative in 02/2016.  Symptomatically, he feels good.  He is losing weight despite eating well.  He denies any pain.  Exam is stable. Hematocrit is 30.3 with a hemoglobin of 10.6.  Creatinine has increased from 1.29 to 1.64 (CrCl 29 ml/min) over the past 5 months.  Plan:   1.  Labs today:  CBC  with diff, BMP, PSA.  2.  Add:  TSH and free T4. 3.  Discuss increase in PSA last month (? different lab).  Discuss plan for reimaging and change in therapy if PSA continues to climb. 4.  Discuss increase in creatinine.  Discuss fluids.  No new medications.  Discuss renal ultrasound. 5.  Continue Casodex. 6.  B12 today. 7.  Renal ultrasound. 8.  BMP in 2 weeks. 9.  RTC in 1 month for MD assessment, labs (CBC with diff, BMP, PSA), and B12.   Lequita Asal, MD  05/03/2017, 6:16 AM

## 2017-05-10 ENCOUNTER — Inpatient Hospital Stay: Payer: Medicare Other

## 2017-05-10 ENCOUNTER — Encounter: Payer: Self-pay | Admitting: Hematology and Oncology

## 2017-05-10 ENCOUNTER — Inpatient Hospital Stay (HOSPITAL_BASED_OUTPATIENT_CLINIC_OR_DEPARTMENT_OTHER): Payer: Medicare Other | Admitting: Hematology and Oncology

## 2017-05-10 VITALS — BP 123/83 | HR 75 | Temp 97.2°F | Resp 16 | Wt 148.8 lb

## 2017-05-10 DIAGNOSIS — Z79899 Other long term (current) drug therapy: Secondary | ICD-10-CM

## 2017-05-10 DIAGNOSIS — C61 Malignant neoplasm of prostate: Secondary | ICD-10-CM

## 2017-05-10 DIAGNOSIS — R9721 Rising PSA following treatment for malignant neoplasm of prostate: Secondary | ICD-10-CM | POA: Diagnosis not present

## 2017-05-10 DIAGNOSIS — D519 Vitamin B12 deficiency anemia, unspecified: Secondary | ICD-10-CM

## 2017-05-10 DIAGNOSIS — K219 Gastro-esophageal reflux disease without esophagitis: Secondary | ICD-10-CM | POA: Diagnosis not present

## 2017-05-10 DIAGNOSIS — N289 Disorder of kidney and ureter, unspecified: Secondary | ICD-10-CM

## 2017-05-10 DIAGNOSIS — D649 Anemia, unspecified: Secondary | ICD-10-CM

## 2017-05-10 DIAGNOSIS — E538 Deficiency of other specified B group vitamins: Secondary | ICD-10-CM

## 2017-05-10 DIAGNOSIS — Z79818 Long term (current) use of other agents affecting estrogen receptors and estrogen levels: Secondary | ICD-10-CM

## 2017-05-10 DIAGNOSIS — E785 Hyperlipidemia, unspecified: Secondary | ICD-10-CM | POA: Diagnosis not present

## 2017-05-10 DIAGNOSIS — R634 Abnormal weight loss: Secondary | ICD-10-CM

## 2017-05-10 DIAGNOSIS — M1990 Unspecified osteoarthritis, unspecified site: Secondary | ICD-10-CM

## 2017-05-10 DIAGNOSIS — Z803 Family history of malignant neoplasm of breast: Secondary | ICD-10-CM | POA: Diagnosis not present

## 2017-05-10 DIAGNOSIS — I2510 Atherosclerotic heart disease of native coronary artery without angina pectoris: Secondary | ICD-10-CM

## 2017-05-10 DIAGNOSIS — Z87891 Personal history of nicotine dependence: Secondary | ICD-10-CM | POA: Diagnosis not present

## 2017-05-10 DIAGNOSIS — Z800 Family history of malignant neoplasm of digestive organs: Secondary | ICD-10-CM | POA: Diagnosis not present

## 2017-05-10 LAB — BASIC METABOLIC PANEL
Anion gap: 6 (ref 5–15)
BUN: 23 mg/dL — ABNORMAL HIGH (ref 6–20)
CO2: 24 mmol/L (ref 22–32)
Calcium: 9.2 mg/dL (ref 8.9–10.3)
Chloride: 107 mmol/L (ref 101–111)
Creatinine, Ser: 1.31 mg/dL — ABNORMAL HIGH (ref 0.61–1.24)
GFR calc Af Amer: 54 mL/min — ABNORMAL LOW (ref >60)
GFR calc non Af Amer: 47 mL/min — ABNORMAL LOW (ref >60)
Glucose, Bld: 90 mg/dL (ref 65–99)
Potassium: 3.9 mmol/L (ref 3.5–5.1)
Sodium: 137 mmol/L (ref 135–145)

## 2017-05-10 LAB — IRON AND TIBC
Iron: 79 ug/dL (ref 45–182)
Saturation Ratios: 26 % (ref 17.9–39.5)
TIBC: 303 ug/dL (ref 250–450)
UIBC: 224 ug/dL

## 2017-05-10 LAB — FERRITIN: Ferritin: 153 ng/mL (ref 24–336)

## 2017-05-10 LAB — CBC WITH DIFFERENTIAL/PLATELET
Basophils Absolute: 0.1 10*3/uL (ref 0–0.1)
Basophils Relative: 1 %
Eosinophils Absolute: 0.2 10*3/uL (ref 0–0.7)
Eosinophils Relative: 5 %
HCT: 31.9 % — ABNORMAL LOW (ref 40.0–52.0)
Hemoglobin: 11.1 g/dL — ABNORMAL LOW (ref 13.0–18.0)
Lymphocytes Relative: 33 %
Lymphs Abs: 1.4 10*3/uL (ref 1.0–3.6)
MCH: 33.9 pg (ref 26.0–34.0)
MCHC: 34.8 g/dL (ref 32.0–36.0)
MCV: 97.4 fL (ref 80.0–100.0)
Monocytes Absolute: 0.5 10*3/uL (ref 0.2–1.0)
Monocytes Relative: 12 %
Neutro Abs: 2.1 10*3/uL (ref 1.4–6.5)
Neutrophils Relative %: 49 %
Platelets: 181 10*3/uL (ref 150–440)
RBC: 3.28 MIL/uL — ABNORMAL LOW (ref 4.40–5.90)
RDW: 14.5 % (ref 11.5–14.5)
WBC: 4.4 10*3/uL (ref 3.8–10.6)

## 2017-05-10 LAB — RETICULOCYTES
RBC.: 3.34 MIL/uL — ABNORMAL LOW (ref 4.40–5.90)
Retic Count, Absolute: 66.8 10*3/uL (ref 19.0–183.0)
Retic Ct Pct: 2 % (ref 0.4–3.1)

## 2017-05-10 LAB — PSA: Prostatic Specific Antigen: 19.16 ng/mL — ABNORMAL HIGH (ref 0.00–4.00)

## 2017-05-10 LAB — FOLATE: Folate: 11.9 ng/mL (ref >5.9)

## 2017-05-10 NOTE — Progress Notes (Signed)
[facility] day:  05/10/2017    Chief Complaint: Charles Flores is an 83 y.o. male with prostate cancer and B12 deficiency who is seen for 1 month assessment.  HPI: The patient was last seen in the medical oncology on 04/02/2017.  At that time, he felt "fair".  He denied any pain.  He was eating well, but losing weight (3 pounds).  Exam was stable.  Hematocrit was 30.3 with a hemoglobin of 10.6.  Creatinine had increased from 1.29 to 1.64 (CrCl 29 ml/min) over the past 5 months.  PSA was stable.  TSH and free T4 were normal.  Casodex continued.  Renal ultrasound was ordered, but not performed.  He received B12 on 08/13 and 04/30/2017.    During the interim, he has done "very well".  He denies nausea, vomiting, and diarrhea. He is eating better; weight up 3 pounds this visit. He denies pain. He has had no interval infections.    Past Medical History:  Diagnosis Date  . Anemia 12/24/2014  . Arthritis   . B12 deficiency anemia 01/01/2015  . Cataract   . Coronary artery disease   . GERD (gastroesophageal reflux disease)   . Hyperlipidemia   . Hyperlipidemia   . Prostate cancer ([place])   . Renal insufficiency 04/15/2017  . Thyroid nodule 03/14/2016    Past Surgical History:  Procedure Laterality Date  . PROSTATE BIOPSY      Family History  Problem Relation Age of Onset  . Cancer Mother   . Breast cancer Mother   . Kidney disease Father   . Diabetes Sister   . Stroke Sister   . Gastric cancer Sister     Social History:  reports that he has quit smoking. His smoking use included Cigarettes. He has a 7.50 pack-year smoking history. He has never used smokeless tobacco. He reports that he does not drink alcohol or use drugs.  He lives in [place].  He is alone today.  Allergies: No Known Allergies  Current Outpatient Prescriptions  Medication Sig Dispense Refill  . bicalutamide (CASODEX) 50 MG tablet TAKE 1 TABLET (50 MG TOTAL) BY MOUTH DAILY.  90 tablet 0  . polyethylene glycol (MIRALAX / GLYCOLAX) packet Take 3,350 packets by mouth continuous as needed.  5   No current facility-administered medications for this visit.    ROS  GENERAL:  Feels "very well".  No fevers.  Weight up 3 pounds.  PERFORMANCE STATUS (ECOG):  1 HEENT:  No visual changes, runny nose, sore throat, mouth sores or tenderness. Lungs: Shortness of breath with exertion (stable).  No cough.  No hemoptysis. Cardiac:  No chest pain, palpitations, orthopnea, or PND. GI:  Eating well.  Trouble chewing.  No nausea, vomiting, diarrhea, constipation, melena or hematochezia. GU:  No urgency, frequency, dysuria, or hematuria. Musculoskeletal:  No back pain.  No joint pain.  No muscle tenderness. Extremities:  No pain or swelling. Skin:  No rashes or skin changes. Neuro:  No headache, numbness or weakness.  Sometimes balance or coordination issues. Endocrine:  No diabetes, thyroid issues.  Rare hot flashes. Psych:  No mood changes, depression or anxiety. Pain:  No focal pain. Review of systems:  All other systems reviewed and found to be negative.  Physical Exam Blood pressure 123/83, pulse 75, temperature (!) 97.2 F (36.2 C), temperature source Tympanic, resp. rate 16, weight 148 lb 12.8 oz (67.5 kg).  GENERAL:  Well developed, well nourished gentleman sitting comfortably in  the exam room in no acute distress. MENTAL STATUS:  Alert and oriented to person, place and time. HEAD:  Alopecia.  Slight gray mustache.  Normocephalic, atraumatic, face symmetric, no Cushingoid features. EYES:  Brown eyes (prominent) with slight lid lag.  Pupils equal round and reactive to light and accomodation.  No conjunctivitis or scleral icterus. ENT:  Oropharynx clear without lesion. Dentures.  Tongue normal. Mucous membranes moist. RESPIRATORY:  Clear to auscultation without rales, wheezes or rhonchi. CARDIOVASCULAR:  Regular rate and rhythm without murmur, rub or gallop. ABDOMEN:   Soft, non-tender, with active bowel sounds, and no hepatosplenomegaly.  No masses. SKIN:  No rashes, ulcers or lesions. EXTREMITIES: No edema, skin discoloration or tenderness.  No palpable cords. LYMPH NODES: No palpable cervical, supraclavicular, axillary or inguinal adenopathy  NEUROLOGICAL: Unremarkable. PSYCH:  Appropriate   Appointment on 05/10/2017  Component Date Value Ref Range Status  . Sodium 05/10/2017 137  135 - 145 mmol/L Final  . Potassium 05/10/2017 3.9  3.5 - 5.1 mmol/L Final  . Chloride 05/10/2017 107  101 - 111 mmol/L Final  . CO2 05/10/2017 24  22 - 32 mmol/L Final  . Glucose, Bld 05/10/2017 90  65 - 99 mg/dL Final  . BUN 05/10/2017 23* 6 - 20 mg/dL Final  . Creatinine, Ser 05/10/2017 1.31* 0.61 - 1.24 mg/dL Final  . Calcium 05/10/2017 9.2  8.9 - 10.3 mg/dL Final  . GFR calc non Af Amer 05/10/2017 47* >60 mL/min Final  . GFR calc Af Amer 05/10/2017 54* >60 mL/min Final   Comment: (NOTE) The eGFR has been calculated using the CKD EPI equation. This calculation has not been validated in all clinical situations. eGFR's persistently <60 mL/min signify possible Chronic Kidney Disease.   . Anion gap 05/10/2017 6  5 - 15 Final  . WBC 05/10/2017 4.4  3.8 - 10.6 K/uL Final  . RBC 05/10/2017 3.28* 4.40 - 5.90 MIL/uL Final  . Hemoglobin 05/10/2017 11.1* 13.0 - 18.0 g/dL Final  . HCT 05/10/2017 31.9* 40.0 - 52.0 % Final  . MCV 05/10/2017 97.4  80.0 - 100.0 fL Final  . MCH 05/10/2017 33.9  26.0 - 34.0 pg Final  . MCHC 05/10/2017 34.8  32.0 - 36.0 g/dL Final  . RDW 05/10/2017 14.5  11.5 - 14.5 % Final  . Platelets 05/10/2017 181  150 - 440 K/uL Final  . Neutrophils Relative % 05/10/2017 49  % Final  . Neutro Abs 05/10/2017 2.1  1.4 - 6.5 K/uL Final  . Lymphocytes Relative 05/10/2017 33  % Final  . Lymphs Abs 05/10/2017 1.4  1.0 - 3.6 K/uL Final  . Monocytes Relative 05/10/2017 12  % Final  . Monocytes Absolute 05/10/2017 0.5  0.2 - 1.0 K/uL Final  . Eosinophils  Relative 05/10/2017 5  % Final  . Eosinophils Absolute 05/10/2017 0.2  0 - 0.7 K/uL Final  . Basophils Relative 05/10/2017 1  % Final  . Basophils Absolute 05/10/2017 0.1  0 - 0.1 K/uL Final  . Retic Ct Pct 05/10/2017 2.0  0.4 - 3.1 % Final  . RBC. 05/10/2017 3.34* 4.40 - 5.90 MIL/uL Final  . Retic Count, Absolute 05/10/2017 66.8  19.0 - 183.0 K/uL Final    Assessment:  The patient is a 83 year old African American gentleman with prostate cancer and a rising PSA on Lupron.  Prostate cancer was discovered approximately 20 years secondary to an elevated PSA.  He underwent biopsy at [facility] (no report available). Initial PSA was  774.    Abdomen and pelvic CT on 01/05/2006 revealed an enlarged and heterogeneous prostate with mass effect on the base of the bladder.  There was no adenopathy.  Chest, abdomen, and pelvic CT on 03/15/2016 revealed moderate centrilobular emphysema.  There were mild lucent mottled appearance of the osseous structures (cannot exclude multiple myeloma).  There were scattered pulmonary nodules (4 mm or less in size).  There was a 2.7 cm low-attenuation left thyroid nodule.     Abdomen and pelvic CT on 12/07/2016 revealed no new or progressive findings.  There was no evidence of lymphadenopathy in the abdomen or pelvis. There was a stable appearance of heterogeneous/mottled bony mineralization.  Work-up on 03/17/2016 revealed no monoclonal protein.  SPEP revealed no monoclonal protein.  Kappa free light chains were 28.9, lambda free light chains 20.7 with a ratio of 1.40 (normal).  Immunoglobulins were normal (IgG 1391, IgA 282, IgM 29).  24 hour urine revealed no monoclonal protein.  Thyroid ultrasound on 03/22/2016 revealed a solitary 2.9 cm left lower pole nodule.  Thyroid biopsy on 04/14/2016 was benign (Bethesda category II) with abundant foamy macrophages and colloid. The cytologic findings were compatible with a cystic colloid nodule  Bone scan on  04/08/2014, 12/29/2014, 02/23/2016, and 12/07/2016 revealed no evidence of metastatic disease.   He is on Lupron and Casodex.  He receives Lupron 30 mg every 4 months (last 02/27/2017).    PSA has been followed: 127.6 on 06/30/2014, 168.3 on 12/02/2014, 63.32 on 02/01/2015, 40.31 on 03/03/2015, 29.12 on 04/08/2015, 11.28 on 07/14/2015, 11.02 on 08/17/2015, 10.35 on 11/15/2015, 14.37 on 02/15/2016, 12.46 on 06/06/2016, 13.5 on 06/23/2016, 16.63 on 09/05/2016, 20.77 on 11/07/2016, 16.41 on 12/11/2016, 26.69 on 02/27/2017, and 25.67 on 04/02/2017.  He has a normocytic anemia. He was diagnosed with B12 deficiency on 12/24/2014. B12 was 169 (low).  He began B12 supplimentation on 02/01/2015 (last 04/30/2017).  Folate was 11 on 12/24/2014 and 12 on 06/06/2016.  Normal studies included a CMP, ferritin, iron studies, folate, and TSH. TSH and free T4 were normal on 04/02/2017.   Diet is good. He eats meat 2 times a week. He denies any melena, hematochezia, or hematuria. Last colonoscopy was more than 10 years ago.  Guaiac cards x 3 were negative in 02/2016.  Symptomatically, he feels "very good".  He is eating well; weight up 3 pounds.  Exam is stable.  Hematocrit is 31.9 with a hemoglobin of 11.1.  Creatinine has decreased from 1.64 down to 1.31 today (CrCl 54 ml/min).  Plan:   1.  Labs today:  CBC with diff, BMP, PSA, folate, ferritin, iron studies, retic. 2.  Continue Casodex 50 mg daily. 3.  Continue monthly B12 (next due 05/28/2017). 4.  Continue Lupron every 4 months (due 07/02/2017). 5.  Renal function improving. Patient notes that he has increased fluid intake. Encouraged patient to continue to increase fluid intake. Will recheck renal function with routine labs at next RTC visit in November.  6.  RTC on 07/02/2017 for MD assessment, labs (CBC with diff, CMP, PSA) and Lupron.    Honor Loh, NP  05/10/2017, 4:14 PM   I saw and evaluated the patient, participating in the key portions of the  service and reviewing pertinent diagnostic studies and records.  I reviewed the nurse practitioner's note and agree with the findings and the plan.  The assessment and plan were discussed with the patient.  A few questions were asked by the patient and answered.   Lequita Asal,  MD 05/10/2017,9:10 PM

## 2017-05-28 ENCOUNTER — Inpatient Hospital Stay: Payer: Medicare Other | Attending: Hematology and Oncology

## 2017-05-28 DIAGNOSIS — R9721 Rising PSA following treatment for malignant neoplasm of prostate: Secondary | ICD-10-CM | POA: Diagnosis not present

## 2017-05-28 DIAGNOSIS — C61 Malignant neoplasm of prostate: Secondary | ICD-10-CM | POA: Insufficient documentation

## 2017-05-28 DIAGNOSIS — E538 Deficiency of other specified B group vitamins: Secondary | ICD-10-CM | POA: Diagnosis not present

## 2017-05-28 DIAGNOSIS — Z79818 Long term (current) use of other agents affecting estrogen receptors and estrogen levels: Secondary | ICD-10-CM | POA: Diagnosis not present

## 2017-05-28 DIAGNOSIS — D519 Vitamin B12 deficiency anemia, unspecified: Secondary | ICD-10-CM

## 2017-05-28 MED ORDER — CYANOCOBALAMIN 1000 MCG/ML IJ SOLN
1000.0000 ug | Freq: Once | INTRAMUSCULAR | Status: AC
  Administered 2017-05-28: 1000 ug via INTRAMUSCULAR
  Filled 2017-05-28: qty 1

## 2017-06-18 ENCOUNTER — Other Ambulatory Visit: Payer: Self-pay | Admitting: Hematology and Oncology

## 2017-07-02 ENCOUNTER — Inpatient Hospital Stay (HOSPITAL_BASED_OUTPATIENT_CLINIC_OR_DEPARTMENT_OTHER): Payer: Medicare Other | Admitting: Hematology and Oncology

## 2017-07-02 ENCOUNTER — Inpatient Hospital Stay: Payer: Medicare Other

## 2017-07-02 ENCOUNTER — Other Ambulatory Visit: Payer: Self-pay | Admitting: *Deleted

## 2017-07-02 ENCOUNTER — Encounter: Payer: Self-pay | Admitting: Hematology and Oncology

## 2017-07-02 ENCOUNTER — Other Ambulatory Visit: Payer: Self-pay | Admitting: Hematology and Oncology

## 2017-07-02 ENCOUNTER — Inpatient Hospital Stay: Payer: Medicare Other | Attending: Hematology and Oncology

## 2017-07-02 VITALS — BP 135/80 | HR 76 | Temp 97.0°F | Wt 153.0 lb

## 2017-07-02 DIAGNOSIS — E785 Hyperlipidemia, unspecified: Secondary | ICD-10-CM | POA: Diagnosis not present

## 2017-07-02 DIAGNOSIS — Z87891 Personal history of nicotine dependence: Secondary | ICD-10-CM

## 2017-07-02 DIAGNOSIS — K219 Gastro-esophageal reflux disease without esophagitis: Secondary | ICD-10-CM | POA: Diagnosis not present

## 2017-07-02 DIAGNOSIS — M1990 Unspecified osteoarthritis, unspecified site: Secondary | ICD-10-CM

## 2017-07-02 DIAGNOSIS — Z79899 Other long term (current) drug therapy: Secondary | ICD-10-CM | POA: Diagnosis not present

## 2017-07-02 DIAGNOSIS — R918 Other nonspecific abnormal finding of lung field: Secondary | ICD-10-CM | POA: Diagnosis not present

## 2017-07-02 DIAGNOSIS — Z79818 Long term (current) use of other agents affecting estrogen receptors and estrogen levels: Secondary | ICD-10-CM | POA: Diagnosis not present

## 2017-07-02 DIAGNOSIS — R9721 Rising PSA following treatment for malignant neoplasm of prostate: Secondary | ICD-10-CM | POA: Insufficient documentation

## 2017-07-02 DIAGNOSIS — D519 Vitamin B12 deficiency anemia, unspecified: Secondary | ICD-10-CM

## 2017-07-02 DIAGNOSIS — I2510 Atherosclerotic heart disease of native coronary artery without angina pectoris: Secondary | ICD-10-CM

## 2017-07-02 DIAGNOSIS — C61 Malignant neoplasm of prostate: Secondary | ICD-10-CM

## 2017-07-02 DIAGNOSIS — D508 Other iron deficiency anemias: Secondary | ICD-10-CM

## 2017-07-02 DIAGNOSIS — Z972 Presence of dental prosthetic device (complete) (partial): Secondary | ICD-10-CM | POA: Diagnosis not present

## 2017-07-02 LAB — COMPREHENSIVE METABOLIC PANEL
ALT: 8 U/L — ABNORMAL LOW (ref 17–63)
AST: 16 U/L (ref 15–41)
Albumin: 3.9 g/dL (ref 3.5–5.0)
Alkaline Phosphatase: 70 U/L (ref 38–126)
Anion gap: 7 (ref 5–15)
BUN: 23 mg/dL — ABNORMAL HIGH (ref 6–20)
CO2: 26 mmol/L (ref 22–32)
Calcium: 9 mg/dL (ref 8.9–10.3)
Chloride: 105 mmol/L (ref 101–111)
Creatinine, Ser: 1.24 mg/dL (ref 0.61–1.24)
GFR calc Af Amer: 58 mL/min — ABNORMAL LOW (ref >60)
GFR calc non Af Amer: 50 mL/min — ABNORMAL LOW (ref >60)
Glucose, Bld: 91 mg/dL (ref 65–99)
Potassium: 3.8 mmol/L (ref 3.5–5.1)
Sodium: 138 mmol/L (ref 135–145)
Total Bilirubin: 0.6 mg/dL (ref 0.3–1.2)
Total Protein: 7 g/dL (ref 6.5–8.1)

## 2017-07-02 LAB — CBC WITH DIFFERENTIAL/PLATELET
Basophils Absolute: 0 10*3/uL (ref 0–0.1)
Basophils Relative: 1 %
Eosinophils Absolute: 0.3 10*3/uL (ref 0–0.7)
Eosinophils Relative: 8 %
HCT: 35 % — ABNORMAL LOW (ref 40.0–52.0)
Hemoglobin: 11.8 g/dL — ABNORMAL LOW (ref 13.0–18.0)
Lymphocytes Relative: 30 %
Lymphs Abs: 1.1 10*3/uL (ref 1.0–3.6)
MCH: 33.2 pg (ref 26.0–34.0)
MCHC: 33.6 g/dL (ref 32.0–36.0)
MCV: 98.9 fL (ref 80.0–100.0)
Monocytes Absolute: 0.5 10*3/uL (ref 0.2–1.0)
Monocytes Relative: 14 %
Neutro Abs: 1.8 10*3/uL (ref 1.4–6.5)
Neutrophils Relative %: 47 %
Platelets: 164 10*3/uL (ref 150–440)
RBC: 3.54 MIL/uL — ABNORMAL LOW (ref 4.40–5.90)
RDW: 14.1 % (ref 11.5–14.5)
WBC: 3.8 10*3/uL (ref 3.8–10.6)

## 2017-07-02 MED ORDER — CYANOCOBALAMIN 1000 MCG/ML IJ SOLN
1000.0000 ug | Freq: Once | INTRAMUSCULAR | Status: AC
  Administered 2017-07-02: 1000 ug via INTRAMUSCULAR
  Filled 2017-07-02: qty 1

## 2017-07-02 MED ORDER — LEUPROLIDE ACETATE (4 MONTH) 30 MG IM KIT
30.0000 mg | PACK | Freq: Once | INTRAMUSCULAR | Status: AC
  Administered 2017-07-02: 30 mg via INTRAMUSCULAR
  Filled 2017-07-02: qty 30

## 2017-07-02 NOTE — Progress Notes (Signed)
Patient here for follow up with lab and Lupron injection. He states that he feels fairly well and denies having any pain. He does report some shortness of breath with exertion.

## 2017-07-02 NOTE — Progress Notes (Signed)
[facility] day:  07/02/2017    Chief Complaint: Charles Flores is an 83 y.o. male with prostate cancer and B12 deficiency who is seen for Lupron and 2 month assessment.  HPI: The patient was last seen in the medical oncology on 05/10/2017.  At that time, he felt "very good".  He was eating well and gaining weight.  Exam was stable.  Hematocrit was 31.9 with a hemoglobin of 11.1.  Creatinine had improved to 1.31 (CrCl 54 ml/min).  He continued Casodex.  Anemia work-up at last visit included a ferritin of 153, iron saturation 26% and TIBC 303. Folate was 11.9.  He has continued monthly B12 (last 05/28/2017).  During the interim, patient has been doing well. He denies physical complaints today. Patient continues to eat well. He has gained 5 pounds since his last visit. Patient denies urinary symptoms. He has experienced no B symptoms or interval infections.    Past Medical History:  Diagnosis Date  . Anemia 12/24/2014  . Arthritis   . B12 deficiency anemia 01/01/2015  . Cataract   . Coronary artery disease   . GERD (gastroesophageal reflux disease)   . Hyperlipidemia   . Hyperlipidemia   . Prostate cancer ([place])   . Renal insufficiency 04/15/2017  . Thyroid nodule 03/14/2016    Past Surgical History:  Procedure Laterality Date  . PROSTATE BIOPSY      Family History  Problem Relation Age of Onset  . Diabetes Sister   . Stroke Sister   . Gastric cancer Sister   . Cancer Mother   . Breast cancer Mother   . Kidney disease Father     Social History:  reports that he has quit smoking. His smoking use included cigarettes. He has a 7.50 pack-year smoking history. he has never used smokeless tobacco. He reports that he does not drink alcohol or use drugs.  He lives in [place].  He is alone today.  Allergies: No Known Allergies  Current Outpatient Medications  Medication Sig Dispense Refill  . bicalutamide (CASODEX) 50 MG tablet TAKE 1  TABLET (50 MG TOTAL) BY MOUTH DAILY. 90 tablet 0  . polyethylene glycol (MIRALAX / GLYCOLAX) packet Take 3,350 packets by mouth continuous as needed.  5   No current facility-administered medications for this visit.    ROS  GENERAL:  Feels "very well".  No fevers.  Weight up 5 pounds.  PERFORMANCE STATUS (ECOG):  1 HEENT:  No visual changes, runny nose, sore throat, mouth sores or tenderness. Lungs: Shortness of breath with exertion.  No cough.  No hemoptysis. Cardiac:  No chest pain, palpitations, orthopnea, or PND. GI:  Eating well.  Trouble chewing.  No nausea, vomiting, diarrhea, constipation, melena or hematochezia. GU:  No urgency, frequency, dysuria, or hematuria. Musculoskeletal:  No back pain.  No joint pain.  No muscle tenderness. Extremities:  No pain or swelling. Skin:  No rashes or skin changes. Neuro:  No headache, numbness or weakness.  Sometimes balance or coordination issues. Endocrine:  No diabetes, thyroid issues.  Rare hot flashes. Psych:  No mood changes, depression or anxiety. Pain:  No focal pain. Review of systems:  All other systems reviewed and found to be negative.  Physical Exam Blood pressure 135/80, pulse 76, temperature (!) 97 F (36.1 C), temperature source Tympanic, weight 153 lb (69.4 kg), SpO2 97 %.  GENERAL:  Well developed, well nourished gentleman sitting comfortably in the exam room in no acute distress. MENTAL  STATUS:  Alert and oriented to person, place and time. HEAD:  Alopecia.  Slight gray mustache.  Normocephalic, atraumatic, face symmetric, no Cushingoid features. EYES:  Brown eyes (prominent) with slight lid lag.  Pupils equal round and reactive to light and accomodation.  No conjunctivitis or scleral icterus. ENT:  Oropharynx clear without lesion. Dentures.  Tongue normal. Mucous membranes moist. RESPIRATORY:  Clear to auscultation without rales, wheezes or rhonchi. CARDIOVASCULAR:  Regular rate and rhythm without murmur, rub or  gallop. ABDOMEN:  Soft, non-tender, with active bowel sounds, and no hepatosplenomegaly.  No masses. SKIN:  No rashes, ulcers or lesions. EXTREMITIES: No edema, skin discoloration or tenderness.  No palpable cords. LYMPH NODES: No palpable cervical, supraclavicular, axillary or inguinal adenopathy  NEUROLOGICAL: Unremarkable. PSYCH:  Appropriate   Appointment on 07/02/2017  Component Date Value Ref Range Status  . WBC 07/02/2017 3.8  3.8 - 10.6 K/uL Final  . RBC 07/02/2017 3.54* 4.40 - 5.90 MIL/uL Final  . Hemoglobin 07/02/2017 11.8* 13.0 - 18.0 g/dL Final  . HCT 07/02/2017 35.0* 40.0 - 52.0 % Final  . MCV 07/02/2017 98.9  80.0 - 100.0 fL Final  . MCH 07/02/2017 33.2  26.0 - 34.0 pg Final  . MCHC 07/02/2017 33.6  32.0 - 36.0 g/dL Final  . RDW 07/02/2017 14.1  11.5 - 14.5 % Final  . Platelets 07/02/2017 164  150 - 440 K/uL Final  . Neutrophils Relative % 07/02/2017 47  % Final  . Neutro Abs 07/02/2017 1.8  1.4 - 6.5 K/uL Final  . Lymphocytes Relative 07/02/2017 30  % Final  . Lymphs Abs 07/02/2017 1.1  1.0 - 3.6 K/uL Final  . Monocytes Relative 07/02/2017 14  % Final  . Monocytes Absolute 07/02/2017 0.5  0.2 - 1.0 K/uL Final  . Eosinophils Relative 07/02/2017 8  % Final  . Eosinophils Absolute 07/02/2017 0.3  0 - 0.7 K/uL Final  . Basophils Relative 07/02/2017 1  % Final  . Basophils Absolute 07/02/2017 0.0  0 - 0.1 K/uL Final    Assessment:  The patient is a 83 year old African American gentleman with prostate cancer and a rising PSA on Lupron.  Prostate cancer was discovered approximately 20 years secondary to an elevated PSA.  He underwent biopsy at [facility] (no report available). Initial PSA was 774.    Abdomen and pelvic CT on 01/05/2006 revealed an enlarged and heterogeneous prostate with mass effect on the base of the bladder.  There was no adenopathy.  Chest, abdomen, and pelvic CT on 03/15/2016 revealed moderate centrilobular emphysema.  There were  mild lucent mottled appearance of the osseous structures (cannot exclude multiple myeloma).  There were scattered pulmonary nodules (4 mm or less in size).  There was a 2.7 cm low-attenuation left thyroid nodule.     Abdomen and pelvic CT on 12/07/2016 revealed no new or progressive findings.  There was no evidence of lymphadenopathy in the abdomen or pelvis. There was a stable appearance of heterogeneous/mottled bony mineralization.  Work-up on 03/17/2016 revealed no monoclonal protein.  SPEP revealed no monoclonal protein.  Kappa free light chains were 28.9, lambda free light chains 20.7 with a ratio of 1.40 (normal).  Immunoglobulins were normal (IgG 1391, IgA 282, IgM 29).  24 hour urine revealed no monoclonal protein.  Thyroid ultrasound on 03/22/2016 revealed a solitary 2.9 cm left lower pole nodule.  Thyroid biopsy on 04/14/2016 was benign (Bethesda category II) with abundant foamy macrophages and colloid. The cytologic findings were  compatible with a cystic colloid nodule  Bone scan on 04/08/2014, 12/29/2014, 02/23/2016, and 12/07/2016 revealed no evidence of metastatic disease.   He is on Lupron and Casodex.  He receives Lupron 30 mg every 4 months (last 02/27/2017).    PSA has been followed: 127.6 on 06/30/2014, 168.3 on 12/02/2014, 63.32 on 02/01/2015, 40.31 on 03/03/2015, 29.12 on 04/08/2015, 11.28 on 07/14/2015, 11.02 on 08/17/2015, 10.35 on 11/15/2015, 14.37 on 02/15/2016, 12.46 on 06/06/2016, 13.5 on 06/23/2016, 16.63 on 09/05/2016, 20.77 on 11/07/2016, 16.41 on 12/11/2016, 26.69 on 02/27/2017, 25.67 on 04/02/2017, 19.16 on 05/10/2017, and 18.94 on 07/02/2017.  He has a normocytic anemia. He was diagnosed with B12 deficiency on 12/24/2014. B12 was 169 (low).  He began B12 supplimentation on 02/01/2015 (last 05/28/2017).  Folate was 11 on 12/24/2014 and 12 on 06/06/2016.  Normal studies included a CMP, ferritin, iron studies, folate, and TSH. TSH and free T4 were normal on 04/02/2017.    Diet is good. He eats meat 2 times a week. He denies any melena, hematochezia, or hematuria. Last colonoscopy was more than 10 years ago.  Guaiac cards x 3 were negative in 02/2016.  Symptomatically, he feels "very good".  He is eating well and has gained 5 pounds.  Exam is stable.  Hematocrit is 35.0 with a hemoglobin of 11.8.  PSA is 18.94.  Plan:   1.  Labs today:  CBC with diff, BMP, PSA, intrinsic antibody, anti-parietal antibody. 2.  Continue Casodex 50 mg daily. 3.  Lupron injection today. 4.  Continue monthly B12. 5.  Anticipate restaging studies in 11/2017. 6.  RTC in 3 months for MD assessment and labs (CBC with diff, BMP, PSA).   Honor Loh, NP  07/02/2017, 11:34 AM   I saw and evaluated the patient, participating in the key portions of the service and reviewing pertinent diagnostic studies and records.  I reviewed the nurse practitioner's note and agree with the findings and the plan.  The assessment and plan were discussed with the patient.  A few questions were asked by the patient and answered.   Honor Loh, NP 07/02/2017,11:34 AM

## 2017-07-03 LAB — INTRINSIC FACTOR ANTIBODIES: Intrinsic Factor: 1 AU/mL (ref 0.0–1.1)

## 2017-07-03 LAB — TESTOSTERONE: Testosterone: <3 ng/dL — ABNORMAL LOW (ref 264–916)

## 2017-07-03 LAB — PSA: Prostatic Specific Antigen: 18.94 ng/mL — ABNORMAL HIGH (ref 0.00–4.00)

## 2017-07-04 LAB — ANTI-PARIETAL ANTIBODY: Parietal Cell Antibody-IgG: 25.8 Units — ABNORMAL HIGH (ref 0.0–20.0)

## 2017-07-08 ENCOUNTER — Encounter: Payer: Self-pay | Admitting: Hematology and Oncology

## 2017-07-30 ENCOUNTER — Inpatient Hospital Stay: Payer: Medicare Other

## 2017-08-06 ENCOUNTER — Inpatient Hospital Stay: Payer: Medicare Other | Attending: Hematology and Oncology

## 2017-08-06 DIAGNOSIS — D519 Vitamin B12 deficiency anemia, unspecified: Secondary | ICD-10-CM | POA: Insufficient documentation

## 2017-08-06 DIAGNOSIS — C61 Malignant neoplasm of prostate: Secondary | ICD-10-CM | POA: Insufficient documentation

## 2017-08-06 DIAGNOSIS — Z79818 Long term (current) use of other agents affecting estrogen receptors and estrogen levels: Secondary | ICD-10-CM | POA: Insufficient documentation

## 2017-08-06 MED ORDER — CYANOCOBALAMIN 1000 MCG/ML IJ SOLN
1000.0000 ug | Freq: Once | INTRAMUSCULAR | Status: AC
  Administered 2017-08-06: 1000 ug via INTRAMUSCULAR

## 2017-08-27 DIAGNOSIS — H2513 Age-related nuclear cataract, bilateral: Secondary | ICD-10-CM | POA: Diagnosis not present

## 2017-09-03 ENCOUNTER — Encounter: Payer: Self-pay | Admitting: Nurse Practitioner

## 2017-09-03 ENCOUNTER — Ambulatory Visit (INDEPENDENT_AMBULATORY_CARE_PROVIDER_SITE_OTHER): Payer: Medicare Other | Admitting: Nurse Practitioner

## 2017-09-03 ENCOUNTER — Inpatient Hospital Stay: Payer: Medicare Other

## 2017-09-03 VITALS — BP 109/75 | HR 78 | Resp 16 | Ht 71.0 in | Wt 154.0 lb

## 2017-09-03 DIAGNOSIS — D519 Vitamin B12 deficiency anemia, unspecified: Secondary | ICD-10-CM

## 2017-09-03 DIAGNOSIS — C61 Malignant neoplasm of prostate: Secondary | ICD-10-CM | POA: Diagnosis not present

## 2017-09-03 NOTE — Progress Notes (Signed)
[facility] [place], [place] [id number]  Internal MEDICINE  Office Visit Note  Patient Name: Charles Flores  [id number]  [id number]  Date of Service: 09/03/2017  No chief complaint on file.   Patient is here for routine follow up visit. History of B12 anemia. Gets monthly injections at [facility]. Also treated for prostate cancer. PSA is monitored every 3 months per urology. Patient states that he is feeling well today. Has no concerns or complaints. He is due to have routine blood work drawn.     Pt is here for routine follow up.    Current Medication: Outpatient Encounter Medications as of 09/03/2017  Medication Sig  . bicalutamide (CASODEX) 50 MG tablet TAKE 1 TABLET (50 MG TOTAL) BY MOUTH DAILY.  Marland Kitchen leuprolide (LUPRON) 7.5 MG injection Inject 7.5 mg into the muscle once. Per dr Elnoria Howard urology  . polyethylene glycol (MIRALAX / GLYCOLAX) packet Take 3,350 packets by mouth continuous as needed.   No facility-administered encounter medications on file as of 09/03/2017.     Surgical History: Past Surgical History:  Procedure Laterality Date  . PROSTATE BIOPSY      Medical History: Past Medical History:  Diagnosis Date  . Anemia 12/24/2014  . Arthritis   . B12 deficiency anemia 01/01/2015  . Cataract   . Coronary artery disease   . GERD (gastroesophageal reflux disease)   . Hyperlipidemia   . Hyperlipidemia   . Prostate cancer ([place])   . Renal insufficiency 04/15/2017  . Thyroid nodule 03/14/2016    Family History: Family History  Problem Relation Age of Onset  . Diabetes Sister   . Stroke Sister   . Gastric cancer Sister   . Cancer Mother   . Breast cancer Mother   . Kidney disease Father     Social History   Socioeconomic History  . Marital status: Single    Spouse name: Not on file  . Number of children: Not on file  . Years of education: Not on file  . Highest education level: Not on file  Social Needs  . Financial resource strain: Not on  file  . Food insecurity - worry: Not on file  . Food insecurity - inability: Not on file  . Transportation needs - medical: Not on file  . Transportation needs - non-medical: Not on file  Occupational History  . Not on file  Tobacco Use  . Smoking status: Former Smoker    Packs/day: 0.50    Years: 15.00    Pack years: 7.50    Types: Cigarettes  . Smokeless tobacco: Never Used  . Tobacco comment: quit 40 years  Substance and Sexual Activity  . Alcohol use: No    Alcohol/week: 0.0 oz  . Drug use: No  . Sexual activity: Yes    Partners: Male  Other Topics Concern  . Not on file  Social History Narrative  . Not on file      Review of Systems  Constitutional: Negative for activity change, fatigue and fever.  HENT: Negative.   Eyes: Negative.   Respiratory: Negative for chest tightness, shortness of breath and wheezing.   Cardiovascular: Negative for chest pain and palpitations.  Gastrointestinal: Negative for constipation, diarrhea, nausea and vomiting.  Endocrine: Negative.   Genitourinary: Negative.        Treated for prostate cancer per urology   Musculoskeletal: Negative for arthralgias, back pain and myalgias.  Skin: Negative.   Neurological: Negative for weakness, light-headedness and headaches.  Hematological:  Chronic vitamin b12 deficiency.   Psychiatric/Behavioral: The patient is not nervous/anxious.     Today's Vitals   09/03/17 1004  BP: 109/75  Pulse: 78  Resp: 16  SpO2: 97%  Weight: 154 lb (69.9 kg)  Height: 5\' 11"  (1.803 m)    Physical Exam  Constitutional: He is oriented to person, place, and time. He appears well-developed and well-nourished.  HENT:  Head: Normocephalic and atraumatic.  Eyes: Pupils are equal, round, and reactive to light.  Neck: Normal range of motion. Neck supple. No JVD present. No thyromegaly present.  Cardiovascular: Normal rate, regular rhythm and normal heart sounds.  Pulmonary/Chest: Effort normal and breath  sounds normal. No respiratory distress. He has no wheezes.  Abdominal: Soft. Bowel sounds are normal. There is no tenderness.  Musculoskeletal: Normal range of motion.  Neurological: He is alert and oriented to person, place, and time.  Skin: Skin is warm and dry.  Psychiatric: He has a normal mood and affect.  Nursing note and vitals reviewed.   Assessment/Plan:    ICD-10-CM   1. Anemia due to vitamin B12 deficiency, unspecified B12 deficiency type D51.9   2. Prostate cancer ([place]) C61    1. Patient may continue monthly b12 injections. Receives these at [facility]. 2. Regular visits with Charles Flores, urology. Regular PSA monitoring and lupron injections as indicated.   Lab slip given to patient to have CBC, CMP, lipid panel, and thyroid panel drawn with next routine lab draw per urology.   Annual wellness visit should be in 6 months. Patient should follow up as needed.   General Counseling: Charles Flores understanding of the findings of todays visit and agrees with plan of treatment. I have discussed any further diagnostic evaluation that may be needed or ordered today. We also reviewed his medications today. he has been encouraged to call the office with any questions or concerns that should arise related to todays visit.  This patient was seen by Leretha Pol, FNP- C in Collaboration with Dr Lavera Guise as a part of collaborative care agreement    No orders of the defined types were placed in this encounter.   No orders of the defined types were placed in this encounter.   Time spent: 30 Minutes   Dr Lavera Guise Internal medicine

## 2017-09-05 ENCOUNTER — Inpatient Hospital Stay: Payer: Medicare Other | Attending: Hematology and Oncology

## 2017-09-05 DIAGNOSIS — D519 Vitamin B12 deficiency anemia, unspecified: Secondary | ICD-10-CM

## 2017-09-05 DIAGNOSIS — E538 Deficiency of other specified B group vitamins: Secondary | ICD-10-CM | POA: Diagnosis not present

## 2017-09-05 MED ORDER — LEUPROLIDE ACETATE (4 MONTH) 30 MG IM KIT: 30.0000 mg | PACK | Freq: Once | INTRAMUSCULAR | Status: DC

## 2017-09-05 MED ORDER — CYANOCOBALAMIN 1000 MCG/ML IJ SOLN
1000.0000 ug | Freq: Once | INTRAMUSCULAR | Status: AC
  Administered 2017-09-05: 1000 ug via INTRAMUSCULAR

## 2017-09-17 ENCOUNTER — Other Ambulatory Visit: Payer: Self-pay | Admitting: *Deleted

## 2017-09-17 MED ORDER — BICALUTAMIDE 50 MG PO TABS: ORAL_TABLET | ORAL | 0 refills | Status: DC

## 2017-10-01 ENCOUNTER — Inpatient Hospital Stay: Payer: Medicare Other

## 2017-10-01 ENCOUNTER — Encounter: Payer: Self-pay | Admitting: Hematology and Oncology

## 2017-10-01 ENCOUNTER — Inpatient Hospital Stay (HOSPITAL_BASED_OUTPATIENT_CLINIC_OR_DEPARTMENT_OTHER): Payer: Medicare Other | Admitting: Hematology and Oncology

## 2017-10-01 ENCOUNTER — Inpatient Hospital Stay: Payer: Medicare Other | Attending: Hematology and Oncology

## 2017-10-01 ENCOUNTER — Other Ambulatory Visit: Payer: Self-pay | Admitting: Hematology and Oncology

## 2017-10-01 VITALS — BP 147/80 | HR 77 | Temp 94.9°F | Resp 18 | Wt 154.0 lb

## 2017-10-01 DIAGNOSIS — C61 Malignant neoplasm of prostate: Secondary | ICD-10-CM | POA: Diagnosis not present

## 2017-10-01 DIAGNOSIS — D519 Vitamin B12 deficiency anemia, unspecified: Secondary | ICD-10-CM | POA: Diagnosis not present

## 2017-10-01 DIAGNOSIS — Z87891 Personal history of nicotine dependence: Secondary | ICD-10-CM | POA: Diagnosis not present

## 2017-10-01 DIAGNOSIS — Z7189 Other specified counseling: Secondary | ICD-10-CM

## 2017-10-01 DIAGNOSIS — I2510 Atherosclerotic heart disease of native coronary artery without angina pectoris: Secondary | ICD-10-CM | POA: Insufficient documentation

## 2017-10-01 DIAGNOSIS — Z5111 Encounter for antineoplastic chemotherapy: Secondary | ICD-10-CM | POA: Insufficient documentation

## 2017-10-01 DIAGNOSIS — E538 Deficiency of other specified B group vitamins: Secondary | ICD-10-CM | POA: Insufficient documentation

## 2017-10-01 DIAGNOSIS — D508 Other iron deficiency anemias: Secondary | ICD-10-CM

## 2017-10-01 LAB — COMPREHENSIVE METABOLIC PANEL
ALT: 8 U/L — ABNORMAL LOW (ref 17–63)
AST: 15 U/L (ref 15–41)
Albumin: 4.1 g/dL (ref 3.5–5.0)
Alkaline Phosphatase: 66 U/L (ref 38–126)
Anion gap: 3 — ABNORMAL LOW (ref 5–15)
BUN: 19 mg/dL (ref 6–20)
CO2: 25 mmol/L (ref 22–32)
Calcium: 9 mg/dL (ref 8.9–10.3)
Chloride: 106 mmol/L (ref 101–111)
Creatinine, Ser: 1.27 mg/dL — ABNORMAL HIGH (ref 0.61–1.24)
GFR calc Af Amer: 56 mL/min — ABNORMAL LOW (ref >60)
GFR calc non Af Amer: 49 mL/min — ABNORMAL LOW (ref >60)
Glucose, Bld: 103 mg/dL — ABNORMAL HIGH (ref 65–99)
Potassium: 4.2 mmol/L (ref 3.5–5.1)
Sodium: 134 mmol/L — ABNORMAL LOW (ref 135–145)
Total Bilirubin: 0.7 mg/dL (ref 0.3–1.2)
Total Protein: 7.2 g/dL (ref 6.5–8.1)

## 2017-10-01 LAB — CBC WITH DIFFERENTIAL/PLATELET
Basophils Absolute: 0 10*3/uL (ref 0–0.1)
Basophils Relative: 1 %
Eosinophils Absolute: 0.2 10*3/uL (ref 0–0.7)
Eosinophils Relative: 5 %
HCT: 35.3 % — ABNORMAL LOW (ref 40.0–52.0)
Hemoglobin: 11.9 g/dL — ABNORMAL LOW (ref 13.0–18.0)
Lymphocytes Relative: 31 %
Lymphs Abs: 1.1 10*3/uL (ref 1.0–3.6)
MCH: 32.9 pg (ref 26.0–34.0)
MCHC: 33.6 g/dL (ref 32.0–36.0)
MCV: 97.8 fL (ref 80.0–100.0)
Monocytes Absolute: 0.5 10*3/uL (ref 0.2–1.0)
Monocytes Relative: 13 %
Neutro Abs: 1.7 10*3/uL (ref 1.4–6.5)
Neutrophils Relative %: 50 %
Platelets: 186 10*3/uL (ref 150–440)
RBC: 3.61 MIL/uL — ABNORMAL LOW (ref 4.40–5.90)
RDW: 14 % (ref 11.5–14.5)
WBC: 3.5 10*3/uL — ABNORMAL LOW (ref 3.8–10.6)

## 2017-10-01 LAB — PSA: Prostatic Specific Antigen: 28.28 ng/mL — ABNORMAL HIGH (ref 0.00–4.00)

## 2017-10-01 MED ORDER — CYANOCOBALAMIN 1000 MCG/ML IJ SOLN
1000.0000 ug | Freq: Once | INTRAMUSCULAR | Status: AC
  Administered 2017-10-01: 1000 ug via INTRAMUSCULAR

## 2017-10-01 NOTE — Progress Notes (Signed)
Patient offers no complaints today. 

## 2017-10-01 NOTE — Progress Notes (Signed)
[facility] day:  10/01/2017    Chief Complaint: Charles Flores is an 84 y.o. male with prostate cancer and B12 deficiency who is seen for Lupron and 3 month assessment.  HPI: The patient was last seen in the medical oncology on 07/02/2017.  At that time, he felt "very good".  He had gained 5 pounds.  Exam was stable.  Hematocrit was 35.0 with a hemoglobin of 11.8.  PSA was 18.94 (stable to improved).  He continued Casodex.  He has continued monthly B12 (last 09/05/2017).  During the interim, patient has been doing well. He denies physical complaints today. Patient continues to eat well. He has gained 1 pound since his last visit. Patient denies urinary symptoms. He has experienced no B symptoms or interval infections.   Patient continues on Casodex without any significant side effects. Patient denies pain in the clinic today.    Past Medical History:  Diagnosis Date  . Anemia 12/24/2014  . Arthritis   . B12 deficiency anemia 01/01/2015  . Cataract   . Coronary artery disease   . GERD (gastroesophageal reflux disease)   . Hyperlipidemia   . Hyperlipidemia   . [place]state cancer ([place])   . Renal insufficiency 04/15/2017  . Thyroid nodule 03/14/2016    Past Surgical History:  Procedure Laterality Date  . PROSTATE BIOPSY      Family History  Problem Relation Age of Onset  . Diabetes Sister   . Stroke Sister   . Gastric cancer Sister   . Cancer Mother   . Breast cancer Mother   . Kidney disease Father     Social History:  reports that he has quit smoking. His smoking use included cigarettes. He has a 7.50 pack-year smoking history. he has never used smokeless tobacco. He reports that he does not drink alcohol or use drugs.  He lives in [place].  He is alone today.  Allergies: No Known Allergies  Current Outpatient Medications  Medication Sig Dispense Refill  . bicalutamide (CASODEX) 50 MG tablet TAKE 1 TABLET (50 MG TOTAL) BY MOUTH  DAILY. 90 tablet 0  . leuprolide (LUPRON) 7.5 MG injection Inject 7.5 mg into the muscle once. Per dr Elnoria Howard urology    . polyethylene glycol (MIRALAX / GLYCOLAX) packet Take 3,350 packets by mouth continuous as needed.  5   No current facility-administered medications for this visit.    ROS  GENERAL:  Feels "pretty good".  No fevers.  Weight up 1 pound since last visit.  PERFORMANCE STATUS (ECOG):  1 HEENT:  No visual changes, runny nose, sore throat, mouth sores or tenderness. Lungs: Shortness of breath with exertion.  No cough.  No hemoptysis. Cardiac:  No chest pain, palpitations, orthopnea, or PND. GI:  Eating well.  Trouble chewing.  No nausea, vomiting, diarrhea, constipation, melena or hematochezia. GU:  No urgency, frequency, dysuria, or hematuria. Musculoskeletal:  No back pain.  No joint pain.  No muscle tenderness. Extremities:  No pain or swelling. Skin:  No rashes or skin changes. Neuro:  No headache, numbness or weakness.  Sometimes balance or coordination issues. Endocrine:  No diabetes, thyroid issues.  Rare hot flashes. Psych:  No mood changes, depression or anxiety. Pain:  No focal pain. Review of systems:  All other systems reviewed and found to be negative.  Physical Exam Blood pressure (!) 147/80, pulse 77, temperature (!) 94.9 F (34.9 C), temperature source Tympanic, resp. rate 18, weight 154 lb (69.9 kg).  GENERAL:  Well developed, well nourished gentleman sitting comfortably in the exam room in no acute distress. MENTAL STATUS:  Alert and oriented to person, place and time. HEAD:  Alopecia.  Slight gray mustache.  Normocephalic, atraumatic, face symmetric, no Cushingoid features. EYES:  Brown eyes (prominent) with slight lid lag.  Pupils equal round and reactive to light and accomodation.  No conjunctivitis or scleral icterus. ENT:  Oropharynx clear without lesion. Dentures.  Tongue normal. Mucous membranes moist. RESPIRATORY:  Clear to auscultation without  rales, wheezes or rhonchi. CARDIOVASCULAR:  Regular rate and rhythm without murmur, rub or gallop. ABDOMEN:  Soft, non-tender, with active bowel sounds, and no hepatosplenomegaly.  No masses. SKIN:  No rashes, ulcers or lesions. EXTREMITIES: No edema, skin discoloration or tenderness.  No palpable cords. LYMPH NODES: No palpable cervical, supraclavicular, axillary or inguinal adenopathy  NEUROLOGICAL: Unremarkable. PSYCH:  Appropriate   Appointment on 10/01/2017  Component Date Value Ref Range Status  . WBC 10/01/2017 3.5* 3.8 - 10.6 K/uL Final  . RBC 10/01/2017 3.61* 4.40 - 5.90 MIL/uL Final  . Hemoglobin 10/01/2017 11.9* 13.0 - 18.0 g/dL Final  . HCT 10/01/2017 35.3* 40.0 - 52.0 % Final  . MCV 10/01/2017 97.8  80.0 - 100.0 fL Final  . MCH 10/01/2017 32.9  26.0 - 34.0 pg Final  . MCHC 10/01/2017 33.6  32.0 - 36.0 g/dL Final  . RDW 10/01/2017 14.0  11.5 - 14.5 % Final  . Platelets 10/01/2017 186  150 - 440 K/uL Final  . Neutrophils Relative % 10/01/2017 50  % Final  . Neutro Abs 10/01/2017 1.7  1.4 - 6.5 K/uL Final  . Lymphocytes Relative 10/01/2017 31  % Final  . Lymphs Abs 10/01/2017 1.1  1.0 - 3.6 K/uL Final  . Monocytes Relative 10/01/2017 13  % Final  . Monocytes Absolute 10/01/2017 0.5  0.2 - 1.0 K/uL Final  . Eosinophils Relative 10/01/2017 5  % Final  . Eosinophils Absolute 10/01/2017 0.2  0 - 0.7 K/uL Final  . Basophils Relative 10/01/2017 1  % Final  . Basophils Absolute 10/01/2017 0.0  0 - 0.1 K/uL Final   Performed at [facility], [address] [place], [place] [address]  . Sodium 10/01/2017 134* 135 - 145 mmol/L Final  . Potassium 10/01/2017 4.2  3.5 - 5.1 mmol/L Final  . Chloride 10/01/2017 106  101 - 111 mmol/L Final  . CO2 10/01/2017 25  22 - 32 mmol/L Final  . Glucose, Bld 10/01/2017 103* 65 - 99 mg/dL Final  . BUN 10/01/2017 19  6 - 20 mg/dL Final  . Creatinine, Ser 10/01/2017 1.27* 0.61 - 1.24 mg/dL Final  . Calcium 10/01/2017 9.0  8.9 - 10.3 mg/dL  Final  . Total Protein 10/01/2017 7.2  6.5 - 8.1 g/dL Final  . Albumin 10/01/2017 4.1  3.5 - 5.0 g/dL Final  . AST 10/01/2017 15  15 - 41 U/L Final  . ALT 10/01/2017 8* 17 - 63 U/L Final  . Alkaline Phosphatase 10/01/2017 66  38 - 126 U/L Final  . Total Bilirubin 10/01/2017 0.7  0.3 - 1.2 mg/dL Final  . GFR calc non Af Amer 10/01/2017 49* >60 mL/min Final  . GFR calc Af Amer 10/01/2017 56* >60 mL/min Final   Comment: (NOTE) The eGFR has been calculated using the CKD EPI equation. This calculation has not been validated in all clinical situations. eGFR's persistently <60 mL/min signify possible Chronic Kidney Disease.   . Anion gap 10/01/2017 3* 5 - 15  Final   Performed at [facility], [address] [place], [place] [address]    Assessment:  The patient is a 84 year old African American gentleman with prostate cancer and a rising PSA on Lupron.  Prostate cancer was discovered approximately 20 years secondary to an elevated PSA.  He underwent biopsy at [facility] (no report available). Initial PSA was 774.    Abdomen and pelvic CT on 01/05/2006 revealed an enlarged and heterogeneous prostate with mass effect on the base of the bladder.  There was no adenopathy.  Chest, abdomen, and pelvic CT on 03/15/2016 revealed moderate centrilobular emphysema.  There were mild lucent mottled appearance of the osseous structures (cannot exclude multiple myeloma).  There were scattered pulmonary nodules (4 mm or less in size).  There was a 2.7 cm low-attenuation left thyroid nodule.     Abdomen and pelvic CT on 12/07/2016 revealed no new or progressive findings.  There was no evidence of lymphadenopathy in the abdomen or pelvis. There was a stable appearance of heterogeneous/mottled bony mineralization.  Work-up on 03/17/2016 revealed no monoclonal protein.  SPEP revealed no monoclonal protein.  Kappa free light chains were 28.9, lambda free light chains 20.7 with a ratio of  1.40 (normal).  Immunoglobulins were normal (IgG 1391, IgA 282, IgM 29).  24 hour urine revealed no monoclonal protein.  Thyroid ultrasound on 03/22/2016 revealed a solitary 2.9 cm left lower pole nodule.  Thyroid biopsy on 04/14/2016 was benign (Bethesda category II) with abundant foamy macrophages and colloid. 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   He is on Lupron and Casodex.  He receives Lupron 30 mg every 4 months (last 07/02/2017).    PSA has been followed: 127.6 on 06/30/2014, 168.3 on 12/02/2014, 63.32 on 02/01/2015, 40.31 on 03/03/2015, 29.12 on 04/08/2015, 11.28 on 07/14/2015, 11.02 on 08/17/2015, 10.35 on 11/15/2015, 14.37 on 02/15/2016, 12.46 on 06/06/2016, 13.5 on 06/23/2016, 16.63 on 09/05/2016, 20.77 on 11/07/2016, 16.41 on 12/11/2016, 26.69 on 02/27/2017, 25.67 on 04/02/2017, 19.16 on 05/10/2017, 18.94 on 07/02/2017, and 28.28 on 10/01/2017.  He has a normocytic anemia. He was diagnosed with B12 deficiency on 12/24/2014. B12 was 169 (low).  He began B12 supplimentation on 02/01/2015 (last 09/05/2017).  Parietal cell antibody was positive on 07/02/2017.  Folate was 11 on 12/24/2014 and 12 on 06/06/2016.  Normal studies included a CMP, ferritin, iron studies, folate, and TSH. TSH and free T4 were normal on 04/02/2017.   Diet is good. He eats meat 2 times a week. He denies any melena, hematochezia, or hematuria. Last colonoscopy was more than 10 years ago.  Guaiac cards x 3 were negative in 02/2016.  Symptomatically, he feels "pretty good".  He denies pain.  He is eating well and has gained 1 pound since his last visit.  Exam is stable.  Hematocrit is 35.3 with a hemoglobin of 11.9.  PSA has fluctuated over the last 6 months.  Plan:   1.  Labs today:  CBC with diff, CMP, PSA. 2.  Discuss fluctuating PSA.  Discuss plan for reimaging. 3.   Continue Casodex 50 mg daily. 4.  Continue Lupron every 4 months (due 10/30/2017). 5.  B12 today and monthly. 6.  Abdomen and pelvic CT on 12/07/2017. 7.  Bone scan on 12/07/2017. 8.  RTC after staging studies for MD assessment, labs (CBC with diff, BMP, PSA), and review of imaging.  Honor Loh, NP  10/01/2017, 11:39 AM   I saw and evaluated the patient, participating in the key portions of the service and reviewing pertinent diagnostic studies and records.  I reviewed the nurse practitioner's note and agree with the findings and the plan.  The assessment and plan were discussed with the patient.  A few questions were asked by the patient and answered.   Lequita Asal, MD 10/01/2017,11:39 AM

## 2017-10-02 ENCOUNTER — Other Ambulatory Visit: Payer: Self-pay

## 2017-10-02 ENCOUNTER — Ambulatory Visit: Payer: Self-pay | Admitting: Hematology and Oncology

## 2017-10-29 ENCOUNTER — Ambulatory Visit: Payer: Self-pay

## 2017-10-30 ENCOUNTER — Inpatient Hospital Stay: Payer: Medicare Other

## 2017-11-02 ENCOUNTER — Inpatient Hospital Stay: Payer: Medicare Other | Attending: Hematology and Oncology

## 2017-11-02 ENCOUNTER — Inpatient Hospital Stay: Payer: Medicare Other

## 2017-11-02 DIAGNOSIS — D519 Vitamin B12 deficiency anemia, unspecified: Secondary | ICD-10-CM | POA: Insufficient documentation

## 2017-11-15 ENCOUNTER — Inpatient Hospital Stay: Payer: Medicare Other

## 2017-11-15 ENCOUNTER — Inpatient Hospital Stay: Payer: Medicare Other | Attending: Hematology and Oncology

## 2017-11-15 DIAGNOSIS — C61 Malignant neoplasm of prostate: Secondary | ICD-10-CM | POA: Diagnosis not present

## 2017-11-15 DIAGNOSIS — D519 Vitamin B12 deficiency anemia, unspecified: Secondary | ICD-10-CM | POA: Diagnosis not present

## 2017-11-15 LAB — CBC WITH DIFFERENTIAL/PLATELET
Basophils Absolute: 0 10*3/uL (ref 0–0.1)
Basophils Relative: 1 %
Eosinophils Absolute: 0.1 10*3/uL (ref 0–0.7)
Eosinophils Relative: 4 %
HCT: 35.7 % — ABNORMAL LOW (ref 40.0–52.0)
Hemoglobin: 12.3 g/dL — ABNORMAL LOW (ref 13.0–18.0)
Lymphocytes Relative: 26 %
Lymphs Abs: 1 10*3/uL (ref 1.0–3.6)
MCH: 33.8 pg (ref 26.0–34.0)
MCHC: 34.5 g/dL (ref 32.0–36.0)
MCV: 97.9 fL (ref 80.0–100.0)
Monocytes Absolute: 0.5 10*3/uL (ref 0.2–1.0)
Monocytes Relative: 12 %
Neutro Abs: 2.2 10*3/uL (ref 1.4–6.5)
Neutrophils Relative %: 57 %
Platelets: 183 10*3/uL (ref 150–440)
RBC: 3.65 MIL/uL — ABNORMAL LOW (ref 4.40–5.90)
RDW: 13.9 % (ref 11.5–14.5)
WBC: 3.8 10*3/uL (ref 3.8–10.6)

## 2017-11-15 LAB — BASIC METABOLIC PANEL
Anion gap: 8 (ref 5–15)
BUN: 16 mg/dL (ref 6–20)
CO2: 26 mmol/L (ref 22–32)
Calcium: 9.2 mg/dL (ref 8.9–10.3)
Chloride: 104 mmol/L (ref 101–111)
Creatinine, Ser: 1.15 mg/dL (ref 0.61–1.24)
GFR calc Af Amer: >60 mL/min (ref >60)
GFR calc non Af Amer: 55 mL/min — ABNORMAL LOW (ref >60)
Glucose, Bld: 98 mg/dL (ref 65–99)
Potassium: 4.4 mmol/L (ref 3.5–5.1)
Sodium: 138 mmol/L (ref 135–145)

## 2017-11-15 LAB — PSA: Prostatic Specific Antigen: 28.18 ng/mL — ABNORMAL HIGH (ref 0.00–4.00)

## 2017-11-15 MED ORDER — CYANOCOBALAMIN 1000 MCG/ML IJ SOLN
1000.0000 ug | Freq: Once | INTRAMUSCULAR | Status: AC
  Administered 2017-11-15: 1000 ug via INTRAMUSCULAR

## 2017-11-26 ENCOUNTER — Inpatient Hospital Stay: Payer: Medicare Other | Attending: Hematology and Oncology

## 2017-11-26 DIAGNOSIS — Z5111 Encounter for antineoplastic chemotherapy: Secondary | ICD-10-CM | POA: Insufficient documentation

## 2017-11-26 DIAGNOSIS — C61 Malignant neoplasm of prostate: Secondary | ICD-10-CM | POA: Insufficient documentation

## 2017-11-27 ENCOUNTER — Encounter
Admission: RE | Admit: 2017-11-27 | Discharge: 2017-11-27 | Disposition: A | Discharge status: Home / Self Care | Payer: Medicare Other | Source: Ambulatory Visit | Attending: Urgent Care | Admitting: Urgent Care

## 2017-11-27 ENCOUNTER — Ambulatory Visit
Admission: RE | Admit: 2017-11-27 | Discharge: 2017-11-27 | Disposition: A | Discharge status: Home / Self Care | Payer: Medicare Other | Source: Ambulatory Visit | Attending: Urgent Care | Admitting: Urgent Care

## 2017-11-27 DIAGNOSIS — C61 Malignant neoplasm of prostate: Secondary | ICD-10-CM | POA: Diagnosis not present

## 2017-11-27 DIAGNOSIS — I700 Atherosclerosis of aorta: Secondary | ICD-10-CM | POA: Diagnosis not present

## 2017-11-27 DIAGNOSIS — R911 Solitary pulmonary nodule: Secondary | ICD-10-CM | POA: Insufficient documentation

## 2017-11-27 MED ORDER — TECHNETIUM TC 99M MEDRONATE IV KIT
25.0000 | PACK | Freq: Once | INTRAVENOUS | Status: AC | PRN
Start: 2017-11-27 — End: 2017-11-27
  Administered 2017-11-27: 23.851 via INTRAVENOUS

## 2017-11-28 ENCOUNTER — Ambulatory Visit (HOSPITAL_COMMUNITY): Payer: Medicare Other

## 2017-11-29 ENCOUNTER — Ambulatory Visit (HOSPITAL_COMMUNITY): Payer: Medicare Other

## 2017-11-29 ENCOUNTER — Other Ambulatory Visit (HOSPITAL_COMMUNITY): Payer: Self-pay

## 2017-12-06 ENCOUNTER — Other Ambulatory Visit: Payer: Self-pay

## 2017-12-06 ENCOUNTER — Other Ambulatory Visit: Payer: Self-pay | Admitting: Hematology and Oncology

## 2017-12-06 ENCOUNTER — Inpatient Hospital Stay: Payer: Medicare Other

## 2017-12-06 ENCOUNTER — Inpatient Hospital Stay (HOSPITAL_BASED_OUTPATIENT_CLINIC_OR_DEPARTMENT_OTHER): Payer: Medicare Other | Admitting: Hematology and Oncology

## 2017-12-06 ENCOUNTER — Encounter: Payer: Self-pay | Admitting: Hematology and Oncology

## 2017-12-06 ENCOUNTER — Inpatient Hospital Stay: Payer: Medicare Other | Attending: Hematology and Oncology

## 2017-12-06 VITALS — BP 122/77 | HR 78 | Temp 95.3°F | Resp 18 | Wt 154.7 lb

## 2017-12-06 DIAGNOSIS — Z5111 Encounter for antineoplastic chemotherapy: Secondary | ICD-10-CM | POA: Diagnosis not present

## 2017-12-06 DIAGNOSIS — I2510 Atherosclerotic heart disease of native coronary artery without angina pectoris: Secondary | ICD-10-CM | POA: Insufficient documentation

## 2017-12-06 DIAGNOSIS — E538 Deficiency of other specified B group vitamins: Secondary | ICD-10-CM

## 2017-12-06 DIAGNOSIS — D519 Vitamin B12 deficiency anemia, unspecified: Secondary | ICD-10-CM

## 2017-12-06 DIAGNOSIS — C61 Malignant neoplasm of prostate: Secondary | ICD-10-CM | POA: Insufficient documentation

## 2017-12-06 DIAGNOSIS — Z87891 Personal history of nicotine dependence: Secondary | ICD-10-CM | POA: Diagnosis not present

## 2017-12-06 DIAGNOSIS — R918 Other nonspecific abnormal finding of lung field: Secondary | ICD-10-CM | POA: Insufficient documentation

## 2017-12-06 DIAGNOSIS — Z7189 Other specified counseling: Secondary | ICD-10-CM

## 2017-12-06 DIAGNOSIS — R911 Solitary pulmonary nodule: Secondary | ICD-10-CM | POA: Insufficient documentation

## 2017-12-06 LAB — BASIC METABOLIC PANEL
Anion gap: 7 (ref 5–15)
BUN: 22 mg/dL — ABNORMAL HIGH (ref 6–20)
CO2: 22 mmol/L (ref 22–32)
Calcium: 9.6 mg/dL (ref 8.9–10.3)
Chloride: 107 mmol/L (ref 101–111)
Creatinine, Ser: 1.39 mg/dL — ABNORMAL HIGH (ref 0.61–1.24)
GFR calc Af Amer: 51 mL/min — ABNORMAL LOW (ref >60)
GFR calc non Af Amer: 44 mL/min — ABNORMAL LOW (ref >60)
Glucose, Bld: 95 mg/dL (ref 65–99)
Potassium: 4.2 mmol/L (ref 3.5–5.1)
Sodium: 136 mmol/L (ref 135–145)

## 2017-12-06 LAB — PSA: Prostatic Specific Antigen: 30.23 ng/mL — ABNORMAL HIGH (ref 0.00–4.00)

## 2017-12-06 LAB — CBC WITH DIFFERENTIAL/PLATELET
Basophils Absolute: 0 10*3/uL (ref 0–0.1)
Basophils Relative: 1 %
Eosinophils Absolute: 0.1 10*3/uL (ref 0–0.7)
Eosinophils Relative: 3 %
HCT: 34.2 % — ABNORMAL LOW (ref 40.0–52.0)
Hemoglobin: 11.9 g/dL — ABNORMAL LOW (ref 13.0–18.0)
Lymphocytes Relative: 23 %
Lymphs Abs: 0.9 10*3/uL — ABNORMAL LOW (ref 1.0–3.6)
MCH: 33.8 pg (ref 26.0–34.0)
MCHC: 34.8 g/dL (ref 32.0–36.0)
MCV: 97.2 fL (ref 80.0–100.0)
Monocytes Absolute: 0.5 10*3/uL (ref 0.2–1.0)
Monocytes Relative: 11 %
Neutro Abs: 2.5 10*3/uL (ref 1.4–6.5)
Neutrophils Relative %: 62 %
Platelets: 194 10*3/uL (ref 150–440)
RBC: 3.52 MIL/uL — ABNORMAL LOW (ref 4.40–5.90)
RDW: 13.7 % (ref 11.5–14.5)
WBC: 4.1 10*3/uL (ref 3.8–10.6)

## 2017-12-06 MED ORDER — LEUPROLIDE ACETATE (4 MONTH) 30 MG IM KIT
30.0000 mg | PACK | Freq: Once | INTRAMUSCULAR | Status: AC
  Administered 2017-12-06: 30 mg via INTRAMUSCULAR
  Filled 2017-12-06: qty 30

## 2017-12-06 NOTE — Progress Notes (Signed)
[facility] day:  12/06/2017    Chief Complaint: Charles Flores is an 84 y.o. male with prostate cancer and B12 deficiency who is seen for review of interval restaging studies, Lupron, and 2 month assessment.  HPI: The patient was last seen in the medical oncology on 10/01/2017.  At that time, he felt "pretty good".  He denied pain.  He was eating well and has gained 1 pound since his last visit.  Exam was stable.  Hematocrit was 35.3 with a hemoglobin of 11.9.  PSA was fluctuating.  He continued Casodex.  He received B12.  He was scheduled to receive Lupron on 10/30/2017 (did not receive).  He received B12 on 11/15/2017.  Abdomen and pelvic CT on 11/27/2017 was stable with no evidence of adenopathy within the abdomen and pelvis.  There were no suspicious bone lesions.  There was a stable 3 mm RLL nodule.  Bone scan on 11/27/2017 revealed no evidence of osseous metastatic disease.  Symptomatically, patient is doing well. He continues to have stable exertional dyspnea. He has no acute concerns. Patient denies any B symptoms or interval infections. Patient is eating well. His weight remains stable.   Patient denies pain in the clinic today.    Past Medical History:  Diagnosis Date  . Anemia 12/24/2014  . Arthritis   . B12 deficiency anemia 01/01/2015  . Cataract   . Coronary artery disease   . GERD (gastroesophageal reflux disease)   . Hyperlipidemia   . Hyperlipidemia   . Prostate cancer ([place])   . Renal insufficiency 04/15/2017  . Thyroid nodule 03/14/2016    Past Surgical History:  Procedure Laterality Date  . PROSTATE BIOPSY      Family History  Problem Relation Age of Onset  . Diabetes Sister   . Stroke Sister   . Gastric cancer Sister   . Cancer Mother   . Breast cancer Mother   . Kidney disease Father     Social History:  reports that he has quit smoking. His smoking use included cigarettes. He has a 7.50 pack-year smoking  history. He has never used smokeless tobacco. He reports that he does not drink alcohol or use drugs.  He lives in [place].  He is alone today.  Allergies: No Known Allergies  Current Outpatient Medications  Medication Sig Dispense Refill  . bicalutamide (CASODEX) 50 MG tablet TAKE 1 TABLET (50 MG TOTAL) BY MOUTH DAILY. 90 tablet 0  . leuprolide (LUPRON) 7.5 MG injection Inject 7.5 mg into the muscle once. Per dr Elnoria Howard urology    . polyethylene glycol (MIRALAX / GLYCOLAX) packet Take 3,350 packets by mouth continuous as needed.  5   No current facility-administered medications for this visit.    ROS  GENERAL:  Feels "fairly well".  No fevers, sweats or weight loss.  Weight stable. PERFORMANCE STATUS (ECOG):  1 HEENT:  No visual changes, runny nose, sore throat, mouth sores or tenderness. Lungs:  Shortness of breath with exertion.  No cough.  No hemoptysis. Cardiac:  No chest pain, palpitations, orthopnea, or PND. GI:  Eating well.  Trouble chewing.  No nausea, vomiting, diarrhea, constipation, melena or hematochezia. GU:  No urgency, frequency, dysuria, or hematuria. Musculoskeletal:  No back pain.  No joint pain.  No muscle tenderness. Extremities:  No pain or swelling. Skin:  No rashes or skin changes. Neuro:  No headache, numbness or weakness, balance or coordination issues. Endocrine:  No diabetes, thyroid issues, hot  flashes or night sweats. Psych:  No mood changes, depression or anxiety. Pain:  No focal pain. Review of systems:  All other systems reviewed and found to be negative.   Physical Exam Blood pressure 122/77, pulse 78, temperature (!) 95.3 F (35.2 C), temperature source Tympanic, resp. rate 18, weight 154 lb 11.2 oz (70.2 kg).  GENERAL:  Well developed, well nourished, gentleman sitting comfortably in the exam room in no acute distress. MENTAL STATUS:  Alert and oriented to person, place and time. HEAD:  Alopecia.  Slight gray mustache.  ormocephalic, atraumatic,  face symmetric, no Cushingoid features. EYES:  Prominent brown eyes with slight lig lag.  Pupils equal round and reactive to light and accomodation.  No conjunctivitis or scleral icterus. ENT:  Oropharynx clear without lesion.  Tongue normal.  Dentures.  Mucous membranes moist.  RESPIRATORY:  Clear to auscultation without rales, wheezes or rhonchi. CARDIOVASCULAR:  Regular rate and rhythm without murmur, rub or gallop. ABDOMEN:  Soft, non-tender, with active bowel sounds, and no hepatosplenomegaly.  No masses. SKIN:  No rashes, ulcers or lesions. EXTREMITIES: No edema, no skin discoloration or tenderness.  No palpable cords. LYMPH NODES: No palpable cervical, supraclavicular, axillary or inguinal adenopathy  NEUROLOGICAL: Unremarkable. PSYCH:  Appropriate.    Orders Only on 12/06/2017  Component Date Value Ref Range Status  . Sodium 12/06/2017 136  135 - 145 mmol/L Final  . Potassium 12/06/2017 4.2  3.5 - 5.1 mmol/L Final  . Chloride 12/06/2017 107  101 - 111 mmol/L Final  . CO2 12/06/2017 22  22 - 32 mmol/L Final  . Glucose, Bld 12/06/2017 95  65 - 99 mg/dL Final  . BUN 12/06/2017 22* 6 - 20 mg/dL Final  . Creatinine, Ser 12/06/2017 1.39* 0.61 - 1.24 mg/dL Final  . Calcium 12/06/2017 9.6  8.9 - 10.3 mg/dL Final  . GFR calc non Af Amer 12/06/2017 44* >60 mL/min Final  . GFR calc Af Amer 12/06/2017 51* >60 mL/min Final   Comment: (NOTE) The eGFR has been calculated using the CKD EPI equation. This calculation has not been validated in all clinical situations. eGFR's persistently <60 mL/min signify possible Chronic Kidney Disease.   Georgiann Hahn gap 12/06/2017 7  5 - 15 Final   Performed at [facility], [address] [place], [place] [address]  . WBC 12/06/2017 4.1  3.8 - 10.6 K/uL Final  . RBC 12/06/2017 3.52* 4.40 - 5.90 MIL/uL Final  . Hemoglobin 12/06/2017 11.9* 13.0 - 18.0 g/dL Final  . HCT 12/06/2017 34.2* 40.0 - 52.0 % Final  . MCV 12/06/2017 97.2  80.0 - 100.0 fL Final   . MCH 12/06/2017 33.8  26.0 - 34.0 pg Final  . MCHC 12/06/2017 34.8  32.0 - 36.0 g/dL Final  . RDW 12/06/2017 13.7  11.5 - 14.5 % Final  . Platelets 12/06/2017 194  150 - 440 K/uL Final  . Neutrophils Relative % 12/06/2017 62  % Final  . Neutro Abs 12/06/2017 2.5  1.4 - 6.5 K/uL Final  . Lymphocytes Relative 12/06/2017 23  % Final  . Lymphs Abs 12/06/2017 0.9* 1.0 - 3.6 K/uL Final  . Monocytes Relative 12/06/2017 11  % Final  . Monocytes Absolute 12/06/2017 0.5  0.2 - 1.0 K/uL Final  . Eosinophils Relative 12/06/2017 3  % Final  . Eosinophils Absolute 12/06/2017 0.1  0 - 0.7 K/uL Final  . Basophils Relative 12/06/2017 1  % Final  . Basophils Absolute 12/06/2017 0.0  0 - 0.1 K/uL Final  Performed at [facility], [address] [place], [place] [address]    Assessment:  The patient is a 84 year old African American gentleman with prostate cancer and a rising PSA on Lupron.  Prostate cancer was discovered approximately 20 years secondary to an elevated PSA.  He underwent biopsy at [facility] (no report available). Initial PSA was 774.    Abdomen and pelvic CT on 01/05/2006 revealed an enlarged and heterogeneous prostate with mass effect on the base of the bladder.  There was no adenopathy.  Chest, abdomen, and pelvic CT on 03/15/2016 revealed moderate centrilobular emphysema.  There were mild lucent mottled appearance of the osseous structures (cannot exclude multiple myeloma).  There were scattered pulmonary nodules (4 mm or less in size).  There was a 2.7 cm low-attenuation left thyroid nodule.     Abdomen and pelvic CT on 12/07/2016 revealed no new or progressive findings.  There was no evidence of lymphadenopathy in the abdomen or pelvis. There was a stable appearance of heterogeneous/mottled bony mineralization.  Abdomen and pelvic CT on 11/27/2017 was stable with no evidence of adenopathy within the abdomen and pelvis.  There were no suspicious bone lesions.   There was a stable 3 mm RLL nodule.  Work-up on 03/17/2016 revealed no monoclonal protein.  SPEP revealed no monoclonal protein.  Kappa free light chains were 28.9, lambda free light chains 20.7 with a ratio of 1.40 (normal).  Immunoglobulins were normal (IgG 1391, IgA 282, IgM 29).  24 hour urine revealed no monoclonal protein.  Thyroid ultrasound on 03/22/2016 revealed a solitary 2.9 cm left lower pole nodule.  Thyroid biopsy on 04/14/2016 was benign (Bethesda category II) with abundant foamy macrophages and colloid. The cytologic findings were compatible with a cystic colloid nodule  Bone scan on 04/08/2014, 12/29/2014, 02/23/2016, 12/07/2016, and 11/27/2017 revealed no evidence of metastatic disease.   He is on Lupron and Casodex.  He receives Lupron 30 mg every 4 months (last 07/02/2017).    PSA has been followed: 127.6 on 06/30/2014, 168.3 on 12/02/2014, 63.32 on 02/01/2015, 40.31 on 03/03/2015, 29.12 on 04/08/2015, 11.28 on 07/14/2015, 11.02 on 08/17/2015, 10.35 on 11/15/2015, 14.37 on 02/15/2016, 12.46 on 06/06/2016, 13.5 on 06/23/2016, 16.63 on 09/05/2016, 20.77 on 11/07/2016, 16.41 on 12/11/2016, 26.69 on 02/27/2017, 25.67 on 04/02/2017, 19.16 on 05/10/2017, 18.94 on 07/02/2017, 28.28 on 10/01/2017, and 30.23 on 12/06/2017.  He has a normocytic anemia. He was diagnosed with B12 deficiency on 12/24/2014. B12 was 169 (low).  He began B12 supplimentation on 02/01/2015 (last 11/15/2017).  Parietal cell antibody was positive on 07/02/2017.  Folate was 11 on 12/24/2014 and 12 on 06/06/2016.  Normal studies included a CMP, ferritin, iron studies, folate, and TSH. TSH and free T4 were normal on 04/02/2017.   Diet is good. He eats meat 2 times a week. He denies any melena, hematochezia, or hematuria. Last colonoscopy was more than 10 years ago.  Guaiac cards x 3 were negative in 02/2016.  Symptomatically, he feels "good".  He denies pain.  He is eating well.  Weight is stable since his last  visit.  Exam is unremarkable.  Hematocrit is 34.2 with a hemoglobin of 11.9.  PSA pending.   Plan:   1.  Labs today:  CBC with diff, BMP, PSA. 2.  Discuss bone scan- no evidence of metastatic osseous disease. 3.  Discuss abdomen and pelvic CT- no adenopathy or suspicious bone lesions.  RLL nodule stable.   4.  Discuss plan for follow-up  non-contrast chest CT.  5.  Continue Casodex 50 mg daily. 6.  Continue Lupron every 4 months. Overdue. Will given Lupron today.   7.  Continue monthly B12 (next due 12/13/2017). 8.  RTC in 3 months  for MD assessment and labs (CBC with diff, BMP, PSA).    Honor Loh, NP  12/06/2017, 11:01 AM   I saw and evaluated the patient, participating in the key portions of the service and reviewing pertinent diagnostic studies and records.  I reviewed the nurse practitioner's note and agree with the findings and the plan.  The assessment and plan were discussed with the patient.  A few questions were asked by the patient and answered.   Lequita Asal, MD  12/06/2017,11:01 AM

## 2017-12-06 NOTE — Progress Notes (Signed)
Here for follow up. Pt stated " Im doing fair "

## 2017-12-21 ENCOUNTER — Ambulatory Visit: Payer: Self-pay | Admitting: Nurse Practitioner

## 2017-12-24 ENCOUNTER — Inpatient Hospital Stay: Payer: Medicare Other | Attending: Hematology and Oncology

## 2017-12-30 ENCOUNTER — Other Ambulatory Visit: Payer: Self-pay | Admitting: Hematology and Oncology

## 2018-01-04 ENCOUNTER — Other Ambulatory Visit: Payer: Self-pay | Admitting: Hematology and Oncology

## 2018-01-08 ENCOUNTER — Other Ambulatory Visit: Payer: Self-pay | Admitting: Hematology and Oncology

## 2018-01-17 ENCOUNTER — Ambulatory Visit: Payer: Medicare Other | Admitting: Nurse Practitioner

## 2018-01-17 ENCOUNTER — Encounter: Payer: Self-pay | Admitting: Nurse Practitioner

## 2018-01-17 VITALS — BP 96/68 | HR 93 | Resp 16 | Ht 71.0 in | Wt 152.0 lb

## 2018-01-17 DIAGNOSIS — D519 Vitamin B12 deficiency anemia, unspecified: Secondary | ICD-10-CM | POA: Diagnosis not present

## 2018-01-17 DIAGNOSIS — R634 Abnormal weight loss: Secondary | ICD-10-CM

## 2018-01-17 DIAGNOSIS — I950 Idiopathic hypotension: Secondary | ICD-10-CM

## 2018-01-17 DIAGNOSIS — R0602 Shortness of breath: Secondary | ICD-10-CM

## 2018-01-17 DIAGNOSIS — R55 Syncope and collapse: Secondary | ICD-10-CM | POA: Diagnosis not present

## 2018-01-17 NOTE — Progress Notes (Signed)
[facility] [place], [place] [address]  Internal MEDICINE  Office Visit Note  Patient Name: Charles Flores  [id number]  [id number]  Date of Service: 01/18/2018  Chief Complaint  Patient presents with  . Dizziness    The patient c/o dizziness and mild shortness of breath. This is intermittent. Worse when he is getting up after being seated for some time. Denies chest pain or chest pressure. Has no headache. Has not noted blood in stool or urine. States that his appetite is good and he drinks a lot of water.    Pt is here for routine follow up.    Current Medication: Outpatient Encounter Medications as of 01/17/2018  Medication Sig  . bicalutamide (CASODEX) 50 MG tablet TAKE 1 TABLET BY MOUTH EVERY DAY  . leuprolide (LUPRON) 7.5 MG injection Inject 7.5 mg into the muscle once. Per dr Elnoria Howard urology  . polyethylene glycol (MIRALAX / GLYCOLAX) packet Take 3,350 packets by mouth continuous as needed.   No facility-administered encounter medications on file as of 01/17/2018.     Surgical History: Past Surgical History:  Procedure Laterality Date  . PROSTATE BIOPSY      Medical History: Past Medical History:  Diagnosis Date  . Anemia 12/24/2014  . Arthritis   . B12 deficiency anemia 01/01/2015  . Cataract   . Coronary artery disease   . GERD (gastroesophageal reflux disease)   . Hyperlipidemia   . Hyperlipidemia   . Prostate cancer ([place])   . Renal insufficiency 04/15/2017  . Thyroid nodule 03/14/2016    Family History: Family History  Problem Relation Age of Onset  . Diabetes Sister   . Stroke Sister   . Gastric cancer Sister   . Cancer Mother   . Breast cancer Mother   . Kidney disease Father     Social History   Socioeconomic History  . Marital status: Single    Spouse name: Not on file  . Number of children: Not on file  . Years of education: Not on file  . Highest education level: Not on file  Occupational History  . Not on file   Social Needs  . Financial resource strain: Not on file  . Food insecurity:    Worry: Not on file    Inability: Not on file  . Transportation needs:    Medical: Not on file    Non-medical: Not on file  Tobacco Use  . Smoking status: Former Smoker    Packs/day: 0.50    Years: 15.00    Pack years: 7.50    Types: Cigarettes  . Smokeless tobacco: Never Used  . Tobacco comment: quit 40 years  Substance and Sexual Activity  . Alcohol use: No    Alcohol/week: 0.0 oz  . Drug use: No  . Sexual activity: Yes    Partners: Male  Lifestyle  . Physical activity:    Days per week: Not on file    Minutes per session: Not on file  . Stress: Not on file  Relationships  . Social connections:    Talks on phone: Not on file    Gets together: Not on file    Attends religious service: Not on file    Active member of club or organization: Not on file    Attends meetings of clubs or organizations: Not on file    Relationship status: Not on file  . Intimate partner violence:    Fear of current or ex partner: Not on file  Emotionally abused: Not on file    Physically abused: Not on file    Forced sexual activity: Not on file  Other Topics Concern  . Not on file  Social History Narrative  . Not on file      Review of Systems  Constitutional: Positive for fatigue. Negative for activity change, chills and unexpected weight change.  HENT: Negative for congestion, postnasal drip, rhinorrhea, sneezing and sore throat.   Eyes: Negative.  Negative for redness.  Respiratory: Positive for shortness of breath. Negative for cough and chest tightness.   Cardiovascular: Negative for chest pain and palpitations.       Low blood pressure  Gastrointestinal: Negative for abdominal pain, constipation, diarrhea, nausea and vomiting.  Endocrine: Negative for cold intolerance, heat intolerance, polydipsia and polyphagia.  Genitourinary: Negative for dysuria and frequency.  Musculoskeletal: Negative for  arthralgias, back pain, joint swelling and neck pain.  Skin: Negative for rash.  Allergic/Immunologic: Negative for environmental allergies.  Neurological: Positive for dizziness and weakness. Negative for tremors, numbness and headaches.  Hematological: Negative for adenopathy. Does not bruise/bleed easily.  Psychiatric/Behavioral: Negative for behavioral problems (Depression), sleep disturbance and suicidal ideas. The patient is not nervous/anxious.     Vital Signs: BP 96/68 (BP Location: Left Arm, Cuff Size: Normal)   Pulse 93   Resp 16   Ht 5\' 11"  (1.803 m)   Wt 152 lb (68.9 kg)   SpO2 99%   BMI 21.20 kg/m    Physical Exam  Constitutional: He is oriented to person, place, and time. He appears well-developed and well-nourished. No distress.  HENT:  Head: Normocephalic and atraumatic.  Nose: Nose normal.  Mouth/Throat: Oropharynx is clear and moist. No oropharyngeal exudate.  Eyes: Pupils are equal, round, and reactive to light. Conjunctivae and EOM are normal.  Neck: Normal range of motion. Neck supple. No JVD present. No tracheal deviation present. No thyromegaly present.  Cardiovascular: Normal rate and normal heart sounds. Exam reveals no gallop and no friction rub.  No murmur heard. Mild irregular heart rhythm. ECG indicates sinus rhythm with left axis deviation. Was able to compare this with ECG done 2016 and no change was evident.   Pulmonary/Chest: Effort normal. No respiratory distress. He has no wheezes. He has no rales. He exhibits no tenderness.  Abdominal: Soft. Bowel sounds are normal.  Musculoskeletal: Normal range of motion.  Lymphadenopathy:    He has no cervical adenopathy.  Neurological: He is alert and oriented to person, place, and time. No cranial nerve deficit.  Skin: Skin is warm and dry. Capillary refill takes less than 2 seconds. He is not diaphoretic.  Psychiatric: He has a normal mood and affect. His behavior is normal. Judgment and thought content  normal.  Nursing note and vitals reviewed.  Assessment/Plan:  1. Idiopathic hypotension Upon arrival into office, patient had twovery low blood pressure readings. ECG in the office showing no acute changes. Will get echocardiogram and carotid doppler studies for further evaluation.   2. Near syncope - ECHOCARDIOGRAM COMPLETE; Future - US Carotid Duplex Bilateral; Future  3. SOB (shortness of breath) - EKG 12-Lead - Comprehensive metabolic panel - ECHOCARDIOGRAM COMPLETE; Future  4. Anemia due to vitamin B12 deficiency, unspecified B12 deficiency type The patient treated for b12 deficiency per cancer center. Will check CBC  5. Weight loss - TSH  General Counseling: Bebe Shaggy understanding of the findings of todays visit and agrees with plan of treatment. I have discussed any further diagnostic evaluation that may be  needed or ordered today. We also reviewed his medications today. he has been encouraged to call the office with any questions or concerns that should arise related to todays visit.   This patient was seen by Leretha Pol, FNP- C in Collaboration with Dr Lavera Guise as a part of collaborative care agreement    Orders Placed This Encounter  Procedures  . US Carotid Duplex Bilateral  . Comprehensive metabolic panel  . TSH  . EKG 12-Lead  . ECHOCARDIOGRAM COMPLETE    Time spent: 80 Minutes          Dr Lavera Guise Internal medicine

## 2018-01-18 DIAGNOSIS — R0602 Shortness of breath: Secondary | ICD-10-CM | POA: Insufficient documentation

## 2018-01-18 DIAGNOSIS — R55 Syncope and collapse: Secondary | ICD-10-CM | POA: Insufficient documentation

## 2018-01-18 DIAGNOSIS — I950 Idiopathic hypotension: Secondary | ICD-10-CM | POA: Insufficient documentation

## 2018-01-21 ENCOUNTER — Inpatient Hospital Stay: Payer: Medicare Other | Attending: Hematology and Oncology

## 2018-01-21 DIAGNOSIS — E538 Deficiency of other specified B group vitamins: Secondary | ICD-10-CM | POA: Diagnosis not present

## 2018-01-21 DIAGNOSIS — D519 Vitamin B12 deficiency anemia, unspecified: Secondary | ICD-10-CM

## 2018-01-21 MED ORDER — CYANOCOBALAMIN 1000 MCG/ML IJ SOLN
1000.0000 ug | Freq: Once | INTRAMUSCULAR | Status: AC
  Administered 2018-01-21: 1000 ug via INTRAMUSCULAR

## 2018-02-04 ENCOUNTER — Telehealth: Payer: Self-pay | Admitting: Nurse Practitioner

## 2018-02-04 NOTE — Telephone Encounter (Signed)
Patient called and states that he could not get his labs drawn from [facility] because he owes them money . Patient states that he is going go to [facility] to see if they can draw his labs

## 2018-02-08 ENCOUNTER — Ambulatory Visit: Payer: Medicare Other

## 2018-02-08 DIAGNOSIS — R55 Syncope and collapse: Secondary | ICD-10-CM

## 2018-02-08 DIAGNOSIS — R0602 Shortness of breath: Secondary | ICD-10-CM

## 2018-02-15 ENCOUNTER — Ambulatory Visit (INDEPENDENT_AMBULATORY_CARE_PROVIDER_SITE_OTHER): Payer: Medicare Other

## 2018-02-15 DIAGNOSIS — R55 Syncope and collapse: Secondary | ICD-10-CM | POA: Diagnosis not present

## 2018-02-18 ENCOUNTER — Inpatient Hospital Stay: Payer: Medicare Other | Attending: Hematology and Oncology

## 2018-02-18 DIAGNOSIS — E538 Deficiency of other specified B group vitamins: Secondary | ICD-10-CM | POA: Insufficient documentation

## 2018-02-18 DIAGNOSIS — C61 Malignant neoplasm of prostate: Secondary | ICD-10-CM | POA: Insufficient documentation

## 2018-02-18 DIAGNOSIS — Z79899 Other long term (current) drug therapy: Secondary | ICD-10-CM | POA: Insufficient documentation

## 2018-02-18 NOTE — Procedures (Signed)
[place], [place] [address]  DATE OF SERVICE: February 15, 2018  CAROTID DOPPLER INTERPRETATION:  Bilateral Carotid Ultrsasound and Color Doppler Examination was performed. The RIGHT CCA shows  no plaque in the vessel. The LEFT CCA shows  no plaque in the vessel. There was  no intimal thickening noted in the RIGHT carotid artery. There was  no intimal thickening in the LEFT carotid artery.  The RIGHT CCA shows peak systolic velocity of  43OO per second. The end diastolic velocity is  87NZ per second on the RIGHT side. The RIGHT ICA shows peak systolic velocity of  75 per second. RIGHT sided ICA end diastolic velocity is  97KQ per second. The RIGHT ECA shows a peak systolic velocity of  20UO per second. The ICA/CCA ratio is calculated to be  0.98. This suggests  Less than 50% stenosis. The Vertebral Artery shows  antegrade flow.  The LEFT CCA shows peak systolic velocity of  15IF per second. The end diastolic velocity is  53PH per second on the LEFT side. The LEFT ICA shows peak systolic velocity of  43EXM second. LEFT sided ICA end diastolic velocity is  14JW per second. The LEFT ECA shows a peak systolic velocity of  92HV per second. The ICA/CCA ratio is calculated to be  0.96. This suggests  Less than 50% stenosis. The Vertebral Artery shows  antegrade flow.   Impression:    The RIGHT CAROTID shows  Less than 50% stenosis. The LEFT CAROTID shows  Less than 50% stenosis.  There is  no plaque formation noted on the LEFT and  no on the RIGHT  side. Consider a repeat Carotid doppler if clinical situation and symptoms warrant in 6-12 months. Patient should be encouraged to change lifestyles such as smoking cessation, regular exercise and dietary modification. Use of statins in the right clinical setting and ASA is encouraged.  Allyne Gee, MD [facility] Pulmonary Critical Care Medicine

## 2018-02-26 DIAGNOSIS — H40003 Preglaucoma, unspecified, bilateral: Secondary | ICD-10-CM | POA: Diagnosis not present

## 2018-02-26 DIAGNOSIS — H2513 Age-related nuclear cataract, bilateral: Secondary | ICD-10-CM | POA: Diagnosis not present

## 2018-03-05 ENCOUNTER — Ambulatory Visit: Payer: Self-pay | Admitting: Urgent Care

## 2018-03-05 ENCOUNTER — Other Ambulatory Visit: Payer: Self-pay

## 2018-03-10 NOTE — Progress Notes (Deleted)
[facility] day:  03/10/2018    Chief Complaint: Charles Flores is an 84 y.o. male with prostate cancer and B12 deficiency who is seen for  3 month assessment.  HPI: The patient was last seen in the medical oncology on 12/06/2017.  At that time, patient was doing well. He notes continued exertional dyspnea. He denied any acute concerns. Eating well; weight stable. Exam was unremarkable.  Hematocrit 34.2 and hemoglobin  11.9.  PSA elevated at 30.23. He continued on daily Casodex. Patient was given Lupron injection in clinic.   Patient scheduled to return to the clinic monthly for parenteral B12 supplementation. Patient has only returned to the clinic x 1 (01/21/2018) for his injection. Prior to that, patient's last injection was on 11/15/2017.   He was seen on 01/17/2018 by his PCP Charles Flores, [place]). Notes reviewed. Patient with complaints of vertiginous symptoms and shortness of breath. He was HYPOtensive as compared to his baseline. EKG showed SR at a rate of 79; no evidence of ST segment elevation or depression. Patient was scheduled for carotid doppler study and an echocardiogram.  Echocardiogram on 02/13/2018 demonstrated an EF of 62%. There was mild to moderate TR and mild MR noted. Pressures consistent with borderline PAH.   Carotid doppler on 02/15/2018 demonstrated less than 50% stenosis bilaterally.   In the interim,   Past Medical History:  Diagnosis Date  . Anemia 12/24/2014  . Arthritis   . B12 deficiency anemia 01/01/2015  . Cataract   . Coronary artery disease   . GERD (gastroesophageal reflux disease)   . Hyperlipidemia   . Hyperlipidemia   . Prostate cancer ([place])   . Renal insufficiency 04/15/2017  . Thyroid nodule 03/14/2016    Past Surgical History:  Procedure Laterality Date  . PROSTATE BIOPSY      Family History  Problem Relation Age of Onset  . Diabetes Sister   . Stroke Sister   . Gastric cancer Sister   . Cancer Mother    . Breast cancer Mother   . Kidney disease Father     Social History:  reports that he has quit smoking. His smoking use included cigarettes. He has a 7.50 pack-year smoking history. He has never used smokeless tobacco. He reports that he does not drink alcohol or use drugs.  He lives in [place].  He is alone today.  Allergies: No Known Allergies  Current Outpatient Medications  Medication Sig Dispense Refill  . bicalutamide (CASODEX) 50 MG tablet TAKE 1 TABLET BY MOUTH EVERY DAY 90 tablet 0  . leuprolide (LUPRON) 7.5 MG injection Inject 7.5 mg into the muscle once. Per dr Elnoria Howard urology    . polyethylene glycol (MIRALAX / GLYCOLAX) packet Take 3,350 packets by mouth continuous as needed.  5   No current facility-administered medications for this visit.     Review of Systems  Constitutional: Negative for diaphoresis, fever, malaise/fatigue and weight loss.  HENT: Negative for nosebleeds and sore throat.        Edentulous. Dentures  Eyes: Negative.   Respiratory: Positive for shortness of breath (exertional). Negative for cough, hemoptysis and sputum production.   Cardiovascular: Negative for chest pain, palpitations, orthopnea, leg swelling and PND.  Gastrointestinal: Negative for abdominal pain, blood in stool, constipation, diarrhea, melena, nausea and vomiting.  Genitourinary: Negative for dysuria, frequency, hematuria and urgency.  Musculoskeletal: Negative for back pain, falls, joint pain and myalgias.  Skin: Negative for itching and rash.  Neurological: Positive for  dizziness. Negative for tremors, weakness and headaches.  Endo/Heme/Allergies: Does not bruise/bleed easily.  Psychiatric/Behavioral: Negative for depression, memory loss and suicidal ideas. The patient is not nervous/anxious and does not have insomnia.   All other systems reviewed and are negative.   Performance status (ECOG): 1 - Symptomatic but completely ambulatory  Vital Signs There were no vitals taken  for this visit.  Physical Exam  Constitutional: He is oriented to person, place, and time and well-developed, well-nourished, and in no distress.  HENT:  Head: Normocephalic and atraumatic.  Eyes: Pupils are equal, round, and reactive to light. EOM are normal. No scleral icterus.  Brown eyes. Slight ptosis.   Neck: Normal range of motion. Neck supple. No tracheal deviation present. No thyromegaly present.  Cardiovascular: Normal rate, regular rhythm and normal heart sounds. Exam reveals no gallop and no friction rub.  No murmur heard. Pulmonary/Chest: Effort normal and breath sounds normal. No respiratory distress. He has no wheezes. He has no rales.  Abdominal: Soft. Bowel sounds are normal. He exhibits no distension. There is no tenderness.  Musculoskeletal: Normal range of motion. He exhibits no edema or tenderness.  Lymphadenopathy:    He has no cervical adenopathy.    He has no axillary adenopathy.       Right: No inguinal and no supraclavicular adenopathy present.       Left: No inguinal and no supraclavicular adenopathy present.  Neurological: He is alert and oriented to person, place, and time.  Skin: Skin is warm and dry. No rash noted. No erythema.  Psychiatric: Mood, affect and judgment normal.  Nursing note and vitals reviewed.   No visits with results within 3 Day(s) from this visit.  Latest known visit with results is:  Orders Only on 12/06/2017  Component Date Value Ref Range Status  . Prostatic Specific Antigen 12/06/2017 30.23* 0.00 - 4.00 ng/mL Final   Comment: (NOTE) While PSA levels of <=4.0 ng/ml are reported as reference range, some men with levels below 4.0 ng/ml can have prostate cancer and many men with PSA above 4.0 ng/ml do not have prostate cancer.  Other tests such as free PSA, age specific reference ranges, PSA velocity and PSA doubling time may be helpful especially in men less than 75 years old. Performed at [facility], [place] [address] [place], [place] [address]   . Sodium 12/06/2017 136  135 - 145 mmol/L Final  . Potassium 12/06/2017 4.2  3.5 - 5.1 mmol/L Final  . Chloride 12/06/2017 107  101 - 111 mmol/L Final  . CO2 12/06/2017 22  22 - 32 mmol/L Final  . Glucose, Bld 12/06/2017 95  65 - 99 mg/dL Final  . BUN 12/06/2017 22* 6 - 20 mg/dL Final  . Creatinine, Ser 12/06/2017 1.39* 0.61 - 1.24 mg/dL Final  . Calcium 12/06/2017 9.6  8.9 - 10.3 mg/dL Final  . GFR calc non Af Amer 12/06/2017 44* >60 mL/min Final  . GFR calc Af Amer 12/06/2017 51* >60 mL/min Final   Comment: (NOTE) The eGFR has been calculated using the CKD EPI equation. This calculation has not been validated in all clinical situations. eGFR's persistently <60 mL/min signify possible Chronic Kidney Disease.   Georgiann Hahn gap 12/06/2017 7  5 - 15 Final   Performed at [facility], [address] [place], [place] [address]  . WBC 12/06/2017 4.1  3.8 - 10.6 K/uL Final  . RBC 12/06/2017 3.52* 4.40 - 5.90 MIL/uL Final  . Hemoglobin 12/06/2017 11.9*  13.0 - 18.0 g/dL Final  . HCT 12/06/2017 34.2* 40.0 - 52.0 % Final  . MCV 12/06/2017 97.2  80.0 - 100.0 fL Final  . MCH 12/06/2017 33.8  26.0 - 34.0 pg Final  . MCHC 12/06/2017 34.8  32.0 - 36.0 g/dL Final  . RDW 12/06/2017 13.7  11.5 - 14.5 % Final  . Platelets 12/06/2017 194  150 - 440 K/uL Final  . Neutrophils Relative % 12/06/2017 62  % Final  . Neutro Abs 12/06/2017 2.5  1.4 - 6.5 K/uL Final  . Lymphocytes Relative 12/06/2017 23  % Final  . Lymphs Abs 12/06/2017 0.9* 1.0 - 3.6 K/uL Final  . Monocytes Relative 12/06/2017 11  % Final  . Monocytes Absolute 12/06/2017 0.5  0.2 - 1.0 K/uL Final  . Eosinophils Relative 12/06/2017 3  % Final  . Eosinophils Absolute 12/06/2017 0.1  0 - 0.7 K/uL Final  . Basophils Relative 12/06/2017 1  % Final  . Basophils Absolute 12/06/2017 0.0  0 - 0.1 K/uL Final   Performed at [facility], [address] [place], [place] [address]    Assessment:  The patient  is a 83 year old African American gentleman with prostate cancer and a rising PSA on Lupron.  Prostate cancer was discovered approximately 20 years secondary to an elevated PSA.  He underwent biopsy at [facility] (no report available). Initial PSA was 774.    Abdomen and pelvic CT on 01/05/2006 revealed an enlarged and heterogeneous prostate with mass effect on the base of the bladder.  There was no adenopathy.  Chest, abdomen, and pelvic CT on 03/15/2016 revealed moderate centrilobular emphysema.  There were mild lucent mottled appearance of the osseous structures (cannot exclude multiple myeloma).  There were scattered pulmonary nodules (4 mm or less in size).  There was a 2.7 cm low-attenuation left thyroid nodule.     Abdomen and pelvic CT on 12/07/2016 revealed no new or progressive findings.  There was no evidence of lymphadenopathy in the abdomen or pelvis. There was a stable appearance of heterogeneous/mottled bony mineralization.  Abdomen and pelvic CT on 11/27/2017 was stable with no evidence of adenopathy within the abdomen and pelvis.  There were no suspicious bone lesions.  There was a stable 3 mm RLL nodule.  Work-up on 03/17/2016 revealed no monoclonal protein.  SPEP revealed no monoclonal protein.  Kappa free light chains were 28.9, lambda free light chains 20.7 with a ratio of 1.40 (normal).  Immunoglobulins were normal (IgG 1391, IgA 282, IgM 29).  24 hour urine revealed no monoclonal protein.  Thyroid ultrasound on 03/22/2016 revealed a solitary 2.9 cm left lower pole nodule.  Thyroid biopsy on 04/14/2016 was benign (Bethesda category II) with abundant foamy macrophages and colloid. The cytologic findings were compatible with a cystic colloid nodule  Bone scan on 04/08/2014, 12/29/2014, 02/23/2016, 12/07/2016, and 11/27/2017 revealed no evidence of metastatic disease.   He is on Lupron and Casodex.  He receives Lupron 30 mg every 4 months (last 12/16/2017).     PSA has been followed: 127.6 on 06/30/2014, 168.3 on 12/02/2014, 63.32 on 02/01/2015, 40.31 on 03/03/2015, 29.12 on 04/08/2015, 11.28 on 07/14/2015, 11.02 on 08/17/2015, 10.35 on 11/15/2015, 14.37 on 02/15/2016, 12.46 on 06/06/2016, 13.5 on 06/23/2016, 16.63 on 09/05/2016, 20.77 on 11/07/2016, 16.41 on 12/11/2016, 26.69 on 02/27/2017, 25.67 on 04/02/2017, 19.16 on 05/10/2017, 18.94 on 07/02/2017, 28.28 on 10/01/2017, and 30.23 on 12/06/2017.  He has a normocytic anemia. He was diagnosed with B12 deficiency on  12/24/2014. B12 was 169 (low).  He began B12 supplimentation on 02/01/2015 (last 11/15/2017).  Parietal cell antibody was positive on 07/02/2017.  Folate was 11 on 12/24/2014 and 12 on 06/06/2016.  Normal studies included a CMP, ferritin, iron studies, folate, and TSH. TSH and free T4 were normal on 04/02/2017.   Diet is good. He eats meat 2 times a week. He denies any melena, hematochezia, or hematuria. Last colonoscopy was more than 10 years ago.  Guaiac cards x 3 were negative in 02/2016.  Echocardiogram on 02/13/2018 demonstrated an EF of 62%. There was mild to moderate TR and mild MR noted. Pressures consistent with borderline PAH. Carotid doppler study on 02/15/2018 demonstrated less than 50% stenosis bilaterally.   Symptomatically,  he feels "good".  He denies pain.  He is eating well.  Weight is stable since his last visit.  Exam is unremarkable.  Hematocrit is 34.2 with a hemoglobin of 11.9.  PSA pending.   Plan:   1. Labs today: CBC with diff, BMP, PSA. 2. Review interval diagnostic studies:  EKG showed SR with a leftward axis at a rate of 73. No ST segment changes.   Carotid doppler demonstrated less than 50% stenosis bilaterally.  Echocardiogram demonstrated and EFof 62%. There was mild to moderate TR and mild MR noted. Pressures consistent with borderline PAH.   3. Discuss prostate cancer diagnosis. PSA XXX today. Continue Casodex 50 mg daily. 4. Continue Lupron every  4 months (due 04/08/2018). 5. B12 today, then monthly. 6. RTC in 3 months  for MD assessment and labs (CBC with diff, BMP, PSA).   Honor Loh, NP  03/10/2018, 6:43 PM

## 2018-03-11 ENCOUNTER — Inpatient Hospital Stay: Payer: Medicare Other

## 2018-03-11 ENCOUNTER — Inpatient Hospital Stay: Payer: Medicare Other | Admitting: Urgent Care

## 2018-03-14 ENCOUNTER — Inpatient Hospital Stay (HOSPITAL_BASED_OUTPATIENT_CLINIC_OR_DEPARTMENT_OTHER): Payer: Medicare Other | Admitting: Hematology and Oncology

## 2018-03-14 ENCOUNTER — Ambulatory Visit
Admission: RE | Admit: 2018-03-14 | Discharge: 2018-03-14 | Disposition: A | Discharge status: Home / Self Care | Payer: Medicare Other | Source: Ambulatory Visit | Attending: Urgent Care | Admitting: Urgent Care

## 2018-03-14 ENCOUNTER — Other Ambulatory Visit: Payer: Self-pay

## 2018-03-14 ENCOUNTER — Encounter: Payer: Self-pay | Admitting: Hematology and Oncology

## 2018-03-14 ENCOUNTER — Inpatient Hospital Stay: Payer: Medicare Other

## 2018-03-14 VITALS — BP 133/79 | HR 70 | Temp 96.8°F | Resp 18 | Wt 143.1 lb

## 2018-03-14 DIAGNOSIS — R634 Abnormal weight loss: Secondary | ICD-10-CM

## 2018-03-14 DIAGNOSIS — R1907 Generalized intra-abdominal and pelvic swelling, mass and lump: Secondary | ICD-10-CM

## 2018-03-14 DIAGNOSIS — R109 Unspecified abdominal pain: Secondary | ICD-10-CM | POA: Diagnosis not present

## 2018-03-14 DIAGNOSIS — C61 Malignant neoplasm of prostate: Secondary | ICD-10-CM

## 2018-03-14 DIAGNOSIS — R1909 Other intra-abdominal and pelvic swelling, mass and lump: Secondary | ICD-10-CM | POA: Insufficient documentation

## 2018-03-14 DIAGNOSIS — R1900 Intra-abdominal and pelvic swelling, mass and lump, unspecified site: Secondary | ICD-10-CM | POA: Diagnosis present

## 2018-03-14 DIAGNOSIS — E538 Deficiency of other specified B group vitamins: Secondary | ICD-10-CM

## 2018-03-14 DIAGNOSIS — R5383 Other fatigue: Secondary | ICD-10-CM | POA: Diagnosis not present

## 2018-03-14 DIAGNOSIS — D519 Vitamin B12 deficiency anemia, unspecified: Secondary | ICD-10-CM

## 2018-03-14 DIAGNOSIS — Z79899 Other long term (current) drug therapy: Secondary | ICD-10-CM | POA: Diagnosis not present

## 2018-03-14 LAB — CBC WITH DIFFERENTIAL/PLATELET
Basophils Absolute: 0 10*3/uL (ref 0–0.1)
Basophils Relative: 1 %
Eosinophils Absolute: 0.2 10*3/uL (ref 0–0.7)
Eosinophils Relative: 5 %
HCT: 35.1 % — ABNORMAL LOW (ref 40.0–52.0)
Hemoglobin: 11.9 g/dL — ABNORMAL LOW (ref 13.0–18.0)
Lymphocytes Relative: 30 %
Lymphs Abs: 1 10*3/uL (ref 1.0–3.6)
MCH: 33.7 pg (ref 26.0–34.0)
MCHC: 34.1 g/dL (ref 32.0–36.0)
MCV: 99 fL (ref 80.0–100.0)
Monocytes Absolute: 0.4 10*3/uL (ref 0.2–1.0)
Monocytes Relative: 12 %
Neutro Abs: 1.7 10*3/uL (ref 1.4–6.5)
Neutrophils Relative %: 52 %
Platelets: 186 10*3/uL (ref 150–440)
RBC: 3.54 MIL/uL — ABNORMAL LOW (ref 4.40–5.90)
RDW: 13.6 % (ref 11.5–14.5)
WBC: 3.3 10*3/uL — ABNORMAL LOW (ref 3.8–10.6)

## 2018-03-14 LAB — T4, FREE: Free T4: 0.93 ng/dL (ref 0.82–1.77)

## 2018-03-14 LAB — BASIC METABOLIC PANEL
Anion gap: 10 (ref 5–15)
BUN: 26 mg/dL — ABNORMAL HIGH (ref 8–23)
CO2: 22 mmol/L (ref 22–32)
Calcium: 9.5 mg/dL (ref 8.9–10.3)
Chloride: 106 mmol/L (ref 98–111)
Creatinine, Ser: 1.51 mg/dL — ABNORMAL HIGH (ref 0.61–1.24)
GFR calc Af Amer: 45 mL/min — ABNORMAL LOW (ref >60)
GFR calc non Af Amer: 39 mL/min — ABNORMAL LOW (ref >60)
Glucose, Bld: 105 mg/dL — ABNORMAL HIGH (ref 70–99)
Potassium: 4.5 mmol/L (ref 3.5–5.1)
Sodium: 138 mmol/L (ref 135–145)

## 2018-03-14 LAB — PSA: Prostatic Specific Antigen: 34.56 ng/mL — ABNORMAL HIGH (ref 0.00–4.00)

## 2018-03-14 LAB — TSH: TSH: 3 u[IU]/mL (ref 0.350–4.500)

## 2018-03-14 MED ORDER — CYANOCOBALAMIN 1000 MCG/ML IJ SOLN
1000.0000 ug | Freq: Once | INTRAMUSCULAR | Status: AC
Start: 2018-03-14 — End: 2018-03-14
  Administered 2018-03-14: 1000 ug via INTRAMUSCULAR
  Filled 2018-03-14: qty 1

## 2018-03-14 NOTE — Progress Notes (Signed)
Pt in for follow up states "not been feeling too well lately".

## 2018-03-14 NOTE — Progress Notes (Signed)
Charles Flores is an 84 y.o. male with prostate cancer and B12 deficiency who is seen for  3 month assessment.  HPI: The patient was last seen in the medical oncology on 12/06/2017.  At that time, patient was doing well. He notes continued exertional dyspnea. He denied any acute concerns. Eating well; weight stable. Exam was unremarkable.  Hematocrit 34.2 and hemoglobin  11.9.  PSA elevated at 30.23. He continued on daily Casodex. Patient was given Lupron injection in clinic.   Patient scheduled to return to the clinic monthly for parenteral B12 supplementation. Patient has only returned to the clinic x 1 (01/21/2018) for his injection.  Prior to that, patient's last injection was on 11/15/2017.   He was seen on 01/17/2018 by his PCP [name], [place]).  Notes reviewed. Patient with complaints of vertigo symptoms and shortness of breath. He was HYPOtensive as compared to his baseline. EKG showed SR at a rate of 79; no evidence of ST segment elevation or depression. Patient was scheduled for carotid doppler study and an echocardiogram.  Echocardiogram on 02/13/2018 demonstrated an EF of 62%. There was mild to moderate TR and mild MR noted. Pressures consistent with borderline PAH.   Carotid doppler on 02/15/2018 demonstrated less than 50% stenosis bilaterally.   In the interim, patient continues to experience significant vertigo symptoms bilaterally. He complains of pain in his LEFT temple at times. He denies other symptoms. Patient denies bleeding; no hematochezia, melena, or gross hematuria. He has no significant urinary symptoms. Patient denies that he has experienced any B symptoms. He denies any interval infections.   Patient advises that he maintains an adequate appetite. He is eating "ok". Weight today is 143 lb 2 oz (64.9 kg), which compared to his last visit to the clinic, represents an 11 pound loss.    Patient denies pain in the clinic today.   Past Medical History:  Diagnosis Date  . Anemia 12/24/2014  . Arthritis   . B12 deficiency anemia 01/01/2015  . Cataract   . Coronary artery disease   . GERD (gastroesophageal reflux disease)   . Hyperlipidemia   . Hyperlipidemia   . Prostate cancer ([place])   . Renal insufficiency 04/15/2017  . Thyroid nodule 03/14/2016    Past Surgical History:  Procedure Laterality Date  . PROSTATE BIOPSY      Family History  Problem Relation Age of Onset  . Diabetes Sister   . Stroke Sister   . Gastric cancer Sister   . Cancer Mother   . Breast cancer Mother   . Kidney disease Father     Social History:  reports that he has quit smoking. His smoking use included cigarettes. He has a 7.50 pack-year smoking history. He has never used smokeless tobacco. He reports that he does not drink alcohol or use drugs.  He lives in [place].  He states that his car broke down and cousin died and thus was unable to get to clinic.  He is alone today.  Allergies: No Known Allergies  Current Outpatient Medications  Medication Sig Dispense Refill  . bicalutamide (CASODEX) 50 MG tablet TAKE 1 TABLET BY MOUTH EVERY DAY 90 tablet 0  . leuprolide (LUPRON) 7.5 MG injection Inject 7.5 mg into the muscle once. Per dr Elnoria Howard urology    . polyethylene glycol (MIRALAX / GLYCOLAX) packet Take 3,350 packets by mouth continuous as needed.  5   No  current facility-administered medications for this visit.     Review of Systems  Constitutional: Positive for weight loss (11 pounds). Negative for diaphoresis, fever and malaise/fatigue.       Feels "ok".  HENT: Negative for congestion, nosebleeds, sinus pain and sore throat.   Eyes: Negative.  Negative for blurred vision, double vision, photophobia and pain.  Respiratory: Positive for shortness of breath (exertional). Negative for cough, hemoptysis and sputum production.   Cardiovascular: Negative.  Negative for chest pain,  palpitations, orthopnea, leg swelling and PND.  Gastrointestinal: Negative.  Negative for abdominal pain, blood in stool, constipation, diarrhea, melena, nausea and vomiting.       Eating well.  Genitourinary: Negative.  Negative for dysuria, frequency, hematuria and urgency.  Musculoskeletal: Negative.  Negative for back pain, falls, joint pain and myalgias.  Skin: Negative.  Negative for itching and rash.  Neurological: Positive for dizziness. Negative for tremors, weakness and headaches.  Endo/Heme/Allergies: Does not bruise/bleed easily.  Psychiatric/Behavioral: Negative for depression, memory loss and suicidal ideas. The patient is not nervous/anxious and does not have insomnia.   All other systems reviewed and are negative.   Performance status (ECOG): 1 - Symptomatic but completely ambulatory  Vital Signs BP 133/79 (BP Location: Left Arm, Patient Position: Sitting)   Pulse 70   Temp (!) 96.8 F (36 C) (Tympanic)   Resp 18   Wt 143 lb 2 oz (64.9 kg)   BMI 19.96 kg/m   Physical Exam  Constitutional: He is oriented to person, place, and time.  Thin gentleman  HENT:  Head: Normocephalic and atraumatic.  Dentures  Eyes: Pupils are equal, round, and reactive to light. EOM are normal. No scleral icterus.  Brown eyes. Slight ptosis.   Neck: Normal range of motion. Neck supple. No tracheal deviation present. No thyromegaly present.  Cardiovascular: Normal rate, regular rhythm and normal heart sounds. Exam reveals no gallop and no friction rub.  No murmur heard. Pulmonary/Chest: Effort normal and breath sounds normal. No respiratory distress. He has no wheezes. He has no rales.  Abdominal: Soft. Bowel sounds are normal. He exhibits no distension. There is no tenderness.  Right hard flank mass (8 cm) in area of prior lipoma.  Musculoskeletal: Normal range of motion. He exhibits no edema or tenderness.  Lymphadenopathy:    He has no cervical adenopathy.    He has no axillary  adenopathy.       Right: No inguinal and no supraclavicular adenopathy present.       Left: No inguinal and no supraclavicular adenopathy present.  Neurological: He is alert and oriented to person, place, and time.  Skin: Skin is warm and dry. No rash noted. No erythema.  Psychiatric: Mood, affect and judgment normal.  Nursing note and vitals reviewed.   Appointment on 03/14/2018  Component Date Value Ref Range Status  . Sodium 03/14/2018 138  135 - 145 mmol/L Final  . Potassium 03/14/2018 4.5  3.5 - 5.1 mmol/L Final  . Chloride 03/14/2018 106  98 - 111 mmol/L Final  . CO2 03/14/2018 22  22 - 32 mmol/L Final  . Glucose, Bld 03/14/2018 105* 70 - 99 mg/dL Final  . BUN 03/14/2018 26* 8 - 23 mg/dL Final  . Creatinine, Ser 03/14/2018 1.51* 0.61 - 1.24 mg/dL Final  . Calcium 03/14/2018 9.5  8.9 - 10.3 mg/dL Final  . GFR calc non Af Amer 03/14/2018 39* >60 mL/min Final  . GFR calc Af Amer 03/14/2018 45* >60 mL/min Final   Comment: (  NOTE) The eGFR has been calculated using the CKD EPI equation. This calculation has not been validated in all clinical situations. eGFR's persistently <60 mL/min signify possible Chronic Kidney Disease.   Georgiann Hahn gap 03/14/2018 10  5 - 15 Final   Performed at [facility], [address] [place], [place] [address]  . WBC 03/14/2018 3.3* 3.8 - 10.6 K/uL Final  . RBC 03/14/2018 3.54* 4.40 - 5.90 MIL/uL Final  . Hemoglobin 03/14/2018 11.9* 13.0 - 18.0 g/dL Final  . HCT 03/14/2018 35.1* 40.0 - 52.0 % Final  . MCV 03/14/2018 99.0  80.0 - 100.0 fL Final  . MCH 03/14/2018 33.7  26.0 - 34.0 pg Final  . MCHC 03/14/2018 34.1  32.0 - 36.0 g/dL Final  . RDW 03/14/2018 13.6  11.5 - 14.5 % Final  . Platelets 03/14/2018 186  150 - 440 K/uL Final  . Neutrophils Relative % 03/14/2018 52  % Final  . Neutro Abs 03/14/2018 1.7  1.4 - 6.5 K/uL Final  . Lymphocytes Relative 03/14/2018 30  % Final  . Lymphs Abs 03/14/2018 1.0  1.0 - 3.6 K/uL Final  . Monocytes Relative  03/14/2018 12  % Final  . Monocytes Absolute 03/14/2018 0.4  0.2 - 1.0 K/uL Final  . Eosinophils Relative 03/14/2018 5  % Final  . Eosinophils Absolute 03/14/2018 0.2  0 - 0.7 K/uL Final  . Basophils Relative 03/14/2018 1  % Final  . Basophils Absolute 03/14/2018 0.0  0 - 0.1 K/uL Final   Performed at [facility], [address] [place], [place] [address]    Assessment:  The patient is a 84 year old African American gentleman with prostate cancer and a rising PSA on Lupron.  Prostate cancer was discovered approximately 20 years secondary to an elevated PSA.  He underwent biopsy at [facility] (no report available). Initial PSA was 774.    Abdomen and pelvic CT on 01/05/2006 revealed an enlarged and heterogeneous prostate with mass effect on the base of the bladder.  There was no adenopathy.  Chest, abdomen, and pelvic CT on 03/15/2016 revealed moderate centrilobular emphysema.  There were mild lucent mottled appearance of the osseous structures (cannot exclude multiple myeloma).  There were scattered pulmonary nodules (4 mm or less in size).  There was a 2.7 cm low-attenuation left thyroid nodule.     Abdomen and pelvic CT on 12/07/2016 revealed no new or progressive findings.  There was no evidence of lymphadenopathy in the abdomen or pelvis. There was a stable appearance of heterogeneous/mottled bony mineralization.  Abdomen and pelvic CT on 11/27/2017 was stable with no evidence of adenopathy within the abdomen and pelvis.  There were no suspicious bone lesions.  There was a stable 3 mm RLL nodule.  Work-up on 03/17/2016 revealed no monoclonal protein.  SPEP revealed no monoclonal protein.  Kappa free light chains were 28.9, lambda free light chains 20.7 with a ratio of 1.40 (normal).  Immunoglobulins were normal (IgG 1391, IgA 282, IgM 29).  24 hour urine revealed no monoclonal protein.  Thyroid ultrasound on 03/22/2016 revealed a solitary 2.9 cm left lower pole  nodule.  Thyroid biopsy on 04/14/2016 was benign (Bethesda category II) with abundant foamy macrophages and colloid. The cytologic findings were compatible with a cystic colloid nodule  Bone scan on 04/08/2014, 12/29/2014, 02/23/2016, 12/07/2016, and 11/27/2017 revealed no evidence of metastatic disease.   He is on Lupron and Casodex.  He receives Lupron 30 mg every 4 months (last  12/06/2017).    PSA has been followed: 127.6 on 06/30/2014, 168.3 on 12/02/2014, 63.32 on 02/01/2015, 40.31 on 03/03/2015, 29.12 on 04/08/2015, 11.28 on 07/14/2015, 11.02 on 08/17/2015, 10.35 on 11/15/2015, 14.37 on 02/15/2016, 12.46 on 06/06/2016, 13.5 on 06/23/2016, 16.63 on 09/05/2016, 20.77 on 11/07/2016, 16.41 on 12/11/2016, 26.69 on 02/27/2017, 25.67 on 04/02/2017, 19.16 on 05/10/2017, 18.94 on 07/02/2017, 28.28 on 10/01/2017, 30.23 on 12/06/2017, and 34.56 on 03/14/2018.  He has a normocytic anemia. He was diagnosed with B12 deficiency on 12/24/2014. B12 was 169 (low).  He began B12 supplimentation on 02/01/2015 (last 11/15/2017).  Parietal cell antibody was positive on 07/02/2017.  Folate was 11 on 12/24/2014 and 12 on 06/06/2016.  Normal studies included a CMP, ferritin, iron studies, folate, and TSH. TSH and free T4 were normal on 04/02/2017.   Diet is good. He eats meat 2 times a week. He denies any melena, hematochezia, or hematuria. Last colonoscopy was more than 10 years ago.  Guaiac cards x 3 were negative in 02/2016.  Echocardiogram on 02/13/2018 demonstrated an EF of 62%. There was mild to moderate TR and mild MR noted. Pressures consistent with borderline PAH. Carotid doppler study on 02/15/2018 demonstrated less than 50% stenosis bilaterally.   Symptomatically, he feels "good".  He denies pain.  He is eating "ok", but is losing weight (down 11 pounds since 11/2017).  Exam reveals a right flank mass.  Hematocrit is 34.2 with a hemoglobin of 11.9.  Plan:   1. Labs today: CBC with diff, BMP, PSA, TSH,  free T4. 2. Review interval diagnostic studies:  EKG showed SR with a leftward axis at a rate of 73. No ST segment changes.   Carotid doppler demonstrated less than 50% stenosis bilaterally.  Echocardiogram demonstrated and EFof 62%. There was mild to moderate TR and mild MR noted. Pressures consistent with borderline PAH.   3. Discuss protein calorie malnutrition. Patient continues to lose weight. His weight today is 143 lb 2 oz (64.9 kg). His BMI of 19.96 kg/m places him in the normal weight category. Encouraged him to increase his intake of calorie and protein dense food choices. Patient to meet with cancer center RD today in clinic.  4. Discuss RIGHT flank mass. Will send patient for STAT ultrasound imaging today to assess.  5. Discuss prostate cancer diagnosis. PSA 34.56 today (not available at time of appointment).  Discuss likely discontinuation of Casodex and initiation of abiraterone. 6. Continue Lupron every 4 months (due 04/08/2018). 7. B12 today, then monthly. 8. RTC in 3 months  for MD assessment and labs (CBC with diff, BMP, PSA).  Addendum:  Abdominal ultrasound revealed a 21.8 x 8.3 x 10.2 cm fatty mass with internal calcifications.  Findings were felt to correspond to the CT findings of 11/2017 and not significant changed from 01/05/2006 scan.  This may reflect a lipoma with fat necrosis and resultant calcification.  Given its stability, neoplasm is felt less likely.  MRI may be useful.   Honor Loh, NP  03/14/2018, 10:19 AM   I saw and evaluated the patient, participating in the key portions of the service and reviewing pertinent diagnostic studies and records.  I reviewed the nurse practitioner's note and agree with the findings and the plan.  The assessment and plan were discussed with the patient.  Additional diagnostic studies of ultrasound are needed to clarify right flank and would change the clinical management.  Multiple questions were asked by the patient and  answered.   Nolon Stalls, MD 03/14/2018, 10:19  AM    

## 2018-03-14 NOTE — Progress Notes (Signed)
Patient was not on the schedule for B12 injection today.  B12 injection given as an add on.

## 2018-03-14 NOTE — Progress Notes (Signed)
Nutrition Assessment   Reason for Assessment:   Verbal referral from Honor Loh, NP for weight loss  ASSESSMENT:  84 year old male with prostate cancer and vit B 12 deficency currently on lupron and casodex.  Past medical history of CAD, GERD, HLD, anemia. Noted new right flank mass.  Met with patient following clinic visit.  Patient reports that "I eat".  Reports that he typically eats breakfast around 6:30am of egg, sasuage or bacon and juice or coffee.  Reports then eats lunch around noon of mostly vegetables (green beans, corn).  Supper is usually sandwich (meat typically) with nothing else.  Drinks soda and tea mostly.  Reports trouble with mostly constipation sometimes diarrhea.  Usually has BM every 2-3 days.  His PCP has given him medication to help with constipation (miralax).  Reports that he tends to his garden and works around the house. "I can't sit still."  Nutrition Focused Physical Exam: Nutrition-Focused physical exam completed. Findings are severe all areas (orbital, buccal, tricpes,ribs fat depletion,  Severe temple, severe clavicle, severe shoulder, severe scapula region, moderate hand, muscle depletion, and not examed edema.     Medications: reviewed  Labs: reviewed  Anthropometrics:   Weight 143 lb 2 oz today decreased from 152 lb on 5/30.  Noted 154 lb on 4/18.   7% weight loss in the last 3 months, borderline significant (7.5% weight loss in 3 months moderate malnutrition)   Estimated Energy Needs  Kcals: 3601-6580 kcals/d Protein: 98-113 g/d Fluid: 2.2 L/d  NUTRITION DIAGNOSIS: Inadequate oral intake related to cancer as evidenced by 7% weight loss in 3 months and severe muscle mass and fat depletion.    MALNUTRITION DIAGNOSIS: patient meets criteria for severe malnutrition in chronic illness as evidenced by severe muscle  Mass loss and fat depletion.     INTERVENTION:  Discussed importance of good nutrition with patient today. Discussed foods high  in calories and protein and handout given to patient.  Discussed ways to increase calories and protein with current diet intake.   Provided patient with samples of ensure/boost/carnation instant breakfast shakes to try. Would encourage 2 per day between meals.  Patient could benefit from adding MVI daily May benefit from adding appetite stimulant as well.     MONITORING, EVALUATION, GOAL: weight trends, intake   NEXT VISIT: October 24 after MD visit  Annlee Glandon B. Zenia Resides, [place], [place] Registered Dietitian [phone] (pager)

## 2018-03-21 ENCOUNTER — Ambulatory Visit: Payer: Self-pay

## 2018-04-02 ENCOUNTER — Encounter: Payer: Self-pay | Admitting: Adult Health

## 2018-04-02 ENCOUNTER — Ambulatory Visit: Payer: Medicare Other | Admitting: Adult Health

## 2018-04-02 VITALS — Resp 16 | Ht 71.0 in | Wt 146.6 lb

## 2018-04-02 DIAGNOSIS — R9721 Rising PSA following treatment for malignant neoplasm of prostate: Secondary | ICD-10-CM | POA: Diagnosis not present

## 2018-04-02 DIAGNOSIS — I951 Orthostatic hypotension: Secondary | ICD-10-CM | POA: Diagnosis not present

## 2018-04-02 DIAGNOSIS — R634 Abnormal weight loss: Secondary | ICD-10-CM

## 2018-04-02 MED ORDER — MIDODRINE HCL 2.5 MG PO TABS: 2.5000 mg | ORAL_TABLET | Freq: Two times a day (BID) | ORAL | 0 refills | Status: DC

## 2018-04-02 NOTE — Patient Instructions (Signed)
Hypotension As your heart beats, it forces blood through your body. This force is called blood pressure. If you have hypotension, you have low blood pressure. When your blood pressure is too low, you may not get enough blood to your brain. You may feel weak, feel light-headed, have a fast heartbeat, or even pass out (faint). Follow these instructions at home: Eating and drinking  Drink enough fluids to keep your pee (urine) clear or pale yellow.  Eat a healthy diet, and follow instructions from your doctor about eating or drinking restrictions. A healthy diet includes: ? Fresh fruits and vegetables. ? Whole grains. ? Low-fat (lean) meats. ? Low-fat dairy products.  Eat extra salt only as told. Do not add extra salt to your diet unless your doctor tells you to.  Eat small meals often.  Avoid standing up quickly after you eat. Medicines  Take over-the-counter and prescription medicines only as told by your doctor. ? Follow instructions from your doctor about changing how much you take (the dosage) of your medicines, if this applies. ? Do not stop or change your medicine on your own. General instructions  Wear compression stockings as told by your doctor.  Get up slowly from lying down or sitting.  Avoid hot showers and a lot of heat as told by your doctor.  Return to your normal activities as told by your doctor. Ask what activities are safe for you.  Do not use any products that contain nicotine or tobacco, such as cigarettes and e-cigarettes. If you need help quitting, ask your doctor.  Keep all follow-up visits as told by your doctor. This is important. Contact a doctor if:  You throw up (vomit).  You have watery poop (diarrhea).  You have a fever for more than 2-3 days.  You feel more thirsty than normal.  You feel weak and tired. Get help right away if:  You have chest pain.  You have a fast or irregular heartbeat.  You lose feeling (get numbness) in any part  of your body.  You cannot move your arms or your legs.  You have trouble talking.  You get sweaty or feel light-headed.  You faint.  You have trouble breathing.  You have trouble staying awake.  You feel confused. This information is not intended to replace advice given to you by your health care provider. Make sure you discuss any questions you have with your health care provider. Document Released: 11/01/2009 Document Revised: 04/25/2016 Document Reviewed: 04/25/2016 Elsevier Interactive Patient Education  2017 [employer]   

## 2018-04-02 NOTE — Progress Notes (Signed)
[facility] [place], [place] [address]  Internal MEDICINE  Office Visit Note  Patient Name: Charles Flores  [id number]  [id number]  Date of Service: 04/12/2018  Chief Complaint  Patient presents with  . Dizziness    review ultrasounds     HPI Pt here for follow up since ultrasounds were completed.  Pt had Carotid US that showed less than 50% stenosis bilaterally, without plaque formation.  He also had an Abdominal US that appeared unchanged since a previous CT scan as far as a questionable mass, that appears to be a lipoma in his right flank. He appears generally healthy today.  Denies complaints in exam room. The patient has lost a total of 8 pounds since his january visit, but states he has been boost once or twice a day and reports some weight gain in the past few weeks.       Current Medication: Outpatient Encounter Medications as of 04/02/2018  Medication Sig  . leuprolide (LUPRON) 7.5 MG injection Inject 7.5 mg into the muscle once. Per dr Elnoria Howard urology  . polyethylene glycol (MIRALAX / GLYCOLAX) packet Take 3,350 packets by mouth continuous as needed.  . [DISCONTINUED] bicalutamide (CASODEX) 50 MG tablet TAKE 1 TABLET BY MOUTH EVERY DAY  . midodrine (PROAMATINE) 2.5 MG tablet Take 1 tablet (2.5 mg total) by mouth 2 (two) times daily with a meal.   No facility-administered encounter medications on file as of 04/02/2018.     Surgical History: Past Surgical History:  Procedure Laterality Date  . PROSTATE BIOPSY      Medical History: Past Medical History:  Diagnosis Date  . Anemia 12/24/2014  . Arthritis   . B12 deficiency anemia 01/01/2015  . Cataract   . Coronary artery disease   . GERD (gastroesophageal reflux disease)   . Hyperlipidemia   . Hyperlipidemia   . Prostate cancer ([place])   . Renal insufficiency 04/15/2017  . Thyroid nodule 03/14/2016    Family History: Family History  Problem Relation Age of Onset  . Diabetes Sister   . Stroke  Sister   . Gastric cancer Sister   . Cancer Mother   . Breast cancer Mother   . Kidney disease Father     Social History   Socioeconomic History  . Marital status: Single    Spouse name: Not on file  . Number of children: Not on file  . Years of education: Not on file  . Highest education level: Not on file  Occupational History  . Not on file  Social Needs  . Financial resource strain: Not on file  . Food insecurity:    Worry: Not on file    Inability: Not on file  . Transportation needs:    Medical: Not on file    Non-medical: Not on file  Tobacco Use  . Smoking status: Former Smoker    Packs/day: 0.50    Years: 15.00    Pack years: 7.50    Types: Cigarettes  . Smokeless tobacco: Never Used  . Tobacco comment: quit 40 years  Substance and Sexual Activity  . Alcohol use: No    Alcohol/week: 0.0 standard drinks  . Drug use: No  . Sexual activity: Yes    Partners: Male  Lifestyle  . Physical activity:    Days per week: Not on file    Minutes per session: Not on file  . Stress: Not on file  Relationships  . Social connections:    Talks on phone:  Not on file    Gets together: Not on file    Attends religious service: Not on file    Active member of club or organization: Not on file    Attends meetings of clubs or organizations: Not on file    Relationship status: Not on file  . Intimate partner violence:    Fear of current or ex partner: Not on file    Emotionally abused: Not on file    Physically abused: Not on file    Forced sexual activity: Not on file  Other Topics Concern  . Not on file  Social History Narrative  . Not on file      Review of Systems  Constitutional: Negative.  Negative for chills, fatigue and unexpected weight change.  HENT: Negative.  Negative for congestion, rhinorrhea, sneezing and sore throat.   Eyes: Negative for redness.  Respiratory: Negative.  Negative for cough, chest tightness and shortness of breath.   Cardiovascular:  Negative.  Negative for chest pain and palpitations.  Gastrointestinal: Negative.  Negative for abdominal pain, constipation, diarrhea, nausea and vomiting.  Endocrine: Negative.   Genitourinary: Negative.  Negative for dysuria and frequency.  Musculoskeletal: Negative.  Negative for arthralgias, back pain, joint swelling and neck pain.  Skin: Negative.  Negative for rash.  Allergic/Immunologic: Negative.   Neurological: Negative.  Negative for tremors and numbness.  Hematological: Negative for adenopathy. Does not bruise/bleed easily.  Psychiatric/Behavioral: Negative.  Negative for behavioral problems, sleep disturbance and suicidal ideas. The patient is not nervous/anxious.     Vital Signs: Resp 16   Ht 5\' 11"  (1.803 m)   Wt 146 lb 9.6 oz (66.5 kg)   SpO2 96%   BMI 20.45 kg/m    Physical Exam  Constitutional: He is oriented to person, place, and time. He appears well-developed and well-nourished. No distress.  HENT:  Head: Normocephalic and atraumatic.  Mouth/Throat: Oropharynx is clear and moist. No oropharyngeal exudate.  Eyes: Pupils are equal, round, and reactive to light. EOM are normal.  Neck: Normal range of motion. Neck supple. No JVD present. No tracheal deviation present. No thyromegaly present.  Cardiovascular: Normal rate, regular rhythm and normal heart sounds. Exam reveals no gallop and no friction rub.  No murmur heard. Pulmonary/Chest: Effort normal and breath sounds normal. No respiratory distress. He has no wheezes. He has no rales. He exhibits no tenderness.  Abdominal: Soft. There is no tenderness. There is no guarding.  Musculoskeletal: Normal range of motion.  Lymphadenopathy:    He has no cervical adenopathy.  Neurological: He is alert and oriented to person, place, and time. No cranial nerve deficit.  Skin: Skin is warm and dry. He is not diaphoretic.  Psychiatric: He has a normal mood and affect. His behavior is normal. Judgment and thought content  normal.  Nursing note and vitals reviewed.   Assessment/Plan: 1. Orthostatic hypotension His blood pressure reading are soft at this visit. This has been true for the last few visits.  He reports dizziness, he also reports symptoms of orthostatic hypotension.  Take Midodrine as directed, follow up in 4 weeks.  -Midodrine 2.5mg  po BID #60, 0 refills - midodrine (PROAMATINE) 2.5 MG tablet; Take 1 tablet (2.5 mg total) by mouth 2 (two) times daily with a meal.  Dispense: 60 tablet; Refill: 0  2. Weight loss, unintentional Pt reports some weight gain since starting Boost.  He is a total of 8 pounds down since january  3. Increasing PSA level after treatment for  prostate cancer -Per oncology  General Counseling: kabir brannock understanding of the findings of todays visit and agrees with plan of treatment. I have discussed any further diagnostic evaluation that may be needed or ordered today. We also reviewed his medications today. he has been encouraged to call the office with any questions or concerns that should arise related to todays visit.   Meds ordered this encounter  Medications  . midodrine (PROAMATINE) 2.5 MG tablet    Sig: Take 1 tablet (2.5 mg total) by mouth 2 (two) times daily with a meal.    Dispense:  60 tablet    Refill:  0    Time spent: 25 Minutes  This patient was seen by Orson Gear AGNP-C in Collaboration with Dr Lavera Guise as a part of collaborative care agreement    Dr Lavera Guise Internal medicine

## 2018-04-05 ENCOUNTER — Other Ambulatory Visit: Payer: Self-pay | Admitting: Urgent Care

## 2018-04-11 ENCOUNTER — Inpatient Hospital Stay: Payer: Medicare Other | Attending: Hematology and Oncology

## 2018-04-11 DIAGNOSIS — E538 Deficiency of other specified B group vitamins: Secondary | ICD-10-CM | POA: Diagnosis not present

## 2018-04-11 DIAGNOSIS — D519 Vitamin B12 deficiency anemia, unspecified: Secondary | ICD-10-CM

## 2018-04-11 MED ORDER — CYANOCOBALAMIN 1000 MCG/ML IJ SOLN
1000.0000 ug | Freq: Once | INTRAMUSCULAR | Status: AC
  Administered 2018-04-11: 1000 ug via INTRAMUSCULAR

## 2018-04-16 MED ORDER — FENTANYL CITRATE (PF) 100 MCG/2ML IJ SOLN
INTRAMUSCULAR | Status: AC
  Filled 2018-04-16: qty 2

## 2018-04-30 ENCOUNTER — Ambulatory Visit: Payer: Self-pay | Admitting: Adult Health

## 2018-05-06 ENCOUNTER — Encounter: Payer: Self-pay | Admitting: Adult Health

## 2018-05-06 ENCOUNTER — Other Ambulatory Visit: Payer: Self-pay | Admitting: Adult Health

## 2018-05-06 ENCOUNTER — Ambulatory Visit (INDEPENDENT_AMBULATORY_CARE_PROVIDER_SITE_OTHER): Payer: Medicare Other | Admitting: Adult Health

## 2018-05-06 VITALS — BP 98/58 | HR 94 | Resp 16 | Ht 71.0 in | Wt 147.0 lb

## 2018-05-06 DIAGNOSIS — C61 Malignant neoplasm of prostate: Secondary | ICD-10-CM | POA: Diagnosis not present

## 2018-05-06 DIAGNOSIS — D519 Vitamin B12 deficiency anemia, unspecified: Secondary | ICD-10-CM

## 2018-05-06 DIAGNOSIS — I951 Orthostatic hypotension: Secondary | ICD-10-CM | POA: Diagnosis not present

## 2018-05-06 DIAGNOSIS — I950 Idiopathic hypotension: Secondary | ICD-10-CM | POA: Diagnosis not present

## 2018-05-06 DIAGNOSIS — R634 Abnormal weight loss: Secondary | ICD-10-CM

## 2018-05-06 MED ORDER — MIDODRINE HCL 2.5 MG PO TABS: 2.5000 mg | ORAL_TABLET | Freq: Three times a day (TID) | ORAL | 1 refills | Status: DC

## 2018-05-06 MED ORDER — PNEUMOCOCCAL VAC POLYVALENT 25 MCG/0.5ML IJ INJ: 0.5000 mL | INJECTION | INTRAMUSCULAR | 0 refills | Status: AC

## 2018-05-06 NOTE — Patient Instructions (Signed)
Hypotension As your heart beats, it forces blood through your body. This force is called blood pressure. If you have hypotension, you have low blood pressure. When your blood pressure is too low, you may not get enough blood to your brain. You may feel weak, feel light-headed, have a fast heartbeat, or even pass out (faint). Follow these instructions at home: Eating and drinking  Drink enough fluids to keep your pee (urine) clear or pale yellow.  Eat a healthy diet, and follow instructions from your doctor about eating or drinking restrictions. A healthy diet includes: ? Fresh fruits and vegetables. ? Whole grains. ? Low-fat (lean) meats. ? Low-fat dairy products.  Eat extra salt only as told. Do not add extra salt to your diet unless your doctor tells you to.  Eat small meals often.  Avoid standing up quickly after you eat. Medicines  Take over-the-counter and prescription medicines only as told by your doctor. ? Follow instructions from your doctor about changing how much you take (the dosage) of your medicines, if this applies. ? Do not stop or change your medicine on your own. General instructions  Wear compression stockings as told by your doctor.  Get up slowly from lying down or sitting.  Avoid hot showers and a lot of heat as told by your doctor.  Return to your normal activities as told by your doctor. Ask what activities are safe for you.  Do not use any products that contain nicotine or tobacco, such as cigarettes and e-cigarettes. If you need help quitting, ask your doctor.  Keep all follow-up visits as told by your doctor. This is important. Contact a doctor if:  You throw up (vomit).  You have watery poop (diarrhea).  You have a fever for more than 2-3 days.  You feel more thirsty than normal.  You feel weak and tired. Get help right away if:  You have chest pain.  You have a fast or irregular heartbeat.  You lose feeling (get numbness) in any part  of your body.  You cannot move your arms or your legs.  You have trouble talking.  You get sweaty or feel light-headed.  You faint.  You have trouble breathing.  You have trouble staying awake.  You feel confused. This information is not intended to replace advice given to you by your health care provider. Make sure you discuss any questions you have with your health care provider. Document Released: 11/01/2009 Document Revised: 04/25/2016 Document Reviewed: 04/25/2016 Elsevier Interactive Patient Education  2017 [place], Vitamin B12 injection What is this medicine? CYANOCOBALAMIN (sye an oh koe BAL a min) is a man made form of vitamin B12. Vitamin B12 is used in the growth of healthy blood cells, nerve cells, and proteins in the body. It also helps with the metabolism of fats and carbohydrates. This medicine is used to treat people who can not absorb vitamin B12. This medicine may be used for other purposes; ask your health care provider or pharmacist if you have questions. COMMON BRAND NAME(S): B-12 Compliance Kit, B-12 Injection Kit, Cyomin, LA-12, Nutri-Twelve, Physicians EZ Use B-12, Primabalt What should I tell my health care provider before I take this medicine? They need to know if you have any of these conditions: -kidney disease -Leber's disease -megaloblastic anemia -an unusual or allergic reaction to cyanocobalamin, cobalt, other medicines, foods, dyes, or preservatives -pregnant or trying to get pregnant -breast-feeding How should I use this medicine? This medicine is injected into a muscle  or deeply under the skin. It is usually given by a health care professional in a clinic or doctor's office. However, your doctor may teach you how to inject yourself. Follow all instructions. Talk to your pediatrician regarding the use of this medicine in children. Special care may be needed. Overdosage: If you think you have taken too much of this medicine  contact a poison control center or emergency room at once. NOTE: This medicine is only for you. Do not share this medicine with others. What if I miss a dose? If you are given your dose at a clinic or doctor's office, call to reschedule your appointment. If you give your own injections and you miss a dose, take it as soon as you can. If it is almost time for your next dose, take only that dose. Do not take double or extra doses. What may interact with this medicine? -colchicine -heavy alcohol intake This list may not describe all possible interactions. Give your health care provider a list of all the medicines, herbs, non-prescription drugs, or dietary supplements you use. Also tell them if you smoke, drink alcohol, or use illegal drugs. Some items may interact with your medicine. What should I watch for while using this medicine? Visit your doctor or health care professional regularly. You may need blood work done while you are taking this medicine. You may need to follow a special diet. Talk to your doctor. Limit your alcohol intake and avoid smoking to get the best benefit. What side effects may I notice from receiving this medicine? Side effects that you should report to your doctor or health care professional as soon as possible: -allergic reactions like skin rash, itching or hives, swelling of the face, lips, or tongue -blue tint to skin -chest tightness, pain -difficulty breathing, wheezing -dizziness -red, swollen painful area on the leg Side effects that usually do not require medical attention (report to your doctor or health care professional if they continue or are bothersome): -diarrhea -headache This list may not describe all possible side effects. Call your doctor for medical advice about side effects. You may report side effects to FDA at 1-800-FDA-1088. Where should I keep my medicine? Keep out of the reach of children. Store at room temperature between 15 and 30 degrees C  (59 and 85 degrees F). Protect from light. Throw away any unused medicine after the expiration date. NOTE: This sheet is a summary. It may not cover all possible information. If you have questions about this medicine, talk to your doctor, pharmacist, or health care provider.  2018 Elsevier/Gold Standard (2007-11-18 22:10:20)

## 2018-05-06 NOTE — Progress Notes (Signed)
[facility] [place], [place] [address]  Internal MEDICINE  Office Visit Note  Patient Name: Charles Flores  [id number]  [id number]  Date of Service: 05/13/2018  Chief Complaint  Patient presents with  . Hypotension    HPI Pt here for follow up on hypotension.  He was seen one month ago for hypotension and dizziness, and prescribed Midodrine.  He reports the dizziness has improved with the medication, however, he feels sleepy when taking the medication.  He denies other side effects at this time.  He was able to gain one pound since last visit.  In the past he was found to be B12 deficient, he is set to receive a B12 injection on 9/19, this week.    Current Medication: Outpatient Encounter Medications as of 05/06/2018  Medication Sig  . bicalutamide (CASODEX) 50 MG tablet TAKE 1 TABLET BY MOUTH EVERY DAY  . leuprolide (LUPRON) 7.5 MG injection Inject 7.5 mg into the muscle once. Per dr Elnoria Howard urology  . midodrine (PROAMATINE) 2.5 MG tablet Take 1 tablet (2.5 mg total) by mouth 3 (three) times daily with meals.  . polyethylene glycol (MIRALAX / GLYCOLAX) packet Take 3,350 packets by mouth continuous as needed.  . [DISCONTINUED] midodrine (PROAMATINE) 2.5 MG tablet Take 1 tablet (2.5 mg total) by mouth 2 (two) times daily with a meal.   No facility-administered encounter medications on file as of 05/06/2018.     Surgical History: Past Surgical History:  Procedure Laterality Date  . PROSTATE BIOPSY      Medical History: Past Medical History:  Diagnosis Date  . Anemia 12/24/2014  . Arthritis   . B12 deficiency anemia 01/01/2015  . Cataract   . Coronary artery disease   . GERD (gastroesophageal reflux disease)   . Hyperlipidemia   . Hyperlipidemia   . Prostate cancer ([place])   . Renal insufficiency 04/15/2017  . Thyroid nodule 03/14/2016    Family History: Family History  Problem Relation Age of Onset  . Diabetes Sister   . Stroke Sister   . Gastric  cancer Sister   . Cancer Mother   . Breast cancer Mother   . Kidney disease Father     Social History   Socioeconomic History  . Marital status: Single    Spouse name: Not on file  . Number of children: Not on file  . Years of education: Not on file  . Highest education level: Not on file  Occupational History  . Not on file  Social Needs  . Financial resource strain: Not on file  . Food insecurity:    Worry: Not on file    Inability: Not on file  . Transportation needs:    Medical: Not on file    Non-medical: Not on file  Tobacco Use  . Smoking status: Former Smoker    Packs/day: 0.50    Years: 15.00    Pack years: 7.50    Types: Cigarettes  . Smokeless tobacco: Never Used  . Tobacco comment: quit 40 years  Substance and Sexual Activity  . Alcohol use: No    Alcohol/week: 0.0 standard drinks  . Drug use: No  . Sexual activity: Yes    Partners: Male  Lifestyle  . Physical activity:    Days per week: Not on file    Minutes per session: Not on file  . Stress: Not on file  Relationships  . Social connections:    Talks on phone: Not on file  Gets together: Not on file    Attends religious service: Not on file    Active member of club or organization: Not on file    Attends meetings of clubs or organizations: Not on file    Relationship status: Not on file  . Intimate partner violence:    Fear of current or ex partner: Not on file    Emotionally abused: Not on file    Physically abused: Not on file    Forced sexual activity: Not on file  Other Topics Concern  . Not on file  Social History Narrative  . Not on file   Review of Systems  Constitutional: Positive for appetite change and fatigue. Negative for chills and fever.  HENT: Negative.  Negative for congestion, rhinorrhea, sneezing and sore throat.   Eyes: Negative for redness.  Respiratory: Negative.  Negative for cough, chest tightness and shortness of breath.   Cardiovascular: Negative.  Negative  for chest pain and palpitations.  Gastrointestinal: Negative.  Negative for abdominal pain, constipation, diarrhea, nausea and vomiting.  Endocrine: Negative.   Genitourinary: Negative.  Negative for dysuria and frequency.  Musculoskeletal: Negative.  Negative for arthralgias, back pain, joint swelling and neck pain.  Skin: Negative.  Negative for rash.  Allergic/Immunologic: Negative.   Neurological: Positive for dizziness. Negative for tremors and numbness.  Hematological: Negative for adenopathy. Does not bruise/bleed easily.  Psychiatric/Behavioral: Negative.  Negative for behavioral problems, sleep disturbance and suicidal ideas. The patient is not nervous/anxious.     Vital Signs: BP (!) 98/58   Pulse 94   Resp 16   Ht 5\' 11"  (1.803 m)   Wt 147 lb (66.7 kg)   SpO2 95%   BMI 20.50 kg/m   Physical Exam  Constitutional: He is oriented to person, place, and time. He appears well-developed and well-nourished. No distress.  HENT:  Head: Normocephalic and atraumatic.  Mouth/Throat: Oropharynx is clear and moist. No oropharyngeal exudate.  Eyes: Pupils are equal, round, and reactive to light. EOM are normal.  Neck: Normal range of motion. Neck supple. No JVD present. No tracheal deviation present. No thyromegaly present.  Cardiovascular: Normal rate, regular rhythm and normal heart sounds. Exam reveals no gallop and no friction rub.  No murmur heard. Pulmonary/Chest: Effort normal and breath sounds normal. No respiratory distress. He has no wheezes. He has no rales. He exhibits no tenderness.  Abdominal: Soft. There is no tenderness. There is no guarding.  Musculoskeletal: Normal range of motion.  Lymphadenopathy:    He has no cervical adenopathy.  Neurological: He is alert and oriented to person, place, and time. No cranial nerve deficit.  Skin: Skin is warm and dry. He is not diaphoretic.  Psychiatric: He has a normal mood and affect. His behavior is normal. Judgment and thought  content normal.  Nursing note and vitals reviewed.  Assessment/Plan: 1. Anemia due to vitamin B12 deficiency, unspecified B12 deficiency type Followed by Hematology.  He will receive B12 this week.    2. Weight loss, unintentional PT has gained one pound since last visit. Reports good appetite.  3. Idiopathic hypotension Refilld Midodrine, and increased to TID.  - midodrine (PROAMATINE) 2.5 MG tablet; Take 1 tablet (2.5 mg total) by mouth 3 (three) times daily with meals.  Dispense: 90 tablet; Refill: 1  4. Prostate cancer [facility]) Followed by Oncology, was treated.  5. Orthostatic hypotension - midodrine (PROAMATINE) 2.5 MG tablet; Take 1 tablet (2.5 mg total) by mouth 3 (three) times daily with meals.  Dispense: 90 tablet; Refill: 1  General Counseling: Joron verbalizes understanding of the findings of todays visit and agrees with plan of treatment. I have discussed any further diagnostic evaluation that may be needed or ordered today. We also reviewed his medications today. he has been encouraged to call the office with any questions or concerns that should arise related to todays visit.   Meds ordered this encounter  Medications  . midodrine (PROAMATINE) 2.5 MG tablet    Sig: Take 1 tablet (2.5 mg total) by mouth 3 (three) times daily with meals.    Dispense:  90 tablet    Refill:  1    Time spent: 25 Minutes   This patient was seen by Orson Gear AGNP-C in Collaboration with Dr Lavera Guise as a part of collaborative care agreement    Dr Lavera Guise Internal medicine

## 2018-05-09 ENCOUNTER — Inpatient Hospital Stay: Payer: Medicare Other | Attending: Hematology and Oncology

## 2018-06-13 ENCOUNTER — Inpatient Hospital Stay: Payer: Medicare Other

## 2018-06-13 ENCOUNTER — Inpatient Hospital Stay: Payer: Medicare Other | Attending: Hematology and Oncology

## 2018-06-13 ENCOUNTER — Inpatient Hospital Stay (HOSPITAL_BASED_OUTPATIENT_CLINIC_OR_DEPARTMENT_OTHER): Payer: Medicare Other | Admitting: Urgent Care

## 2018-06-13 VITALS — BP 112/75 | HR 85 | Temp 96.2°F | Resp 18 | Wt 144.5 lb

## 2018-06-13 DIAGNOSIS — I2510 Atherosclerotic heart disease of native coronary artery without angina pectoris: Secondary | ICD-10-CM | POA: Diagnosis not present

## 2018-06-13 DIAGNOSIS — Z5111 Encounter for antineoplastic chemotherapy: Secondary | ICD-10-CM | POA: Insufficient documentation

## 2018-06-13 DIAGNOSIS — D171 Benign lipomatous neoplasm of skin and subcutaneous tissue of trunk: Secondary | ICD-10-CM | POA: Diagnosis not present

## 2018-06-13 DIAGNOSIS — D649 Anemia, unspecified: Secondary | ICD-10-CM

## 2018-06-13 DIAGNOSIS — Z87891 Personal history of nicotine dependence: Secondary | ICD-10-CM | POA: Diagnosis not present

## 2018-06-13 DIAGNOSIS — E041 Nontoxic single thyroid nodule: Secondary | ICD-10-CM

## 2018-06-13 DIAGNOSIS — R1907 Generalized intra-abdominal and pelvic swelling, mass and lump: Secondary | ICD-10-CM

## 2018-06-13 DIAGNOSIS — E538 Deficiency of other specified B group vitamins: Secondary | ICD-10-CM | POA: Insufficient documentation

## 2018-06-13 DIAGNOSIS — C61 Malignant neoplasm of prostate: Secondary | ICD-10-CM | POA: Insufficient documentation

## 2018-06-13 DIAGNOSIS — E46 Unspecified protein-calorie malnutrition: Secondary | ICD-10-CM | POA: Diagnosis not present

## 2018-06-13 DIAGNOSIS — Z79818 Long term (current) use of other agents affecting estrogen receptors and estrogen levels: Secondary | ICD-10-CM | POA: Diagnosis not present

## 2018-06-13 DIAGNOSIS — D519 Vitamin B12 deficiency anemia, unspecified: Secondary | ICD-10-CM

## 2018-06-13 DIAGNOSIS — Z5181 Encounter for therapeutic drug level monitoring: Secondary | ICD-10-CM

## 2018-06-13 LAB — CBC WITH DIFFERENTIAL/PLATELET
Abs Immature Granulocytes: 0 10*3/uL (ref 0.00–0.07)
Basophils Absolute: 0 10*3/uL (ref 0.0–0.1)
Basophils Relative: 1 %
Eosinophils Absolute: 0.2 10*3/uL (ref 0.0–0.5)
Eosinophils Relative: 6 %
HCT: 33.1 % — ABNORMAL LOW (ref 39.0–52.0)
Hemoglobin: 11.1 g/dL — ABNORMAL LOW (ref 13.0–17.0)
Immature Granulocytes: 0 %
Lymphocytes Relative: 27 %
Lymphs Abs: 0.9 10*3/uL (ref 0.7–4.0)
MCH: 32.8 pg (ref 26.0–34.0)
MCHC: 33.5 g/dL (ref 30.0–36.0)
MCV: 97.9 fL (ref 80.0–100.0)
Monocytes Absolute: 0.5 10*3/uL (ref 0.1–1.0)
Monocytes Relative: 14 %
Neutro Abs: 1.8 10*3/uL (ref 1.7–7.7)
Neutrophils Relative %: 52 %
Platelets: 171 10*3/uL (ref 150–400)
RBC: 3.38 MIL/uL — ABNORMAL LOW (ref 4.22–5.81)
RDW: 12.5 % (ref 11.5–15.5)
WBC: 3.5 10*3/uL — ABNORMAL LOW (ref 4.0–10.5)
nRBC: 0 % (ref 0.0–0.2)

## 2018-06-13 LAB — BASIC METABOLIC PANEL
Anion gap: 5 (ref 5–15)
BUN: 22 mg/dL (ref 8–23)
CO2: 26 mmol/L (ref 22–32)
Calcium: 9.4 mg/dL (ref 8.9–10.3)
Chloride: 107 mmol/L (ref 98–111)
Creatinine, Ser: 1.42 mg/dL — ABNORMAL HIGH (ref 0.61–1.24)
GFR calc Af Amer: 49 mL/min — ABNORMAL LOW (ref >60)
GFR calc non Af Amer: 42 mL/min — ABNORMAL LOW (ref >60)
Glucose, Bld: 94 mg/dL (ref 70–99)
Potassium: 4.2 mmol/L (ref 3.5–5.1)
Sodium: 138 mmol/L (ref 135–145)

## 2018-06-13 LAB — PSA: Prostatic Specific Antigen: 31.24 ng/mL — ABNORMAL HIGH (ref 0.00–4.00)

## 2018-06-13 MED ORDER — CYANOCOBALAMIN 1000 MCG/ML IJ SOLN
1000.0000 ug | Freq: Once | INTRAMUSCULAR | Status: AC
  Administered 2018-06-13: 1000 ug via INTRAMUSCULAR
  Filled 2018-06-13: qty 1

## 2018-06-13 MED ORDER — LEUPROLIDE ACETATE (4 MONTH) 30 MG IM KIT
30.0000 mg | PACK | Freq: Once | INTRAMUSCULAR | Status: AC
  Administered 2018-06-13: 30 mg via INTRAMUSCULAR
  Filled 2018-06-13: qty 30

## 2018-06-13 NOTE — Progress Notes (Signed)
[facility] day:  06/13/2018    Chief Complaint: Charles Flores is an 84 y.o. male with prostate cancer and B12 deficiency who is seen for  3 month assessment.  HPI: The patient was last seen in the medical oncology on 03/14/2018.  At that time, patient was experiencing significant vertiginous symptoms.  He complained of pain in his LEFT temple at times.  No other symptoms.  Denied bleeding.  Appetite described as "okay"; weight down 11 pounds.  Exam revealed right flank mass.  Hemoglobin 11.9 hematocrit 34.2.  Patient was seen in consult on 06/14/2018 by Cyndi Bender, RD.  Notes reviewed.  Patient described an adequate appetite, however had experienced a 7% weight loss in 3 months and severe muscle mass and fat depletion.  Weight gain strategies discussed including the addition of nutritional supplement shakes.  Samples provided.  Discussed potential pharmacological appetite stimulation.  Limited abdominal ultrasound was done on 03/14/2018 revealed a 21.8 x 8.3 x 10.2 cm fatty mass with internal calcifications.  Findings felt to correspond to the CT findings on 11/2017, and not significantly changed from 12/2015 scan.  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  Neoplasm felt to be unlikely.  Patient continues on monthly parenteral B12 supplementation.  Last injection was received on 04/11/2018.  He notes that did not feel like coming to his September appointment.  Patient was supposed to receive Lupron injection at the same time that he received his B12 in August, however it was inadvertently not administered.  In interim, patient presents today feeling generally well.  He notes stable energy.  Patient states, "I am always open around doing something".  Patient has no acute problems today.  He denies any urinary symptoms related to his known prostate malignancy. Patient denies bleeding; no hematochezia, melena, or gross  hematuria. Patient denies that he has experienced any B symptoms. He denies any interval infections.   Patient once again advises that he maintains an adequate appetite. He notes that he is eating well. Weight today is 144 lb 8 oz (65.5 kg), which compared to his last visit to the clinic, represents a 1 pound weight gain.  Patient denies pain in the clinic today.  Past Medical History:  Diagnosis Date  . Anemia 12/24/2014  . Arthritis   . B12 deficiency anemia 01/01/2015  . Cataract   . Coronary artery disease   . GERD (gastroesophageal reflux disease)   . Hyperlipidemia   . Hyperlipidemia   . Prostate cancer ([place])   . Renal insufficiency 04/15/2017  . Thyroid nodule 03/14/2016    Past Surgical History:  Procedure Laterality Date  . PROSTATE BIOPSY      Family History  Problem Relation Age of Onset  . Diabetes Sister   . Stroke Sister   . Gastric cancer Sister   . Cancer Mother   . Breast cancer Mother   . Kidney disease Father     Social History:  reports that he has quit smoking. His smoking use included cigarettes. He has a 7.50 pack-year smoking history. He has never used smokeless tobacco. He reports that he does not drink alcohol or use drugs.  He lives in [place].  He states that his car broke down and cousin died and thus was unable to get to clinic.  He is alone today.  Allergies: No Known Allergies  Current Outpatient Medications  Medication Sig Dispense Refill  . bicalutamide (CASODEX) 50 MG tablet  TAKE 1 TABLET BY MOUTH EVERY DAY 90 tablet 0  . leuprolide (LUPRON) 7.5 MG injection Inject 7.5 mg into the muscle once. Per dr Elnoria Howard urology    . midodrine (PROAMATINE) 2.5 MG tablet Take 1 tablet (2.5 mg total) by mouth 3 (three) times daily with meals. 90 tablet 1   No current facility-administered medications for this visit.     Review of Systems  Constitutional: Negative for diaphoresis, fever, malaise/fatigue and weight loss (up 1 pound).  HENT: Negative.    Eyes: Negative.   Respiratory: Negative for cough, hemoptysis, sputum production and shortness of breath.   Cardiovascular: Positive for leg swelling. Negative for chest pain, palpitations, orthopnea and PND.  Gastrointestinal: Negative for abdominal pain, blood in stool, constipation, diarrhea, melena, nausea and vomiting.       Stable RIGHT flank mass.   Genitourinary: Negative for dysuria, frequency, hematuria and urgency.  Musculoskeletal: Negative for back pain, falls, joint pain and myalgias.  Skin: Negative for itching and rash.  Neurological: Negative for dizziness, tremors, weakness and headaches.  Endo/Heme/Allergies: Does not bruise/bleed easily.  Psychiatric/Behavioral: Negative for depression, memory loss and suicidal ideas. The patient is not nervous/anxious and does not have insomnia.   All other systems reviewed and are negative.   Performance status (ECOG): 1 - Symptomatic but completely ambulatory  Vital Signs BP 112/75 (BP Location: Left Arm, Patient Position: Sitting)   Pulse 85   Temp (!) 96.2 F (35.7 C) (Tympanic)   Resp 18   Wt 144 lb 8 oz (65.5 kg)   BMI 20.15 kg/m   Physical Exam  Constitutional: He is oriented to person, place, and time. He appears healthy. No distress.  Thin gentleman  HENT:  Head: Normocephalic and atraumatic.  Mouth/Throat: Oropharynx is clear and moist and mucous membranes are normal. He has dentures.  Eyes: Pupils are equal, round, and reactive to light. EOM are normal. No scleral icterus.  Brown eyes.  Slight ptosis.  Neck: Normal range of motion. Neck supple. No tracheal deviation present. No thyromegaly present.  Cardiovascular: Normal rate, regular rhythm, normal heart sounds and intact distal pulses. Exam reveals no gallop and no friction rub.  No murmur heard. Pulmonary/Chest: Effort normal and breath sounds normal. No respiratory distress. He has no wheezes. He has no rales.  Abdominal: Soft. Bowel sounds are normal. He  exhibits mass (Large right flank mass: Previously ultrasound consistent with a lipomatous growth fat necrosis and resultant calcification). He exhibits no distension. There is no tenderness.  Musculoskeletal: Normal range of motion. He exhibits no edema or tenderness.  Lymphadenopathy:    He has no cervical adenopathy.    He has no axillary adenopathy.       Right: No inguinal and no supraclavicular adenopathy present.       Left: No inguinal and no supraclavicular adenopathy present.  Neurological: He is alert and oriented to person, place, and time.  Skin: Skin is warm and dry. No rash noted. No erythema.  Psychiatric: Mood, affect and judgment normal.  Nursing note and vitals reviewed.    Appointment on 06/13/2018  Component Date Value Ref Range Status  . Sodium 06/13/2018 138  135 - 145 mmol/L Final  . Potassium 06/13/2018 4.2  3.5 - 5.1 mmol/L Final  . Chloride 06/13/2018 107  98 - 111 mmol/L Final  . CO2 06/13/2018 26  22 - 32 mmol/L Final  . Glucose, Bld 06/13/2018 94  70 - 99 mg/dL Final  . BUN 06/13/2018 22  8 -  23 mg/dL Final  . Creatinine, Ser 06/13/2018 1.42* 0.61 - 1.24 mg/dL Final  . Calcium 06/13/2018 9.4  8.9 - 10.3 mg/dL Final  . GFR calc non Af Amer 06/13/2018 42* >60 mL/min Final  . GFR calc Af Amer 06/13/2018 49* >60 mL/min Final   Comment: (NOTE) The eGFR has been calculated using the CKD EPI equation. This calculation has not been validated in all clinical situations. eGFR's persistently <60 mL/min signify possible Chronic Kidney Disease.   Georgiann Hahn gap 06/13/2018 5  5 - 15 Final   Performed at [facility], [address] [place], [place] [address]  . WBC 06/13/2018 3.5* 4.0 - 10.5 K/uL Final  . RBC 06/13/2018 3.38* 4.22 - 5.81 MIL/uL Final  . Hemoglobin 06/13/2018 11.1* 13.0 - 17.0 g/dL Final  . HCT 06/13/2018 33.1* 39.0 - 52.0 % Final  . MCV 06/13/2018 97.9  80.0 - 100.0 fL Final  . MCH 06/13/2018 32.8  26.0 - 34.0 pg Final  . MCHC 06/13/2018  33.5  30.0 - 36.0 g/dL Final  . RDW 06/13/2018 12.5  11.5 - 15.5 % Final  . Platelets 06/13/2018 171  150 - 400 K/uL Final  . nRBC 06/13/2018 0.0  0.0 - 0.2 % Final  . Neutrophils Relative % 06/13/2018 52  % Final  . Neutro Abs 06/13/2018 1.8  1.7 - 7.7 K/uL Final  . Lymphocytes Relative 06/13/2018 27  % Final  . Lymphs Abs 06/13/2018 0.9  0.7 - 4.0 K/uL Final  . Monocytes Relative 06/13/2018 14  % Final  . Monocytes Absolute 06/13/2018 0.5  0.1 - 1.0 K/uL Final  . Eosinophils Relative 06/13/2018 6  % Final  . Eosinophils Absolute 06/13/2018 0.2  0.0 - 0.5 K/uL Final  . Basophils Relative 06/13/2018 1  % Final  . Basophils Absolute 06/13/2018 0.0  0.0 - 0.1 K/uL Final  . Immature Granulocytes 06/13/2018 0  % Final  . Abs Immature Granulocytes 06/13/2018 0.00  0.00 - 0.07 K/uL Final   Performed at [facility] [address] [place], [place] [address]    Assessment:  The patient is a 84 year old African American gentleman with prostate cancer and a rising PSA on Lupron.  Prostate cancer was discovered approximately 20 years secondary to an elevated PSA.  He underwent biopsy at [facility] (no report available). Initial PSA was 774.    Abdomen and pelvic CT on 01/05/2006 revealed an enlarged and heterogeneous prostate with mass effect on the base of the bladder.  There was no adenopathy.  Chest, abdomen, and pelvic CT on 03/15/2016 revealed moderate centrilobular emphysema.  There were mild lucent mottled appearance of the osseous structures (cannot exclude multiple myeloma).  There were scattered pulmonary nodules (4 mm or less in size).  There was a 2.7 cm low-attenuation left thyroid nodule.     Abdomen and pelvic CT on 12/07/2016 revealed no new or progressive findings.  There was no evidence of lymphadenopathy in the abdomen or pelvis. There was a stable appearance of heterogeneous/mottled bony mineralization.  Abdomen and pelvic CT on 11/27/2017 was stable  with no evidence of adenopathy within the abdomen and pelvis.  There were no suspicious bone lesions.  There was a stable 3 mm RLL nodule.  Work-up on 03/17/2016 revealed no monoclonal protein.  SPEP revealed no monoclonal protein.  Kappa free light chains were 28.9, lambda free light chains 20.7 with a ratio of 1.40 (normal).  Immunoglobulins were normal (IgG 1391, IgA 282,  IgM 29).  24 hour urine revealed no monoclonal protein.  Thyroid ultrasound on 03/22/2016 revealed a solitary 2.9 cm left lower pole nodule.  Thyroid biopsy on 04/14/2016 was benign (Bethesda category II) with abundant foamy macrophages and colloid. The cytologic findings were compatible with a cystic colloid nodule  Bone scan on 04/08/2014, 12/29/2014, 02/23/2016, 12/07/2016, and 11/27/2017 revealed no evidence of metastatic disease.   He is on Lupron and Casodex.  He receives Lupron 30 mg every 4 months (last 12/06/2017).    PSA has been followed: 127.6 on 06/30/2014, 168.3 on 12/02/2014, 63.32 on 02/01/2015, 40.31 on 03/03/2015, 29.12 on 04/08/2015, 11.28 on 07/14/2015, 11.02 on 08/17/2015, 10.35 on 11/15/2015, 14.37 on 02/15/2016, 12.46 on 06/06/2016, 13.5 on 06/23/2016, 16.63 on 09/05/2016, 20.77 on 11/07/2016, 16.41 on 12/11/2016, 26.69 on 02/27/2017, 25.67 on 04/02/2017, 19.16 on 05/10/2017, 18.94 on 07/02/2017, 28.28 on 10/01/2017, 30.23 on 12/06/2017, and 34.56 on 03/14/2018.  He has a normocytic anemia. He was diagnosed with B12 deficiency on 12/24/2014. B12 was 169 (low).  He began B12 supplimentation on 02/01/2015 (last 11/15/2017).  Parietal cell antibody was positive on 07/02/2017.  Folate was 11 on 12/24/2014 and 12 on 06/06/2016.  Normal studies included a CMP, ferritin, iron studies, folate, and TSH. TSH and free T4 were normal on 04/02/2017.   Diet is good. He eats meat 2 times a week. He denies any melena, hematochezia, or hematuria. Last colonoscopy was more than 10 years ago.  Guaiac cards x 3 were  negative in 02/2016.  Echocardiogram on 02/13/2018 demonstrated an EF of 62%. There was mild to moderate TR and mild MR noted. Pressures consistent with borderline PAH. Carotid doppler study on 02/15/2018 demonstrated less than 50% stenosis bilaterally.   Limited abdominal ultrasound on 03/14/2018 revealed a 21.8 x 8.3 x 10.2 cm fatty mass with internal calcifications.  Findings felt to correspond to the CT findings on 11/2017, and not significantly changed from 12/2015 scan.  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  Neoplasm felt to be unlikely.   Symptomatically, patient is doing well.  He denies any acute complaints.  Patient eating well; weight up 1 pound.  Exam reveals trace edema to bilateral ankles.  He has a stable right flank mass.  WBC 3500 (ANC 1800).  Hemoglobin 11.1, hematocrit 33.1, and platelets 171,000.  BUN 22 and creatinine 1.42 mg/dL (CrCl 32.7 mL/min).  PSA stable at 31.24 ng/mL.   Plan: 1. Labs today: CBC with differential, BMP, PSA 2. Prostate cancer  Patient doing well overall.   Continues on Casodex as prescribed.    PSA remains elevated at 31.24 ng/mL (previously 34.56 ng/mL).  Review plans to change therapy from Casodex to abiraterone.  Patient amenable to change in therapy.   PA abiraterone and start process of obtaining financial assistance.   Will confirm plans to change therapy with primary oncologist Mike Gip, MD).   Patient to continue Casodex in the interim.     Lupron was due on 04/03/2018, however patient missed injection.   Lupron today, then every 4 months.  3. Protein calorie malnutrition  His weight today is 144 lb 8 oz (65.5 kg). His BMI of 20.15 kg/m places him in the normal weight category.   Followed by [place], who will see patient today.  Discussed the potential use of appetite stimulating medications.   While these medications carry their own risks and side effects, the benefits outweigh  the risks for this patient.   Patient declines pharmacological intervention  citing the fact that he "eats enough".  Encouraged him to increase his intake of calorie and protein dense food choices.   Encouraged to utilize nutritional supplement shakes at least 2-3 times a day.  4. Right flank mass  Review soft tissue ultrasound. Imaging personally reviewed and felt to be consistent with the dictated radiology report. Imaging reviewed with patient.   21.8 x 8.3 x 10.2 cm fatty mass with internal calcifications.    Stable compared to CT findings on 11/2017, and not significantly changed from 12/2015 scan.    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 5. B12 deficiency  Continues on monthly parenteral B12 supplementation.  B12 injection today, then monthly as previously prescribed. 6. RTC in 3 months for reassessment labs (CBC with, CMP, PSA).  Honor Loh, NP  06/13/2018, 4:57 PM

## 2018-06-13 NOTE — Progress Notes (Signed)
Patient offers no complaints today. 

## 2018-06-13 NOTE — Progress Notes (Signed)
Nutrition Follow-up:   Patient with prostrate cancer and vit b12 deficiency.    Met patient in clinic this pm.  Patient reports that he has been eating.  Reports that he had sausage and eggs this am for breakfast.  Reports he has been eating corn and greens beans some chicken.  Reports he has a pot of pintos cooked.  He does all the cooking.  Typically does not go out to eat.  "Somebody gave me some spaghetti to eat but I haven't eaten it yet."    Drinks boost shakes about 2 times per day but not every day.  Can't tell RD how often he drinks it.    No nutrition impact symptoms reported  Medications: reviewed  Labs: creatinine 1.42  Anthropometrics:   Weight 144 lb 8 oz today in clinic increased from 143 lb weight on 7/25.  Noted at another MD visit 146 lb on 8/13 and 147 lb on 9/16   NUTRITION DIAGNOSIS: Inadequate oral intake continues   MALNUTRITION DIAGNOSIS: severe malnutrition continues   INTERVENTION:  Discussed foods high in calories and protein.  Help make patient a grocery list of high calories foods to purchase and eat. Gave patient 1st case of ensure enlive and encouraged to drink 2-3 times per day.   Discussed adding snack after supper while he is watching ball game.  High calorie snacks discussed.  Recommend adding MVI daily Appetite stimulant may be option if patient continues to decrease weight.    MONITORING, EVALUATION, GOAL: weight trends, intake   NEXT VISIT: 1 month for wt check   Camila Maita B. Zenia Resides, [place], [place] Registered Dietitian [phone] (pager)

## 2018-06-19 ENCOUNTER — Telehealth: Payer: Self-pay | Admitting: Pharmacist

## 2018-06-19 ENCOUNTER — Telehealth: Payer: Self-pay | Admitting: Pharmacy Technician

## 2018-06-19 DIAGNOSIS — C61 Malignant neoplasm of prostate: Secondary | ICD-10-CM

## 2018-06-19 NOTE — Telephone Encounter (Signed)
Oral Oncology Patient Advocate Encounter  Prior Authorization for Charles Flores has been approved.    PA# [id number] Effective dates: 06/19/18 through 08/21/19  Patients co-pay is $3.40  Oral Oncology Clinic will continue to follow.   [place] Patient [place] Phone [phone] Fax [phone] 06/19/2018 11:42 AM

## 2018-06-19 NOTE — Telephone Encounter (Signed)
Oral Oncology Patient Advocate Encounter  Received notification from [place] that prior authorization for Fabio Asa is required.  PA submitted on CoverMyMeds Key [id number] Status is pending  Oral Oncology Clinic will continue to follow.  Charles Flores Patient [place] Phone [phone] Fax [phone] 06/19/2018 11:38 AM

## 2018-06-20 MED ORDER — PREDNISONE 5 MG PO TABS: 5.0000 mg | ORAL_TABLET | Freq: Two times a day (BID) | ORAL | 2 refills | Status: DC

## 2018-06-20 MED ORDER — ABIRATERONE ACETATE 250 MG PO TABS: 1000.0000 mg | ORAL_TABLET | Freq: Every day | ORAL | 2 refills | Status: DC

## 2018-06-20 NOTE — Telephone Encounter (Signed)
Oral Oncology Pharmacist Encounter  Received new prescription for Zytiga (abiraterone) for the treatment of prostate cancer castrate resistant in conjunction with prednisone and Lupron, planned duration until disease progression or unacceptable drug toxicity.  BP from 06/13/18 assessed, BP wnl. Prescription dose and frequency assessed.   Current medication list in Epic reviewed, no DDIs with abiraterone identified.  Prescription has been e-scribed to the [facility] for benefits analysis and approval.  Oral Oncology Clinic will continue to follow for insurance authorization, copayment issues, initial counseling and start date.  Darl Pikes, PharmD, BCPS, [facility] Hematology/Oncology Clinical Pharmacist [facility] Oral [facility] [phone]  06/20/2018 8:34 AM

## 2018-06-24 ENCOUNTER — Telehealth: Payer: Self-pay | Admitting: Urgent Care

## 2018-06-24 NOTE — Telephone Encounter (Signed)
Re: Change in therapy  Spoke with attending oncologist Mike Gip, MD) about change in therapy from Casodex to abiraterone. Last PSA was 31.24 ng/mL on 06/13/2018. MD wishes to proceed with change in therapy.   Oral chemotherapy pharmacy has been made aware. Funding has been secured (see notes).   Attempted several times since last week to call patient. No answer. Called again this afternoon, however still no answer. Will make oral chemotherapy pharmacist aware, as they also need to speak with patient regarding new medication.    Honor Loh, MSN, APRN, FNP-C, CEN Oncology/Hematology Nurse Practitioner  [facility] 06/24/18, 4:32 PM

## 2018-07-01 ENCOUNTER — Other Ambulatory Visit: Payer: Self-pay | Admitting: Urgent Care

## 2018-07-12 ENCOUNTER — Inpatient Hospital Stay: Payer: Medicare Other | Attending: Hematology and Oncology

## 2018-08-06 ENCOUNTER — Ambulatory Visit: Payer: Self-pay | Admitting: Adult Health

## 2018-08-08 ENCOUNTER — Inpatient Hospital Stay: Payer: Medicare Other | Attending: Hematology and Oncology

## 2018-08-08 ENCOUNTER — Inpatient Hospital Stay: Payer: Medicare Other

## 2018-08-08 DIAGNOSIS — D519 Vitamin B12 deficiency anemia, unspecified: Secondary | ICD-10-CM

## 2018-08-08 DIAGNOSIS — E538 Deficiency of other specified B group vitamins: Secondary | ICD-10-CM | POA: Diagnosis not present

## 2018-08-08 MED ORDER — CYANOCOBALAMIN 1000 MCG/ML IJ SOLN
1000.0000 ug | Freq: Once | INTRAMUSCULAR | Status: AC
  Administered 2018-08-08: 1000 ug via INTRAMUSCULAR

## 2018-08-08 NOTE — Progress Notes (Signed)
Nutrition Follow-up:  Patient with prostrate cancer and vit B 12 deficiency.    Met with patient in clinic this pm prior to Vitamin B 12 injection. Patient reports that he is eating.  Reports that typically he eats 2 strips bacon, egg and coffee and juice for breakfast.  Lunch will be vegetables (pinto beans, corn, green beans) sometimes chicken leg or wing.  Supper is usually leftovers from lunch.  Drinking 2 ensure daily.  Has not added snack after supper.  Stays up late and watches sports (basketball now, baseball previously).   Drinks mostly juice.  Reports that nephew lives with him.  He prepares some meals and nephew helps with meals.  Nephew works during the day.     Medications: reviewed (prednisone)  Labs: creatinine 1.42  Anthropometrics:   Weight is 141 lb 8 oz today decreased from 144 lb 8 oz.    143 lb on 7/25 146 lb on 8/13 147 lb on 9/16  4% weight loss in the last 3 months.   NUTRITION DIAGNOSIS: Inadequate oral intake continues   MALNUTRITION DIAGNOSIS: severe malnutrition continues   INTERVENTION:  Encouraged patient to increase ensure enlive to 3 per day.  Gave 2nd case today. Patient planning to drink 3rd ensure after supper for snack.  Talked about adding cheese, gravies, sauces, etc to food to increase calories and protein and stop weight from decreasing. Would recommend appetite stimulant if weight continues to decrease.      MONITORING, EVALUATION, GOAL: weight trends, intake   NEXT VISIT: Jan 16   Lizette Pazos B. Zenia Resides, [place], [place] Registered Dietitian [phone] (pager)

## 2018-08-09 ENCOUNTER — Ambulatory Visit: Payer: Medicare Other | Admitting: Adult Health

## 2018-08-09 ENCOUNTER — Encounter: Payer: Self-pay | Admitting: Adult Health

## 2018-08-09 VITALS — BP 101/69 | HR 79 | Resp 16 | Ht 71.0 in | Wt 144.0 lb

## 2018-08-09 DIAGNOSIS — I951 Orthostatic hypotension: Secondary | ICD-10-CM

## 2018-08-09 DIAGNOSIS — I950 Idiopathic hypotension: Secondary | ICD-10-CM | POA: Diagnosis not present

## 2018-08-09 DIAGNOSIS — R0602 Shortness of breath: Secondary | ICD-10-CM | POA: Diagnosis not present

## 2018-08-09 MED ORDER — MIDODRINE HCL 2.5 MG PO TABS: 2.5000 mg | ORAL_TABLET | Freq: Three times a day (TID) | ORAL | 2 refills | Status: DC

## 2018-08-09 NOTE — Progress Notes (Signed)
[facility] [place], [place] [address]  Internal MEDICINE  Office Visit Note  Patient Name: Charles Flores  [id number]  [id number]  Date of Service: 08/09/2018  Chief Complaint  Patient presents with  . Hyperlipidemia  . Hypothyroidism  . Gastroesophageal Reflux    HPI Pt is here for follow up for HLD, Hypothyroid and GERD. Pt is generally doing well.  Denies any recent issues or hospitalizations.  He denies any recent dizziness, chest pain, shortness of breath, or headaches.  He has been doing well on the Middaugh drain and states that his dizzy episodes are much less frequent.    Current Medication: Outpatient Encounter Medications as of 08/09/2018  Medication Sig  . abiraterone acetate (ZYTIGA) 250 MG tablet Take 4 tablets (1,000 mg total) by mouth daily. Take on an empty stomach 1 hour before or 2 hours after a meal  . bicalutamide (CASODEX) 50 MG tablet TAKE 1 TABLET BY MOUTH EVERY DAY  . leuprolide (LUPRON) 7.5 MG injection Inject 7.5 mg into the muscle once. Per dr Elnoria Howard urology  . midodrine (PROAMATINE) 2.5 MG tablet Take 1 tablet (2.5 mg total) by mouth 3 (three) times daily with meals.  . predniSONE (DELTASONE) 5 MG tablet Take 1 tablet (5 mg total) by mouth 2 (two) times daily with a meal.   No facility-administered encounter medications on file as of 08/09/2018.     Surgical History: Past Surgical History:  Procedure Laterality Date  . PROSTATE BIOPSY      Medical History: Past Medical History:  Diagnosis Date  . Anemia 12/24/2014  . Arthritis   . B12 deficiency anemia 01/01/2015  . Cataract   . Coronary artery disease   . GERD (gastroesophageal reflux disease)   . Hyperlipidemia   . Hyperlipidemia   . Prostate cancer ([place])   . Renal insufficiency 04/15/2017  . Thyroid nodule 03/14/2016    Family History: Family History  Problem Relation Age of Onset  . Diabetes Sister   . Stroke Sister   . Gastric cancer Sister   . Cancer  Mother   . Breast cancer Mother   . Kidney disease Father     Social History   Socioeconomic History  . Marital status: Single    Spouse name: Not on file  . Number of children: Not on file  . Years of education: Not on file  . Highest education level: Not on file  Occupational History  . Not on file  Social Needs  . Financial resource strain: Not on file  . Food insecurity:    Worry: Not on file    Inability: Not on file  . Transportation needs:    Medical: Not on file    Non-medical: Not on file  Tobacco Use  . Smoking status: Former Smoker    Packs/day: 0.50    Years: 15.00    Pack years: 7.50    Types: Cigarettes  . Smokeless tobacco: Never Used  . Tobacco comment: quit 40 years  Substance and Sexual Activity  . Alcohol use: No    Alcohol/week: 0.0 standard drinks  . Drug use: No  . Sexual activity: Yes    Partners: Male  Lifestyle  . Physical activity:    Days per week: Not on file    Minutes per session: Not on file  . Stress: Not on file  Relationships  . Social connections:    Talks on phone: Not on file    Gets together: Not on file  Attends religious service: Not on file    Active member of club or organization: Not on file    Attends meetings of clubs or organizations: Not on file    Relationship status: Not on file  . Intimate partner violence:    Fear of current or ex partner: Not on file    Emotionally abused: Not on file    Physically abused: Not on file    Forced sexual activity: Not on file  Other Topics Concern  . Not on file  Social History Narrative  . Not on file      Review of Systems  Constitutional: Negative.  Negative for chills, fatigue and unexpected weight change.  HENT: Negative.  Negative for congestion, rhinorrhea, sneezing and sore throat.   Eyes: Negative for redness.  Respiratory: Negative.  Negative for cough, chest tightness and shortness of breath.   Cardiovascular: Negative.  Negative for chest pain and  palpitations.  Gastrointestinal: Negative.  Negative for abdominal pain, constipation, diarrhea, nausea and vomiting.  Endocrine: Negative.   Genitourinary: Negative.  Negative for dysuria and frequency.  Musculoskeletal: Negative.  Negative for arthralgias, back pain, joint swelling and neck pain.  Skin: Negative.  Negative for rash.  Allergic/Immunologic: Negative.   Neurological: Negative.  Negative for tremors and numbness.  Hematological: Negative for adenopathy. Does not bruise/bleed easily.  Psychiatric/Behavioral: Negative.  Negative for behavioral problems, sleep disturbance and suicidal ideas. The patient is not nervous/anxious.     Vital Signs: BP 101/69 (BP Location: Right Arm, Patient Position: Sitting, Cuff Size: Normal)   Pulse 79   Resp 16   Ht 5\' 11"  (1.803 m)   Wt 144 lb (65.3 kg)   SpO2 99%   BMI 20.08 kg/m    Physical Exam Vitals signs and nursing note reviewed.  Constitutional:      General: He is not in acute distress.    Appearance: He is well-developed. He is not diaphoretic.  HENT:     Head: Normocephalic and atraumatic.     Mouth/Throat:     Pharynx: No oropharyngeal exudate.  Eyes:     Pupils: Pupils are equal, round, and reactive to light.  Neck:     Musculoskeletal: Normal range of motion and neck supple.     Thyroid: No thyromegaly.     Vascular: No JVD.     Trachea: No tracheal deviation.  Cardiovascular:     Rate and Rhythm: Normal rate and regular rhythm.     Heart sounds: Normal heart sounds. No murmur. No friction rub. No gallop.   Pulmonary:     Effort: Pulmonary effort is normal. No respiratory distress.     Breath sounds: Normal breath sounds. No wheezing or rales.  Chest:     Chest wall: No tenderness.  Abdominal:     Palpations: Abdomen is soft.     Tenderness: There is no abdominal tenderness. There is no guarding.  Musculoskeletal: Normal range of motion.  Lymphadenopathy:     Cervical: No cervical adenopathy.  Skin:     General: Skin is warm and dry.  Neurological:     Mental Status: He is alert and oriented to person, place, and time.     Cranial Nerves: No cranial nerve deficit.  Psychiatric:        Behavior: Behavior normal.        Thought Content: Thought content normal.        Judgment: Judgment normal.     Assessment/Plan: 1. Orthostatic hypotension Refill patient's Midrin  prescription at that time.  Patient's dizziness seems to be improved with using Midrin.  We will continue him on this medication at this time.  Patient's blood pressure today 101 over second. - midodrine (PROAMATINE) 2.5 MG tablet; Take 1 tablet (2.5 mg total) by mouth 3 (three) times daily with meals.  Dispense: 270 tablet; Refill: 2  2. Idiopathic hypotension Patient will continue to use Middaugh drain as prescribed if he has any more issues of low blood pressure with dizziness or fatigue he is to return to clinic.  3. SOB (shortness of breath) Patient denies any recent shortness of breath.  He denies any other complaints.  General Counseling: ahlijah raia understanding of the findings of todays visit and agrees with plan of treatment. I have discussed any further diagnostic evaluation that may be needed or ordered today. We also reviewed his medications today. he has been encouraged to call the office with any questions or concerns that should arise related to todays visit.    No orders of the defined types were placed in this encounter.   No orders of the defined types were placed in this encounter.   Time spent: 25 Minutes   This patient was seen by Orson Gear AGNP-C in Collaboration with Dr Lavera Guise as a part of collaborative care agreement     Kendell Bane AGNP-C Internal medicine

## 2018-08-19 ENCOUNTER — Other Ambulatory Visit: Payer: Self-pay | Admitting: Pharmacist

## 2018-08-19 NOTE — Telephone Encounter (Signed)
Oral Chemotherapy Pharmacist Encounter   Unable to reach patient for initial Zytiga fill. Will attempt to catch patient at his next scheduled office visit.   Darl Pikes, PharmD, BCPS, [facility] Hematology/Oncology Clinical Pharmacist [facility]/AP [facility] [place] [facility] [phone]  08/19/2018 9:16 AM

## 2018-09-05 ENCOUNTER — Telehealth: Payer: Self-pay | Admitting: Pharmacy Technician

## 2018-09-05 ENCOUNTER — Telehealth: Payer: Self-pay | Admitting: Pharmacist

## 2018-09-05 ENCOUNTER — Other Ambulatory Visit: Payer: Self-pay

## 2018-09-05 ENCOUNTER — Inpatient Hospital Stay: Payer: Medicare Other

## 2018-09-05 ENCOUNTER — Inpatient Hospital Stay (HOSPITAL_BASED_OUTPATIENT_CLINIC_OR_DEPARTMENT_OTHER): Payer: Medicare Other | Admitting: Oncology

## 2018-09-05 ENCOUNTER — Inpatient Hospital Stay: Payer: Medicare Other | Attending: Oncology

## 2018-09-05 ENCOUNTER — Encounter: Payer: Self-pay | Admitting: Oncology

## 2018-09-05 VITALS — BP 121/75 | HR 90 | Temp 96.8°F | Resp 18 | Wt 144.9 lb

## 2018-09-05 DIAGNOSIS — C61 Malignant neoplasm of prostate: Secondary | ICD-10-CM | POA: Insufficient documentation

## 2018-09-05 DIAGNOSIS — Z87891 Personal history of nicotine dependence: Secondary | ICD-10-CM

## 2018-09-05 DIAGNOSIS — E46 Unspecified protein-calorie malnutrition: Secondary | ICD-10-CM

## 2018-09-05 DIAGNOSIS — E538 Deficiency of other specified B group vitamins: Secondary | ICD-10-CM | POA: Diagnosis not present

## 2018-09-05 DIAGNOSIS — D519 Vitamin B12 deficiency anemia, unspecified: Secondary | ICD-10-CM

## 2018-09-05 DIAGNOSIS — E291 Testicular hypofunction: Secondary | ICD-10-CM

## 2018-09-05 DIAGNOSIS — Z79818 Long term (current) use of other agents affecting estrogen receptors and estrogen levels: Secondary | ICD-10-CM | POA: Diagnosis not present

## 2018-09-05 LAB — COMPREHENSIVE METABOLIC PANEL
ALT: 10 U/L (ref 0–44)
AST: 16 U/L (ref 15–41)
Albumin: 4.1 g/dL (ref 3.5–5.0)
Alkaline Phosphatase: 55 U/L (ref 38–126)
Anion gap: 8 (ref 5–15)
BILIRUBIN TOTAL: 1 mg/dL (ref 0.3–1.2)
BUN: 25 mg/dL — ABNORMAL HIGH (ref 8–23)
CO2: 26 mmol/L (ref 22–32)
Calcium: 9.3 mg/dL (ref 8.9–10.3)
Chloride: 107 mmol/L (ref 98–111)
Creatinine, Ser: 1.55 mg/dL — ABNORMAL HIGH (ref 0.61–1.24)
GFR calc Af Amer: 45 mL/min — ABNORMAL LOW (ref >60)
GFR calc non Af Amer: 39 mL/min — ABNORMAL LOW (ref >60)
Glucose, Bld: 93 mg/dL (ref 70–99)
Potassium: 4.3 mmol/L (ref 3.5–5.1)
Sodium: 141 mmol/L (ref 135–145)
TOTAL PROTEIN: 7.2 g/dL (ref 6.5–8.1)

## 2018-09-05 LAB — CBC WITH DIFFERENTIAL/PLATELET
Abs Immature Granulocytes: 0 10*3/uL (ref 0.00–0.07)
Basophils Absolute: 0.1 10*3/uL (ref 0.0–0.1)
Basophils Relative: 1 %
Eosinophils Absolute: 0.2 10*3/uL (ref 0.0–0.5)
Eosinophils Relative: 4 %
HCT: 32.9 % — ABNORMAL LOW (ref 39.0–52.0)
Hemoglobin: 11 g/dL — ABNORMAL LOW (ref 13.0–17.0)
Immature Granulocytes: 0 %
Lymphocytes Relative: 30 %
Lymphs Abs: 1.3 10*3/uL (ref 0.7–4.0)
MCH: 33 pg (ref 26.0–34.0)
MCHC: 33.4 g/dL (ref 30.0–36.0)
MCV: 98.8 fL (ref 80.0–100.0)
Monocytes Absolute: 0.5 10*3/uL (ref 0.1–1.0)
Monocytes Relative: 13 %
Neutro Abs: 2.1 10*3/uL (ref 1.7–7.7)
Neutrophils Relative %: 52 %
PLATELETS: 176 10*3/uL (ref 150–400)
RBC: 3.33 MIL/uL — ABNORMAL LOW (ref 4.22–5.81)
RDW: 13.1 % (ref 11.5–15.5)
WBC: 4.1 10*3/uL (ref 4.0–10.5)
nRBC: 0 % (ref 0.0–0.2)

## 2018-09-05 LAB — PSA: Prostatic Specific Antigen: 73.04 ng/mL — ABNORMAL HIGH (ref 0.00–4.00)

## 2018-09-05 MED ORDER — ABIRATERONE ACETATE 250 MG PO TABS: 1000.0000 mg | ORAL_TABLET | Freq: Every day | ORAL | 0 refills | Status: DC

## 2018-09-05 MED ORDER — CYANOCOBALAMIN 1000 MCG/ML IJ SOLN
1000.0000 ug | Freq: Once | INTRAMUSCULAR | Status: AC
  Administered 2018-09-05: 1000 ug via INTRAMUSCULAR
  Filled 2018-09-05: qty 1

## 2018-09-05 MED ORDER — PREDNISONE 5 MG PO TABS: 5.0000 mg | ORAL_TABLET | Freq: Every day | ORAL | 3 refills | Status: DC

## 2018-09-05 NOTE — Progress Notes (Signed)
Patient here for follow up. Transferring care from Dr. Mike Gip to Dr. Tasia Catchings.  Pt states he has not taken casodex in over a month, "they wouldn't refill it, I dont know why."

## 2018-09-05 NOTE — Progress Notes (Signed)
Nutrition Follow-up:  Patient with prostrate cancer and vit b 12 deficiency now followed by Dr. Tasia Catchings.   Noted patient has not taken casodex in over a month.    Met with patient in clinic this am.  Patient reports appetite is good.  "I have been drinking those drinks you gave me about 3 times per day (10am, 2pm and after supper).  Reports that he is eating breakfast sometimes a Consulting civil engineer egg, cheese and sausage biscuit.  Lunch and supper is still beans, sometimes chicken, macaroni and cheese.    Denies any nutrition impact symptoms at this time.    Medications: reviewed  Labs: BUN 22, creatinine 1.55  Anthropometrics:   Weight is 144 lb 14.4 oz today increased from 141 lb 8 oz on 12/19 last RD visit  143 lb on 7/25 146 lb on 8/13 147 lb on 9/16   NUTRITION DIAGNOSIS: Inadequate oral intake improving   MALNUTRITION DIAGNOSIS: severe malnutrition improving   INTERVENTION:  Encouraged patient to continue drinking 3 ensure enlive per day.  3rd case of ensure given to patient today with coupons Discussed continuing to eat foods high in calories and protein.  Examples provided.  Discussed easy to prepare foods.      MONITORING, EVALUATION, GOAL: weight trends, intake   NEXT VISIT: Thursday, Feb 27   Jazmarie Biever B. Zenia Resides, [place], [place] Registered Dietitian [phone] (pager)

## 2018-09-05 NOTE — Telephone Encounter (Signed)
Oral Oncology Patient Advocate Encounter   Was successful in securing patient a $3500 grant from [employer] (PAF) to provide copayment coverage for Zytiga & Prednisone.  This will keep the out of pocket expense at $0.     I have spoken with the patient.    The billing information is as follows and has been shared with [place].   RxBin: [id number] PCN:  [id number] Member ID: [id number] Group ID: [id number] Dates of Eligibility: 09/05/2018 through 09/05/2019  [place] Patient [place] Phone [phone] Fax [phone] 09/05/2018 11:13 AM

## 2018-09-05 NOTE — Progress Notes (Signed)
[facility] day:  09/05/2018    Chief Complaint: Charles Flores is an 85 y.o. male with prostate cancer and B12 deficiency who is seen for  3 month assessment.  Pertinent oncology history Patient previously follows up with Dr. Mike Gip.  He switched care to me on 09/05/2018. The patient is a 85 year old African American gentleman with prostate cancer and a rising PSA on Lupron.  Prostate cancer was discovered approximately 20 years secondary to an elevated PSA.  He underwent biopsy at [facility] (no report available). Initial PSA was 774.   I have personally reviewed the radiological images as listed and agreed with the findings in the report. Abdomen and pelvic CT on 01/05/2006 revealed an enlarged and heterogeneous prostate with mass effect on the base of the bladder.  There was no adenopathy.  Chest, abdomen, and pelvic CT on 03/15/2016 revealed moderate centrilobular emphysema.  There were mild lucent mottled appearance of the osseous structures (cannot exclude multiple myeloma).  There were scattered pulmonary nodules (4 mm or less in size).  There was a 2.7 cm low-attenuation left thyroid nodule.    Abdomen and pelvic CT on 12/07/2016 revealed no new or progressive findings.  There was no evidence of lymphadenopathy in the abdomen or pelvis. There was a stable appearance of heterogeneous/mottled bony mineralization.  Abdomen and pelvic CT on 11/27/2017 was stable with no evidence of adenopathy within the abdomen and pelvis.  There were no suspicious bone lesions.  There was a stable 3 mm RLL nodule.  Work-up on 03/17/2016 revealed no monoclonal protein.  SPEP revealed no monoclonal protein.  Kappa free light chains were 28.9, lambda free light chains 20.7 with a ratio of 1.40 (normal).  Immunoglobulins were normal (IgG 1391, IgA 282, IgM 29).  24 hour urine revealed no monoclonal protein. Thyroid ultrasound on 03/22/2016 revealed a  solitary 2.9 cm left lower pole nodule.  Thyroid biopsy on 04/14/2016 was benign (Bethesda category II) with abundant foamy macrophages and colloid. The cytologic findings were compatible with a cystic colloid nodule  Bone scan on 04/08/2014, 12/29/2014, 02/23/2016, 12/07/2016, and 11/27/2017 revealed no evidence of metastatic disease.  Patient has been on Lupron and Casodex.  He receives Lupron 30 mg every 4 months (last 12/06/2017).    PSA has been followed: 127.6 on 06/30/2014, 168.3 on 12/02/2014, 63.32 on 02/01/2015, 40.31 on 03/03/2015, 29.12 on 04/08/2015, 11.28 on 07/14/2015, 11.02 on 08/17/2015, 10.35 on 11/15/2015, 14.37 on 02/15/2016, 12.46 on 06/06/2016, 13.5 on 06/23/2016, 16.63 on 09/05/2016, 20.77 on 11/07/2016, 16.41 on 12/11/2016, 26.69 on 02/27/2017, 25.67 on 04/02/2017, 19.16 on 05/10/2017, 18.94 on 07/02/2017, 28.28 on 10/01/2017, 30.23 on 12/06/2017, and 34.56 on 03/14/2018.  He has a normocytic anemia. He was diagnosed with B12 deficiency on 12/24/2014. B12 was 169 (low).  He began B12 supplimentation on 02/01/2015 (last 11/15/2017).  Parietal cell antibody was positive on 07/02/2017.  Folate was 11 on 12/24/2014 and 12 on 06/06/2016.  Normal studies included a CMP, ferritin, iron studies, folate, and TSH. TSH and free T4 were normal on 04/02/2017.   # Limited abdominal ultrasound was done on 03/14/2018 revealed a 21.8 x 8.3 x 10.2 cm fatty mass with internal calcifications.  Findings felt to correspond to the CT findings on 11/2017, and not significantly changed from 12/2015 scan.  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  Neoplasm felt to be unlikely.  INTERVAL HISTORY Darivs Flores is a 85 y.o.  male who has above history reviewed by me today presents for follow up visit for management of prostate cancer. Patient has been on Lupron and Casodex. PSA continues to rise.  At last visit, decision was made to switch to [facility]. Our pharmacist were  not able to contact patient so patient did not start [place]. He reports feeling well. .  No new complaints.  Denies any chest pain, shortness of breath, abdominal pain, bone pain. Weight has been stable since his visit on 06/13/2018.  Patient continues on monthly parenteral B12 supplementation.  Last injection was received on 08/08/2018 He is also on Lupron injection.  Lupron 30 mg was injected on 06/13/2018.    Past Medical History:  Diagnosis Date  . Anemia 12/24/2014  . Arthritis   . B12 deficiency anemia 01/01/2015  . Cataract   . Coronary artery disease   . GERD (gastroesophageal reflux disease)   . Hyperlipidemia   . Hyperlipidemia   . Prostate cancer ([place])   . Renal insufficiency 04/15/2017  . Thyroid nodule 03/14/2016    Past Surgical History:  Procedure Laterality Date  . PROSTATE BIOPSY      Family History  Problem Relation Age of Onset  . Diabetes Sister   . Stroke Sister   . Gastric cancer Sister   . Cancer Mother   . Breast cancer Mother   . Kidney disease Father    Social History   Socioeconomic History  . Marital status: Single    Spouse name: Not on file  . Number of children: Not on file  . Years of education: Not on file  . Highest education level: Not on file  Occupational History  . Not on file  Social Needs  . Financial resource strain: Not on file  . Food insecurity:    Worry: Not on file    Inability: Not on file  . Transportation needs:    Medical: Not on file    Non-medical: Not on file  Tobacco Use  . Smoking status: Former Smoker    Packs/day: 0.50    Years: 15.00    Pack years: 7.50    Types: Cigarettes  . Smokeless tobacco: Never Used  . Tobacco comment: quit 40 years  Substance and Sexual Activity  . Alcohol use: No    Alcohol/week: 0.0 standard drinks  . Drug use: No  . Sexual activity: Yes    Partners: Male  Lifestyle  . Physical activity:    Days per week: Not on file    Minutes per session: Not on file  . Stress:  Not on file  Relationships  . Social connections:    Talks on phone: Not on file    Gets together: Not on file    Attends religious service: Not on file    Active member of club or organization: Not on file    Attends meetings of clubs or organizations: Not on file    Relationship status: Not on file  . Intimate partner violence:    Fear of current or ex partner: Not on file    Emotionally abused: Not on file    Physically abused: Not on file    Forced sexual activity: Not on file  Other Topics Concern  . Not on file  Social History Narrative  . Not on file     Allergies: No Known Allergies  Current Outpatient Medications  Medication Sig Dispense Refill  . bicalutamide (CASODEX) 50 MG tablet TAKE 1 TABLET BY MOUTH EVERY DAY  90 tablet 0  . leuprolide (LUPRON) 7.5 MG injection Inject 7.5 mg into the muscle once. Per dr Elnoria Howard urology    . midodrine (PROAMATINE) 2.5 MG tablet Take 1 tablet (2.5 mg total) by mouth 3 (three) times daily with meals. 270 tablet 2   No current facility-administered medications for this visit.    Review of Systems  Constitutional: Negative for appetite change, chills and fatigue.  HENT:   Negative for hearing loss and voice change.   Eyes: Negative for eye problems and icterus.  Respiratory: Negative for chest tightness, cough and shortness of breath.   Cardiovascular: Positive for leg swelling. Negative for chest pain.  Gastrointestinal: Negative for abdominal distention and abdominal pain.  Endocrine: Negative for hot flashes.  Genitourinary: Negative for difficulty urinating, dysuria and frequency.   Musculoskeletal: Negative for arthralgias.  Skin: Negative for itching and rash.  Neurological: Negative for light-headedness and numbness.  Hematological: Negative for adenopathy. Does not bruise/bleed easily.  Psychiatric/Behavioral: Negative for confusion.    Performance status (ECOG): 1 - Symptomatic but completely ambulatory  Vital Signs BP  121/75 (BP Location: Left Arm)   Pulse 90   Temp (!) 96.8 F (36 C) (Tympanic)   Resp 18   Wt 144 lb 14.4 oz (65.7 kg)   BMI 20.21 kg/m   Physical Exam  Constitutional: He is oriented to person, place, and time. He appears healthy. No distress.  Thin gentleman  HENT:  Head: Normocephalic and atraumatic.  Nose: Nose normal.  Mouth/Throat: Oropharynx is clear and moist and mucous membranes are normal. He has dentures. No oropharyngeal exudate.  Eyes: Pupils are equal, round, and reactive to light. EOM are normal. No scleral icterus.  Brown eyes.  Slight ptosis.  Neck: Normal range of motion. Neck supple. No tracheal deviation present. No thyromegaly present.  Cardiovascular: Normal rate, regular rhythm, normal heart sounds and intact distal pulses. Exam reveals no gallop and no friction rub.  No murmur heard. Pulmonary/Chest: Effort normal and breath sounds normal. No respiratory distress. He has no wheezes. He has no rales. He exhibits no tenderness.  Abdominal: Soft. Bowel sounds are normal. He exhibits no distension. Mass: Large right flank mass: Previously ultrasound consistent with a lipomatous growth fat necrosis and resultant calcification. There is no abdominal tenderness.  Musculoskeletal: Normal range of motion.        General: No tenderness or edema.  Lymphadenopathy:    He has no cervical adenopathy.    He has no axillary adenopathy.       Right: No inguinal and no supraclavicular adenopathy present.       Left: No inguinal and no supraclavicular adenopathy present.  Neurological: He is alert and oriented to person, place, and time. No cranial nerve deficit. He exhibits normal muscle tone. Coordination normal.  Skin: Skin is warm and dry. No rash noted. He is not diaphoretic. No erythema.  Psychiatric: Mood, affect and judgment normal.  Nursing note and vitals reviewed.  I have reviewed all the lab results. There are some abnormalities that are not critical to the  patient's health, but I would like to discuss these in person at an office appointment. Please ask him to schedule a follow up visit with me at his convenience. CBC    Component Value Date/Time   WBC 4.1 09/05/2018 0937   RBC 3.33 (L) 09/05/2018 0937   HGB 11.0 (L) 09/05/2018 0937   HCT 32.9 (L) 09/05/2018 0937   PLT 176 09/05/2018 0937   MCV 98.8  09/05/2018 0937   MCH 33.0 09/05/2018 0937   MCHC 33.4 09/05/2018 0937   RDW 13.1 09/05/2018 0937   LYMPHSABS 1.3 09/05/2018 0937   MONOABS 0.5 09/05/2018 0937   EOSABS 0.2 09/05/2018 0937   BASOSABS 0.1 09/05/2018 0937   CMP Latest Ref Rng & Units 09/05/2018 06/13/2018 03/14/2018  Glucose 70 - 99 mg/dL 93 94 105(H)  BUN 8 - 23 mg/dL 25(H) 22 26(H)  Creatinine 0.61 - 1.24 mg/dL 1.55(H) 1.42(H) 1.51(H)  Sodium 135 - 145 mmol/L 141 138 138  Potassium 3.5 - 5.1 mmol/L 4.3 4.2 4.5  Chloride 98 - 111 mmol/L 107 107 106  CO2 22 - 32 mmol/L '26 26 22  ' Calcium 8.9 - 10.3 mg/dL 9.3 9.4 9.5  Total Protein 6.5 - 8.1 g/dL 7.2 - -  Total Bilirubin 0.3 - 1.2 mg/dL 1.0 - -  Alkaline Phos 38 - 126 U/L 55 - -  AST 15 - 41 U/L 16 - -  ALT 0 - 44 U/L 10 - -    Assessment and Plan 85 y.o. male present for management of prostate cancer and vitamin B12 deficiency. 1. Prostate cancer ([place])   2. B12 deficiency   3. Protein-calorie malnutrition, unspecified severity ([place])   4. Androgen deprivation therapy    #Prostate cancer, no image evidence of metastatic disease. Rising PSA with short doubling time.  PSA increased to 73.04 today.  Agree with switching to Zytiga plus prednisone 21m BID. Rationale and side effects of Zytiga+ prednisone discussed with patient.  He also met pharmacist and had a discussion as well. Advised patient to stop Casodex.  Patient reports that Casodex has not been reviewed so he has not been taking it.  #Vitamin B12 deficiency with positive antiparietal antibody. Proceed with monthly B12 injection today.  Repeat B12 level at next  visit.  #Androgen deprivation therapy, last Lupron 30 mg was given on 06/13/2018.  Due on 10/14/2018.  #Protein calorie malnutrition, he follows up with dietitian.  Weight has been stable.  Total face to face encounter time for this patient visit was 25 min. >50% of the time was  spent in counseling and coordination of care.  ZEarlie Server MD, PhD Hematology Oncology CCentral Ma Ambulatory Endoscopy Centerat AEnloe Rehabilitation CenterPager- [phone]/16/2020

## 2018-09-06 LAB — TESTOSTERONE: Testosterone: <3 ng/dL — ABNORMAL LOW (ref 264–916)

## 2018-09-06 MED ORDER — ABIRATERONE ACETATE 250 MG PO TABS: 750.0000 mg | ORAL_TABLET | Freq: Every day | ORAL | 0 refills | Status: DC

## 2018-09-06 MED ORDER — PREDNISONE 5 MG PO TABS: 5.0000 mg | ORAL_TABLET | Freq: Two times a day (BID) | ORAL | 3 refills | Status: DC

## 2018-09-06 MED FILL — ABIRATERONE ACETATE 250 MG: 250 | 30 days supply | Qty: 90 | Fill #0

## 2018-09-06 NOTE — Telephone Encounter (Signed)
Oral Chemotherapy Pharmacist Encounter  Patient Education I spoke with patient following his OV for overview of new oral chemotherapy medication: Zytiga (abiraterone) for the treatment of prostate cancer castrate resistant in conjunction with prednisone and Lupron, planned duration until disease progression or unacceptable drug toxicity.   Pt is doing well. Counseled patient on administration, dosing, side effects, monitoring, drug-food interactions, safe handling, storage, and disposal. Patient will take Zytiga 3 tablets (750 mg total) by mouth daily. Take on an empty stomach 1 hour before or 2 hours after a meal.  He will also take prednisone 1 tablet (5 mg total) by mouth 2 (two) times daily with a meal.  Side effects include but not limited to: HTN, fatigue, hot flashes, decreased wbc.    Reviewed with patient importance of keeping a medication schedule and plan for any missed doses.  Mr. Vanblarcom voiced understanding and appreciation. All questions answered. Medication handout provided and consent obtained.  Provided patient with [facility] phone number. Patient knows to call the office with questions or concerns. Oral Chemotherapy Navigation Clinic will continue to follow.  Darl Pikes, PharmD, BCPS, [facility] Hematology/Oncology Clinical Pharmacist [facility]/AP [facility] [place] [facility] [phone]  09/06/2018 10:00 AM

## 2018-09-27 ENCOUNTER — Encounter: Payer: Self-pay | Admitting: Oncology

## 2018-09-27 ENCOUNTER — Inpatient Hospital Stay: Payer: Medicare Other | Attending: Oncology

## 2018-09-27 ENCOUNTER — Other Ambulatory Visit: Payer: Self-pay

## 2018-09-27 ENCOUNTER — Other Ambulatory Visit: Payer: Self-pay | Admitting: Oncology

## 2018-09-27 ENCOUNTER — Inpatient Hospital Stay (HOSPITAL_BASED_OUTPATIENT_CLINIC_OR_DEPARTMENT_OTHER): Payer: Medicare Other | Admitting: Oncology

## 2018-09-27 VITALS — BP 120/75 | HR 81 | Temp 96.4°F | Resp 18 | Wt 148.3 lb

## 2018-09-27 DIAGNOSIS — E538 Deficiency of other specified B group vitamins: Secondary | ICD-10-CM | POA: Diagnosis not present

## 2018-09-27 DIAGNOSIS — N183 Chronic kidney disease, stage 3 (moderate): Secondary | ICD-10-CM | POA: Diagnosis not present

## 2018-09-27 DIAGNOSIS — D696 Thrombocytopenia, unspecified: Secondary | ICD-10-CM | POA: Insufficient documentation

## 2018-09-27 DIAGNOSIS — D631 Anemia in chronic kidney disease: Secondary | ICD-10-CM | POA: Insufficient documentation

## 2018-09-27 DIAGNOSIS — Z5181 Encounter for therapeutic drug level monitoring: Secondary | ICD-10-CM

## 2018-09-27 DIAGNOSIS — C61 Malignant neoplasm of prostate: Secondary | ICD-10-CM

## 2018-09-27 DIAGNOSIS — Z87891 Personal history of nicotine dependence: Secondary | ICD-10-CM | POA: Insufficient documentation

## 2018-09-27 DIAGNOSIS — Z79818 Long term (current) use of other agents affecting estrogen receptors and estrogen levels: Secondary | ICD-10-CM

## 2018-09-27 DIAGNOSIS — Z5111 Encounter for antineoplastic chemotherapy: Secondary | ICD-10-CM

## 2018-09-27 LAB — CBC WITH DIFFERENTIAL/PLATELET
Abs Immature Granulocytes: 0.01 10*3/uL (ref 0.00–0.07)
Basophils Absolute: 0 10*3/uL (ref 0.0–0.1)
Basophils Relative: 0 %
Eosinophils Absolute: 0 10*3/uL (ref 0.0–0.5)
Eosinophils Relative: 1 %
HCT: 32.9 % — ABNORMAL LOW (ref 39.0–52.0)
Hemoglobin: 10.9 g/dL — ABNORMAL LOW (ref 13.0–17.0)
Immature Granulocytes: 0 %
LYMPHS PCT: 16 %
Lymphs Abs: 0.8 10*3/uL (ref 0.7–4.0)
MCH: 33 pg (ref 26.0–34.0)
MCHC: 33.1 g/dL (ref 30.0–36.0)
MCV: 99.7 fL (ref 80.0–100.0)
Monocytes Absolute: 0.4 10*3/uL (ref 0.1–1.0)
Monocytes Relative: 8 %
Neutro Abs: 3.8 10*3/uL (ref 1.7–7.7)
Neutrophils Relative %: 75 %
Platelets: 134 10*3/uL — ABNORMAL LOW (ref 150–400)
RBC: 3.3 MIL/uL — ABNORMAL LOW (ref 4.22–5.81)
RDW: 13.2 % (ref 11.5–15.5)
WBC: 5 10*3/uL (ref 4.0–10.5)
nRBC: 0 % (ref 0.0–0.2)

## 2018-09-27 LAB — COMPREHENSIVE METABOLIC PANEL
ALT: 13 U/L (ref 0–44)
AST: 17 U/L (ref 15–41)
Albumin: 4 g/dL (ref 3.5–5.0)
Alkaline Phosphatase: 65 U/L (ref 38–126)
Anion gap: 4 — ABNORMAL LOW (ref 5–15)
BUN: 26 mg/dL — ABNORMAL HIGH (ref 8–23)
CHLORIDE: 107 mmol/L (ref 98–111)
CO2: 28 mmol/L (ref 22–32)
Calcium: 9.1 mg/dL (ref 8.9–10.3)
Creatinine, Ser: 1.49 mg/dL — ABNORMAL HIGH (ref 0.61–1.24)
GFR calc Af Amer: 48 mL/min — ABNORMAL LOW (ref >60)
GFR calc non Af Amer: 41 mL/min — ABNORMAL LOW (ref >60)
Glucose, Bld: 102 mg/dL — ABNORMAL HIGH (ref 70–99)
Potassium: 3.9 mmol/L (ref 3.5–5.1)
SODIUM: 139 mmol/L (ref 135–145)
Total Bilirubin: 0.9 mg/dL (ref 0.3–1.2)
Total Protein: 7.1 g/dL (ref 6.5–8.1)

## 2018-09-27 LAB — VITAMIN B12: Vitamin B-12: 361 pg/mL (ref 180–914)

## 2018-09-27 LAB — PSA: PROSTATIC SPECIFIC ANTIGEN: 22.78 ng/mL — AB (ref 0.00–4.00)

## 2018-09-27 MED ORDER — OMEPRAZOLE 20 MG PO CPDR: 20.0000 mg | DELAYED_RELEASE_CAPSULE | Freq: Every day | ORAL | 2 refills | Status: DC

## 2018-09-27 NOTE — Progress Notes (Signed)
add

## 2018-09-27 NOTE — Telephone Encounter (Signed)
Hey. I don't know this patient. Are you familiar with this?

## 2018-09-27 NOTE — Telephone Encounter (Signed)
)   Ref Range & Units 09:45 3wk ago 74mo ago 1mo ago 89mo ago 83mo ago 31mo ago  Sodium 135 - 145 mmol/L 139  141  138  138  136  138  134Low    Potassium 3.5 - 5.1 mmol/L 3.9  4.3  4.2  4.5  4.2  4.4  4.2   Chloride 98 - 111 mmol/L 107  107  107  106  107 R 104 R 106 R  CO2 22 - 32 mmol/L 28  26  26  22  22  26  25    Glucose, Bld 70 - 99 mg/dL 102High   93  94  105High   95 R 98 R 103High  R  BUN 8 - 23 mg/dL 26High   25High   22  26High   22High  R 16 R 19 R  Creatinine, Ser 0.61 - 1.24 mg/dL 1.49High   1.55High   1.42High   1.51High   1.39High   1.15  1.27High    Calcium 8.9 - 10.3 mg/dL 9.1  9.3  9.4  9.5  9.6  9.2  9.0   Total Protein 6.5 - 8.1 g/dL 7.1  7.2      7.2   Albumin 3.5 - 5.0 g/dL 4.0  4.1      4.1   AST 15 - 41 U/L 17  16      15    ALT 0 - 44 U/L 13  10      8Low  R  Alkaline Phosphatase 38 - 126 U/L 65  55      66   Total Bilirubin 0.3 - 1.2 mg/dL 0.9  1.0      0.7   GFR calc non Af Amer >60 mL/min 41Low   39Low   42Low   39Low   44Low   55Low   49Low    GFR calc Af Amer >60 mL/min 48Low   45Low   49Low  CM 45Low  CM 51Low  CM >60 CM 56Low  CM  Anion gap 5 - 15 4Low   8 CM 5 CM 10 CM 7 CM 8 CM 3Low  CM  Comment: Performed at [facility], [address] [place], [place] [address]  Resulting Agency  [facility] CLIN LAB [place] CLIN LAB [place] CLIN LAB [place] CLIN LAB [place] CLIN LAB [place] CLIN LAB [place] [facility] LAB      Specimen Collected: 09/27/18 09:45  Last Resulted: 09/27/18 10:15     Lab Flowsheet    Order Details    View Encounter    Lab and Collection Details    Routing    Result History      CM=Additional commentsR=Reference range differs from displayed range      Other Results from 09/27/2018   PSA  Order: [id number]   Status:  In process  Visible to patient:  No (Not Released)  Next appt:  10/17/2018 at 02:00 PM in Oncology ([facility] INJECTION)  Dx:  Prostate cancer [facility])      Specimen Collected: 09/27/18 09:45  Last Resulted: 09/27/18 09:46     Order Details     View Encounter    Lab and Collection Details    Routing    Result History            Contains abnormal data CBC with Differential/Platelet  Order: [id number]   Status:  Final result  Visible to patient:  No (Not Released)  Next appt:  10/17/2018 at  02:00 PM in Oncology ([facility] INJECTION)  Dx:  Prostate cancer ([place])   Ref Range & Units 09:45 3wk ago 67mo ago 31mo ago 26mo ago 69mo ago 66mo ago  WBC 4.0 - 10.5 K/uL 5.0  4.1  3.5Low   3.3Low  R 4.1 R 3.8 R 3.5Low  R  RBC 4.22 - 5.81 MIL/uL 3.30Low   3.33Low   3.38Low   3.54Low  R 3.52Low  R 3.65Low  R 3.61Low  R  Hemoglobin 13.0 - 17.0 g/dL 10.9Low   11.0Low   11.1Low   11.9Low  R 11.9Low  R 12.3Low  R 11.9Low  R  HCT 39.0 - 52.0 % 32.9Low   32.9Low   33.1Low   35.1Low  R 34.2Low  R 35.7Low  R 35.3Low  R  MCV 80.0 - 100.0 fL 99.7  98.8  97.9  99.0  97.2  97.9  97.8   MCH 26.0 - 34.0 pg 33.0  33.0  32.8  33.7  33.8  33.8  32.9   MCHC 30.0 - 36.0 g/dL 33.1  33.4  33.5  34.1 R 34.8 R 34.5 R 33.6 R  RDW 11.5 - 15.5 % 13.2  13.1  12.5  13.6 R 13.7 R 13.9 R 14.0 R  Platelets 150 - 400 K/uL 134Low   176  171  186 R 194 R 183 R 186 R  nRBC 0.0 - 0.2 % 0.0  0.0  0.0       Neutrophils Relative % % 75  52  52  52  62  57  50   Neutro Abs 1.7 - 7.7 K/uL 3.8  2.1  1.8  1.7 R 2.5 R 2.2 R 1.7 R  Lymphocytes Relative % 16  30  27  30  23  26  31    Lymphs Abs 0.7 - 4.0 K/uL 0.8  1.3  0.9  1.0 R 0.9Low  R 1.0 R 1.1 R  Monocytes Relative % 8  13  14  12  11  12  13    Monocytes Absolute 0.1 - 1.0 K/uL 0.4  0.5  0.5  0.4 R 0.5 R 0.5 R 0.5 R  Eosinophils Relative % 1  4  6  5  3  4  5    Eosinophils Absolute 0.0 - 0.5 K/uL 0.0  0.2  0.2  0.2 R 0.1 R 0.1 R 0.2 R  Basophils Relative % 0  1  1  1  1  1  1    Basophils Absolute 0.0 - 0.1 K/uL 0.0  0.1  0.0  0.0 R, CM 0.0 R, CM 0.0 R, CM 0.0 R, CM  Immature Granulocytes % 0  0  0       Abs Immature Granulocytes 0.00 - 0.07 K/uL 0.01  0.00 CM 0.00 CM      Comment: Performed at [facility], [address] [place], [place] [address]  Resulting Agency  [facility] CLIN LAB [place] CLIN LAB [place] CLIN LAB [place] CLIN LAB [place] CLIN LAB [place] CLIN LAB [place] CLIN LAB      Specimen Collected: 09/27/18 09:45  Last Resulted: 09/27/18 10:00

## 2018-09-27 NOTE — Progress Notes (Signed)
Patient here for follow up. No concerns voiced.  °

## 2018-09-28 ENCOUNTER — Encounter: Payer: Self-pay | Admitting: Oncology

## 2018-09-28 ENCOUNTER — Encounter: Payer: Self-pay | Admitting: Hematology and Oncology

## 2018-09-28 NOTE — Progress Notes (Signed)
[facility] day:  09/28/2018    Chief Complaint: Charles Flores is an 85 y.o. male with prostate cancer and B12 deficiency who is seen for  3 month assessment.  Pertinent oncology history Patient previously follows up with Dr. Mike Gip.  He switched care to me on 09/05/2018. The patient is a 85 year old African American gentleman with prostate cancer and a rising PSA on Lupron.  Prostate cancer was discovered approximately 20 years secondary to an elevated PSA.  He underwent biopsy at [facility] (no report available). Initial PSA was 774.   I have personally reviewed the radiological images as listed and agreed with the findings in the report. Abdomen and pelvic CT on 01/05/2006 revealed an enlarged and heterogeneous prostate with mass effect on the base of the bladder.  There was no adenopathy.  Chest, abdomen, and pelvic CT on 03/15/2016 revealed moderate centrilobular emphysema.  There were mild lucent mottled appearance of the osseous structures (cannot exclude multiple myeloma).  There were scattered pulmonary nodules (4 mm or less in size).  There was a 2.7 cm low-attenuation left thyroid nodule.    Abdomen and pelvic CT on 12/07/2016 revealed no new or progressive findings.  There was no evidence of lymphadenopathy in the abdomen or pelvis. There was a stable appearance of heterogeneous/mottled bony mineralization.  Abdomen and pelvic CT on 11/27/2017 was stable with no evidence of adenopathy within the abdomen and pelvis.  There were no suspicious bone lesions.  There was a stable 3 mm RLL nodule.  Work-up on 03/17/2016 revealed no monoclonal protein.  SPEP revealed no monoclonal protein.  Kappa free light chains were 28.9, lambda free light chains 20.7 with a ratio of 1.40 (normal).  Immunoglobulins were normal (IgG 1391, IgA 282, IgM 29).  24 hour urine revealed no monoclonal protein. Thyroid ultrasound on 03/22/2016 revealed a  solitary 2.9 cm left lower pole nodule.  Thyroid biopsy on 04/14/2016 was benign (Bethesda category II) with abundant foamy macrophages and colloid. The cytologic findings were compatible with a cystic colloid nodule  Bone scan on 04/08/2014, 12/29/2014, 02/23/2016, 12/07/2016, and 11/27/2017 revealed no evidence of metastatic disease.  Patient has been on Lupron and Casodex.  He receives Lupron 30 mg every 4 months (last 12/06/2017).    PSA has been followed: 127.6 on 06/30/2014, 168.3 on 12/02/2014, 63.32 on 02/01/2015, 40.31 on 03/03/2015, 29.12 on 04/08/2015, 11.28 on 07/14/2015, 11.02 on 08/17/2015, 10.35 on 11/15/2015, 14.37 on 02/15/2016, 12.46 on 06/06/2016, 13.5 on 06/23/2016, 16.63 on 09/05/2016, 20.77 on 11/07/2016, 16.41 on 12/11/2016, 26.69 on 02/27/2017, 25.67 on 04/02/2017, 19.16 on 05/10/2017, 18.94 on 07/02/2017, 28.28 on 10/01/2017, 30.23 on 12/06/2017, and 34.56 on 03/14/2018.  He has a normocytic anemia. He was diagnosed with B12 deficiency on 12/24/2014. B12 was 169 (low).  He began B12 supplimentation on 02/01/2015 (last 11/15/2017).  Parietal cell antibody was positive on 07/02/2017.  Folate was 11 on 12/24/2014 and 12 on 06/06/2016.  Normal studies included a CMP, ferritin, iron studies, folate, and TSH. TSH and free T4 were normal on 04/02/2017.   # Limited abdominal ultrasound was done on 03/14/2018 revealed a 21.8 x 8.3 x 10.2 cm fatty mass with internal calcifications.  Findings felt to correspond to the CT findings on 11/2017, and not significantly changed from 12/2015 scan.  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  Neoplasm felt to be unlikely.  INTERVAL HISTORY Charles Flores is a 85 y.o.  male who has above history reviewed by me today presents for follow up visit for management of prostate cancer. Switch to Zytiga 750 mg daily as well as prednisone 5 mg twice daily. Reports tolerating fine.  Blood pressure has been stable. Today he  has no complaints  Reports feeling at baseline. Weight has been stable since last visit.  Gained 4 pounds Patient continues on monthly parenteral B12 supplementation.  Last injection was received on 08/08/2018 He is also on Lupron injection.  Lupron 30 mg was injected on 06/13/2018.    Past Medical History:  Diagnosis Date  . Anemia 12/24/2014  . Arthritis   . B12 deficiency anemia 01/01/2015  . Cataract   . Coronary artery disease   . GERD (gastroesophageal reflux disease)   . Hyperlipidemia   . Hyperlipidemia   . Prostate cancer ([place])   . Renal insufficiency 04/15/2017  . Thyroid nodule 03/14/2016    Past Surgical History:  Procedure Laterality Date  . PROSTATE BIOPSY      Family History  Problem Relation Age of Onset  . Diabetes Sister   . Stroke Sister   . Gastric cancer Sister   . Cancer Mother   . Breast cancer Mother   . Kidney disease Father    Social History   Socioeconomic History  . Marital status: Single    Spouse name: Not on file  . Number of children: Not on file  . Years of education: Not on file  . Highest education level: Not on file  Occupational History  . Not on file  Social Needs  . Financial resource strain: Not on file  . Food insecurity:    Worry: Not on file    Inability: Not on file  . Transportation needs:    Medical: Not on file    Non-medical: Not on file  Tobacco Use  . Smoking status: Former Smoker    Packs/day: 0.50    Years: 15.00    Pack years: 7.50    Types: Cigarettes  . Smokeless tobacco: Never Used  . Tobacco comment: quit 40 years  Substance and Sexual Activity  . Alcohol use: No    Alcohol/week: 0.0 standard drinks  . Drug use: No  . Sexual activity: Yes    Partners: Male  Lifestyle  . Physical activity:    Days per week: Not on file    Minutes per session: Not on file  . Stress: Not on file  Relationships  . Social connections:    Talks on phone: Not on file    Gets together: Not on file    Attends  religious service: Not on file    Active member of club or organization: Not on file    Attends meetings of clubs or organizations: Not on file    Relationship status: Not on file  . Intimate partner violence:    Fear of current or ex partner: Not on file    Emotionally abused: Not on file    Physically abused: Not on file    Forced sexual activity: Not on file  Other Topics Concern  . Not on file  Social History Narrative  . Not on file     Allergies: No Known Allergies  Current Outpatient Medications  Medication Sig Dispense Refill  . abiraterone acetate (ZYTIGA) 250 MG tablet Take 3 tablets (750 mg total) by mouth daily. Take on an empty stomach 1 hour before or 2 hours after a meal 90 tablet 0  . leuprolide (  LUPRON) 7.5 MG injection Inject 7.5 mg into the muscle once. Per dr Elnoria Howard urology    . midodrine (PROAMATINE) 2.5 MG tablet Take 1 tablet (2.5 mg total) by mouth 3 (three) times daily with meals. 270 tablet 2  . predniSONE (DELTASONE) 5 MG tablet Take 1 tablet (5 mg total) by mouth 2 (two) times daily with a meal. 60 tablet 3  . omeprazole (PRILOSEC) 20 MG capsule Take 1 capsule (20 mg total) by mouth daily. 30 capsule 2   No current facility-administered medications for this visit.    Review of Systems  Constitutional: Negative for appetite change, chills, fatigue, fever and unexpected weight change.  HENT:   Negative for hearing loss and voice change.   Eyes: Negative for eye problems and icterus.  Respiratory: Negative for chest tightness, cough and shortness of breath.   Cardiovascular: Positive for leg swelling. Negative for chest pain.  Gastrointestinal: Negative for abdominal distention and abdominal pain.  Endocrine: Negative for hot flashes.  Genitourinary: Negative for difficulty urinating, dysuria and frequency.   Musculoskeletal: Negative for arthralgias.  Skin: Negative for itching and rash.  Neurological: Negative for light-headedness and numbness.   Hematological: Negative for adenopathy. Does not bruise/bleed easily.  Psychiatric/Behavioral: Negative for confusion.    Performance status (ECOG): 1 - Symptomatic but completely ambulatory  Vital Signs BP 120/75 (BP Location: Left Arm)   Pulse 81   Temp (!) 96.4 F (35.8 C) (Tympanic)   Resp 18   Wt 148 lb 4.8 oz (67.3 kg)   BMI 20.68 kg/m   Physical Exam  Constitutional: He is oriented to person, place, and time. He appears healthy. No distress.  Thin gentleman  HENT:  Head: Normocephalic and atraumatic.  Nose: Nose normal.  Mouth/Throat: Oropharynx is clear and moist and mucous membranes are normal. He has dentures. No oropharyngeal exudate.  Eyes: Pupils are equal, round, and reactive to light. EOM are normal. No scleral icterus.  Brown eyes.  Slight ptosis.  Neck: Normal range of motion. Neck supple. No tracheal deviation present. No thyromegaly present.  Cardiovascular: Normal rate, regular rhythm, normal heart sounds and intact distal pulses. Exam reveals no gallop and no friction rub.  No murmur heard. Pulmonary/Chest: Effort normal and breath sounds normal. No respiratory distress. He has no wheezes. He has no rales. He exhibits no tenderness.  Abdominal: Soft. Bowel sounds are normal. He exhibits no distension. Mass: Large right flank mass: Previously ultrasound consistent with a lipomatous growth fat necrosis and resultant calcification. There is no abdominal tenderness.  Musculoskeletal: Normal range of motion.        General: No tenderness or edema.  Lymphadenopathy:    He has no cervical adenopathy.    He has no axillary adenopathy.       Right: No supraclavicular adenopathy present.       Left: No supraclavicular adenopathy present.  Neurological: He is alert and oriented to person, place, and time. No cranial nerve deficit. He exhibits normal muscle tone. Coordination normal.  Skin: Skin is warm and dry. No rash noted. He is not diaphoretic. No erythema.   Psychiatric: Mood, affect and judgment normal.  Nursing note and vitals reviewed.  CBC    Component Value Date/Time   WBC 5.0 09/27/2018 0945   RBC 3.30 (L) 09/27/2018 0945   HGB 10.9 (L) 09/27/2018 0945   HCT 32.9 (L) 09/27/2018 0945   PLT 134 (L) 09/27/2018 0945   MCV 99.7 09/27/2018 0945   MCH 33.0 09/27/2018 0945  MCHC 33.1 09/27/2018 0945   RDW 13.2 09/27/2018 0945   LYMPHSABS 0.8 09/27/2018 0945   MONOABS 0.4 09/27/2018 0945   EOSABS 0.0 09/27/2018 0945   BASOSABS 0.0 09/27/2018 0945   CMP Latest Ref Rng & Units 09/27/2018 09/05/2018 06/13/2018  Glucose 70 - 99 mg/dL 102(H) 93 94  BUN 8 - 23 mg/dL 26(H) 25(H) 22  Creatinine 0.61 - 1.24 mg/dL 1.49(H) 1.55(H) 1.42(H)  Sodium 135 - 145 mmol/L 139 141 138  Potassium 3.5 - 5.1 mmol/L 3.9 4.3 4.2  Chloride 98 - 111 mmol/L 107 107 107  CO2 22 - 32 mmol/L '28 26 26  ' Calcium 8.9 - 10.3 mg/dL 9.1 9.3 9.4  Total Protein 6.5 - 8.1 g/dL 7.1 7.2 -  Total Bilirubin 0.3 - 1.2 mg/dL 0.9 1.0 -  Alkaline Phos 38 - 126 U/L 65 55 -  AST 15 - 41 U/L 17 16 -  ALT 0 - 44 U/L 13 10 -    Assessment and Plan 85 y.o. male present for management of prostate cancer and vitamin B12 deficiency. 1. Prostate cancer ([place])   2. B12 deficiency   3. Encounter for monitoring Lupron therapy   4. Encounter for antineoplastic chemotherapy   5. 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 Started on Zytiga and prednisone 5 mg twice daily 4 weeks ago. Responding to treatment, PSA has decreased to 22.78.    #Androgen deprivation therapy, last Lupron 30 mg was given on 06/13/2018.  He is due for Lupron injection around 10/14/2018.  We will arrange. Start omeprazole 20 mg for GI prophylaxis.  Rinse sent to pharmacy.  #Vitamin B12 deficiency with positive antiparietal antibody.  Vitamin B12 level repeated on 09/27/2018 showed 361. We will arrange patient's and monthly B12  injection.  #Anemia of CKD, hemoglobin 10.9.  Continue to monitor. #Thrombocytopenia, 1 34,000.  Continue to monitor. #Androgen deprivation therapy, last Lupron 30 mg was given on 06/13/2018.  Due on 10/14/2018.  #Protein calorie malnutrition, he follows up with dietitian.  Has gained weight since last visit.  Continue to monitor.  Orders Placed This Encounter  Procedures  . CBC with Differential/Platelet    Standing Status:   Future    Standing Expiration Date:   09/29/2019  . Comprehensive metabolic panel    Standing Status:   Future    Standing Expiration Date:   09/29/2019  . PSA    Standing Status:   Future    Standing Expiration Date:   09/29/2019  . Retic Panel    Standing Status:   Future    Standing Expiration Date:   09/29/2019   We spent sufficient time to discuss many aspect of care, questions were answered to patient's satisfaction. Total face to face encounter time for this patient visit was 25 min. >50% of the time was  spent in counseling and coordination of care.   Earlie Server, MD, PhD Hematology Oncology [facility] Pager- [phone] 09/28/2018

## 2018-09-30 ENCOUNTER — Other Ambulatory Visit: Payer: Self-pay | Admitting: Oncology

## 2018-09-30 DIAGNOSIS — C61 Malignant neoplasm of prostate: Secondary | ICD-10-CM

## 2018-09-30 NOTE — Telephone Encounter (Signed)
)   Ref Range & Units 3d ago 3wk ago 59mo ago 44mo ago 47mo ago 55mo ago 48mo ago  WBC 4.0 - 10.5 K/uL 5.0  4.1  3.5Low   3.3Low  R 4.1 R 3.8 R 3.5Low  R  RBC 4.22 - 5.81 MIL/uL 3.30Low   3.33Low   3.38Low   3.54Low  R 3.52Low  R 3.65Low  R 3.61Low  R  Hemoglobin 13.0 - 17.0 g/dL 10.9Low   11.0Low   11.1Low   11.9Low  R 11.9Low  R 12.3Low  R 11.9Low  R  HCT 39.0 - 52.0 % 32.9Low   32.9Low   33.1Low   35.1Low  R 34.2Low  R 35.7Low  R 35.3Low  R  MCV 80.0 - 100.0 fL 99.7  98.8  97.9  99.0  97.2  97.9  97.8   MCH 26.0 - 34.0 pg 33.0  33.0  32.8  33.7  33.8  33.8  32.9   MCHC 30.0 - 36.0 g/dL 33.1  33.4  33.5  34.1 R 34.8 R 34.5 R 33.6 R  RDW 11.5 - 15.5 % 13.2  13.1  12.5  13.6 R 13.7 R 13.9 R 14.0 R  Platelets 150 - 400 K/uL 134Low   176  171  186 R 194 R 183 R 186 R  nRBC 0.0 - 0.2 % 0.0  0.0  0.0       Neutrophils Relative % % 75  52  52  52  62  57  50   Neutro Abs 1.7 - 7.7 K/uL 3.8  2.1  1.8  1.7 R 2.5 R 2.2 R 1.7 R  Lymphocytes Relative % 16  30  27  30  23  26  31    Lymphs Abs 0.7 - 4.0 K/uL 0.8  1.3  0.9  1.0 R 0.9Low  R 1.0 R 1.1 R  Monocytes Relative % 8  13  14  12  11  12  13    Monocytes Absolute 0.1 - 1.0 K/uL 0.4  0.5  0.5  0.4 R 0.5 R 0.5 R 0.5 R  Eosinophils Relative % 1  4  6  5  3  4  5    Eosinophils Absolute 0.0 - 0.5 K/uL 0.0  0.2  0.2  0.2 R 0.1 R 0.1 R 0.2 R  Basophils Relative % 0  1  1  1  1  1  1    Basophils Absolute 0.0 - 0.1 K/uL 0.0  0.1  0.0  0.0 R, CM 0.0 R, CM 0.0 R, CM 0.0 R, CM  Immature Granulocytes % 0  0  0       Abs Immature Granulocytes 0.00 - 0.07 K/uL 0.01  0.00 CM 0.00 CM      Comment: Performed at [facility], [address] [place], [place] [address]  Resulting Agency  [facility] CLIN LAB [place] CLIN LAB [place] CLIN LAB [place] CLIN LAB [place] CLIN LAB [place] CLIN LAB [place] [facility] LAB      Specimen Collected: 09/27/18 09:45  Last Resulted: 09/27/18 10:00     Lab Flowsheet    Order Details    View Encounter    Lab and Collection Details    Routing    Result  History      CM=Additional commentsR=Reference range differs from displayed range      Other Results from 09/27/2018   Contains abnormal data PSA  Order: [id number]   Status:  Final result  Visible to  patient:  No (Not Released)  Next appt:  10/17/2018 at 02:00 PM in Oncology ([facility] INJECTION)  Dx:  Prostate cancer ([place])   Ref Range & Units 3d ago 3wk ago 53mo ago 50mo ago 31mo ago 51mo ago 74mo ago  Prostatic Specific Antigen 0.00 - 4.00 ng/mL 22.78High   73.04High  CM 31.24High  CM 34.56High  CM 30.23High  CM 28.18High  CM 28.28High  CM  Comment: (NOTE)  While PSA levels of <=4.0 ng/ml are reported as reference range, some  men with levels below 4.0 ng/ml can have prostate cancer and many men  with PSA above 4.0 ng/ml do not have prostate cancer. Other tests  such as free PSA, age specific reference ranges, PSA velocity and PSA  doubling time may be helpful especially in men less than 83 years  old.  Performed at [facility], [place] [address] [place], [place]  [address]   Resulting Agency  [facility] [place] CL[facility] LAB [place] CLIN LAB [place] [facility] [place] CLIN LAB [place] CL[facility] [place] [facility]      Specimen Collected: 09/27/18 09:45  Last Resulted: 09/27/18 17:01     Lab Flowsheet    Order Details    View Encounter    Lab and Collection Details    Routing    Result History      CM=Additional comments        Contains abnormal data Comprehensive metabolic panel  Order: [id number]   Status:  Final result  Visible to patient:  No (Not Released)  Next appt:  10/17/2018 at 02:00 PM in Oncology ([facility] INJECTION)  Dx:  Prostate cancer ([place])   Ref Range & Units 3d ago 3wk ago 72mo ago 56mo ago 30mo ago 30mo ago 73mo ago  Sodium 135 - 145 mmol/L 139  141  138  138  136  138  134Low    Potassium 3.5 - 5.1 mmol/L 3.9  4.3  4.2  4.5  4.2  4.4  4.2   Chloride 98 - 111 mmol/L 107  107  107  106  107 R 104 R 106 R  CO2 22 - 32 mmol/L 28  26  26  22  22  26  25    Glucose, Bld 70 - 99  mg/dL 102High   93  94  105High   95 R 98 R 103High  R  BUN 8 - 23 mg/dL 26High   25High   22  26High   22High  R 16 R 19 R  Creatinine, Ser 0.61 - 1.24 mg/dL 1.49High   1.55High   1.42High   1.51High   1.39High   1.15  1.27High    Calcium 8.9 - 10.3 mg/dL 9.1  9.3  9.4  9.5  9.6  9.2  9.0   Total Protein 6.5 - 8.1 g/dL 7.1  7.2      7.2   Albumin 3.5 - 5.0 g/dL 4.0  4.1      4.1   AST 15 - 41 U/L 17  16      15    ALT 0 - 44 U/L 13  10      8Low  R  Alkaline Phosphatase 38 - 126 U/L 65  55      66   Total Bilirubin 0.3 - 1.2 mg/dL 0.9  1.0      0.7   GFR calc non Af Amer >60 mL/min 41Low   39Low   42Low   39Low  44Low   55Low   49Low    GFR calc Af Amer >60 mL/min 48Low   45Low   49Low  CM 45Low  CM 51Low  CM >60 CM 56Low  CM  Anion gap 5 - 15 4Low   8 CM 5 CM 10 CM 7 CM 8 CM 3Low  CM  Comment: Performed at [facility], [address] [place], [place] [address]  Resulting Agency  [facility] CLIN LAB [place] CLIN LAB [place] CLIN LAB [place] CLIN LAB [place] CLIN LAB [place] CLIN LAB [place] [facility] LAB      Specimen Collected: 09/27/18 09:45  Last Resulted: 09/27/18 10:15

## 2018-10-07 MED FILL — predniSONE 5 MG TABS: 5 | 30 days supply | Qty: 60 | Fill #0

## 2018-10-07 MED FILL — ABIRATERONE ACETATE 250 MG: 250 | 30 days supply | Qty: 90 | Fill #0

## 2018-10-17 ENCOUNTER — Inpatient Hospital Stay: Payer: Medicare Other

## 2018-10-17 NOTE — Progress Notes (Signed)
Nutrition  Patient did not show up for nutrition follow-up appointment today.  Will send a message to scheduling to offer another appointment.  Will also send message to provider.  Gianluca Chhim B. Zenia Resides, [place], [place] Registered Dietitian [phone] (pager)

## 2018-10-21 ENCOUNTER — Inpatient Hospital Stay: Payer: Medicare Other

## 2018-10-21 ENCOUNTER — Inpatient Hospital Stay: Payer: Medicare Other | Attending: Oncology

## 2018-10-21 DIAGNOSIS — D519 Vitamin B12 deficiency anemia, unspecified: Secondary | ICD-10-CM

## 2018-10-21 DIAGNOSIS — Z5111 Encounter for antineoplastic chemotherapy: Secondary | ICD-10-CM | POA: Diagnosis not present

## 2018-10-21 DIAGNOSIS — E538 Deficiency of other specified B group vitamins: Secondary | ICD-10-CM | POA: Diagnosis not present

## 2018-10-21 DIAGNOSIS — C61 Malignant neoplasm of prostate: Secondary | ICD-10-CM | POA: Diagnosis not present

## 2018-10-21 MED ORDER — CYANOCOBALAMIN 1000 MCG/ML IJ SOLN
1000.0000 ug | Freq: Once | INTRAMUSCULAR | Status: AC
  Administered 2018-10-21: 1000 ug via INTRAMUSCULAR

## 2018-10-21 MED ORDER — LEUPROLIDE ACETATE (4 MONTH) 30 MG IM KIT
30.0000 mg | PACK | Freq: Once | INTRAMUSCULAR | Status: AC
  Administered 2018-10-21: 30 mg via INTRAMUSCULAR

## 2018-10-21 NOTE — Progress Notes (Signed)
Nutrition Follow-up:  Patient with prostate cancer and vit B 12 deficiency followed by Dr Tasia Catchings.  Patient has started on zytiga and predisone and lupron.    Met with patient following Vit B 12 injection.  Patient reports that his appetite is good.  Breakfast is egg, with bacon or spam and cheese.  Lunch is vegetables (green beans, corn, sweet peas). Supper is same as lunch.  May add meat. 'I can't eat as much as I use to." Reports that he is drinking 3 ensure enlive daily.    Patient denies issues with chewing, swallowing foods.  Denies issues with constipation, nausea, diarrhea.      Medications: zytiga, predinsone added  Labs: BUN 26, creatinine 1.49  Anthropometrics:   Weight measured today at 141 lb on clinic scales without jacket, decreased from 148 lb 4.8 oz on 2/7.    Weight 144 lb on 1/16 141 lb 8 oz on 12/19 147 lb on 9/16 146 lb on 8/13 143 lb on 7/25  NUTRITION DIAGNOSIS:  Inadequate oral intake declining   MALNUTRITION DIAGNOSIS: severe malnutrition continues   INTERVENTION:  Appetite may increase with predinsone, if not patient may benefit from trial of appetite stimulant. Discussed foods to add to diet to increase calories and protein (chicken salad sandwiches, egg salad sandwiches).  Patient will plan to add these at lunch and supper Continue ensure enlive at least 3 per day. Additional case of ensure given today with coupons Offered follow-up appointment and patient declined at this time.  Wants to contact RD if needed in the future. Contact information given to patient    MONITORING, EVALUATION, GOAL: weight trends, intake   NEXT VISIT: patient refused follow-up with RD at this time.  Patient to contact RD as needed  Andras Grunewald B. Zenia Resides, [place], [place] Registered Dietitian [phone] (pager)

## 2018-10-29 ENCOUNTER — Other Ambulatory Visit: Payer: Self-pay | Admitting: Oncology

## 2018-10-29 DIAGNOSIS — C61 Malignant neoplasm of prostate: Secondary | ICD-10-CM

## 2018-10-29 NOTE — Telephone Encounter (Signed)
)   Ref Range & Units 51mo ago  WBC 4.0 - 10.5 K/uL 5.0   RBC 4.22 - 5.81 MIL/uL 3.30Low    Hemoglobin 13.0 - 17.0 g/dL 10.9Low    HCT 39.0 - 52.0 % 32.9Low    MCV 80.0 - 100.0 fL 99.7   MCH 26.0 - 34.0 pg 33.0   MCHC 30.0 - 36.0 g/dL 33.1   RDW 11.5 - 15.5 % 13.2   Platelets 150 - 400 K/uL 134Low    nRBC 0.0 - 0.2 % 0.0   Neutrophils Relative % % 75   Neutro Abs 1.7 - 7.7 K/uL 3.8   Lymphocytes Relative % 16   Lymphs Abs 0.7 - 4.0 K/uL 0.8   Monocytes Relative % 8   Monocytes Absolute 0.1 - 1.0 K/uL 0.4   Eosinophils Relative % 1   Eosinophils Absolute 0.0 - 0.5 K/uL 0.0   Basophils Relative % 0   Basophils Absolute 0.0 - 0.1 K/uL 0.0   Immature Granulocytes % 0   Abs Immature Granulocytes 0.00 - 0.07 K/uL 0.01   Comment: Performed at [facility], [address] [place], [place] [address]  Resulting Agency  [facility] CLIN LAB      Specimen Collected: 09/27/18 09:45  Last Resulted: 09/27/18 10:00     Lab Flowsheet    Order Details    View Encounter    Lab and Collection Details    Routing    Result History          Other Results from 09/27/2018   Contains abnormal data PSA  Order: [id number]   Status:  Final result  Visible to patient:  No (Not Released)  Next appt:  11/08/2018 at 09:30 AM in Pulmonology Kendell Bane, NP)  Dx:  Prostate cancer [facility])   Ref Range & Units 55mo ago  Prostatic Specific Antigen 0.00 - 4.00 ng/mL 22.78High    Comment: (NOTE)  While PSA levels of <=4.0 ng/ml are reported as reference range, some  men with levels below 4.0 ng/ml can have prostate cancer and many men  with PSA above 4.0 ng/ml do not have prostate cancer. Other tests  such as free PSA, age specific reference ranges, PSA velocity and PSA  doubling time may be helpful especially in men less than 85 years  old.  Performed at [facility], [place] [address], [place], [place]  [address]   Resulting Agency  [facility] CLIN LAB      Specimen Collected: 09/27/18 09:45   Last Resulted: 09/27/18 17:01     Lab Flowsheet    Order Details    View Encounter    Lab and Collection Details    Routing    Result History            Contains abnormal data Comprehensive metabolic panel  Order: [id number]   Status:  Final result  Visible to patient:  No (Not Released)  Next appt:  11/08/2018 at 09:30 AM in Pulmonology Kendell Bane, NP)  Dx:  Prostate cancer [facility])   Ref Range & Units 56mo ago  Sodium 135 - 145 mmol/L 139   Potassium 3.5 - 5.1 mmol/L 3.9   Chloride 98 - 111 mmol/L 107   CO2 22 - 32 mmol/L 28   Glucose, Bld 70 - 99 mg/dL 102High    BUN 8 - 23 mg/dL 26High    Creatinine, Ser 0.61 - 1.24 mg/dL 1.49High    Calcium 8.9 - 10.3 mg/dL 9.1  Total Protein 6.5 - 8.1 g/dL 7.1   Albumin 3.5 - 5.0 g/dL 4.0   AST 15 - 41 U/L 17   ALT 0 - 44 U/L 13   Alkaline Phosphatase 38 - 126 U/L 65   Total Bilirubin 0.3 - 1.2 mg/dL 0.9   GFR calc non Af Amer >60 mL/min 41Low    GFR calc Af Amer >60 mL/min 48Low    Anion gap 5 - 15 4Low    Comment: Performed at [facility], [address] [place], [place] [address]  Resulting Agency  [facility] CLIN LAB      Specimen Collected: 09/27/18 09:45  Last Resulted: 09/27/18 10:15     Lab Flowsheet    Order Details    View Encounter    Lab and Collection Details    Routing    Result History            Vitamin B12  Order: [id number]   Status:  Final result  Visible to patient:  No (Not Released)  Next appt:  11/08/2018 at 09:30 AM in Pulmonology Kendell Bane, NP)  Dx:  Prostate cancer [facility])   Ref Range & Units 8mo ago  Vitamin B-12 180 - 914 pg/mL 361   Comment: (NOTE)  This assay is not validated for testing neonatal or  myeloproliferative syndrome specimens for Vitamin B12 levels.  Performed at [facility], [place] [address], [place], [place]  [address]   Resulting Agency  [facility] CLIN LAB      Specimen Collected: 09/27/18 09:45  Last Resulted: 09/27/18 17:01

## 2018-11-01 MED FILL — ABIRATERONE ACETATE 250 MG: 250 | 30 days supply | Qty: 90 | Fill #0

## 2018-11-01 MED FILL — predniSONE 5 MG TABS: 5 | 30 days supply | Qty: 60 | Fill #1

## 2018-11-08 ENCOUNTER — Encounter: Payer: Self-pay | Admitting: Adult Health

## 2018-11-08 ENCOUNTER — Ambulatory Visit (INDEPENDENT_AMBULATORY_CARE_PROVIDER_SITE_OTHER): Payer: Medicare Other | Admitting: Adult Health

## 2018-11-08 VITALS — BP 110/70 | HR 75 | Resp 16 | Ht 71.0 in | Wt 141.0 lb

## 2018-11-08 DIAGNOSIS — Z0001 Encounter for general adult medical examination with abnormal findings: Secondary | ICD-10-CM

## 2018-11-08 DIAGNOSIS — R300 Dysuria: Secondary | ICD-10-CM

## 2018-11-08 DIAGNOSIS — C61 Malignant neoplasm of prostate: Secondary | ICD-10-CM | POA: Diagnosis not present

## 2018-11-08 DIAGNOSIS — D519 Vitamin B12 deficiency anemia, unspecified: Secondary | ICD-10-CM

## 2018-11-08 DIAGNOSIS — I950 Idiopathic hypotension: Secondary | ICD-10-CM

## 2018-11-08 NOTE — Patient Instructions (Signed)

## 2018-11-08 NOTE — Progress Notes (Signed)
[facility] [place], [place] [address]  Internal MEDICINE  Office Visit Note  Patient Name: Charles Flores  [id number]  [id number]  Date of Service: 11/19/2018  Chief Complaint  Patient presents with  . Annual Exam  . Hyperlipidemia     HPI Pt is here for routine health maintenance examination.  He is a frail appearing 85 yo AA male.  He denies any current issues.  Current Medication: Outpatient Encounter Medications as of 11/08/2018  Medication Sig  . abiraterone acetate (ZYTIGA) 250 MG tablet TAKE 3 TABLETS (750 MG TOTAL) BY MOUTH DAILY. TAKE ON AN EMPTY STOMACH 1 HOUR BEFORE OR 2 HOURS AFTER A MEAL  . midodrine (PROAMATINE) 2.5 MG tablet Take 1 tablet (2.5 mg total) by mouth 3 (three) times daily with meals.  Marland Kitchen omeprazole (PRILOSEC) 20 MG capsule Take 1 capsule (20 mg total) by mouth daily.  . predniSONE (DELTASONE) 5 MG tablet Take 1 tablet (5 mg total) by mouth 2 (two) times daily with a meal.   No facility-administered encounter medications on file as of 11/08/2018.     Surgical History: Past Surgical History:  Procedure Laterality Date  . PROSTATE BIOPSY      Medical History: Past Medical History:  Diagnosis Date  . Anemia 12/24/2014  . Arthritis   . B12 deficiency anemia 01/01/2015  . Cataract   . Coronary artery disease   . GERD (gastroesophageal reflux disease)   . Hyperlipidemia   . Hyperlipidemia   . Prostate cancer ([place])   . Renal insufficiency 04/15/2017  . Thyroid nodule 03/14/2016    Family History: Family History  Problem Relation Age of Onset  . Diabetes Sister   . Stroke Sister   . Gastric cancer Sister   . Cancer Mother   . Breast cancer Mother   . Kidney disease Father       Review of Systems  Constitutional: Negative.  Negative for chills, fatigue and unexpected weight change.  HENT: Negative.  Negative for congestion, rhinorrhea, sneezing and sore throat.   Eyes: Negative for redness.  Respiratory: Negative.   Negative for cough, chest tightness and shortness of breath.   Cardiovascular: Negative.  Negative for chest pain and palpitations.  Gastrointestinal: Negative.  Negative for abdominal pain, constipation, diarrhea, nausea and vomiting.  Endocrine: Negative.   Genitourinary: Negative.  Negative for dysuria and frequency.  Musculoskeletal: Negative.  Negative for arthralgias, back pain, joint swelling and neck pain.  Skin: Negative.  Negative for rash.  Allergic/Immunologic: Negative.   Neurological: Negative.  Negative for tremors and numbness.  Hematological: Negative for adenopathy. Does not bruise/bleed easily.  Psychiatric/Behavioral: Negative.  Negative for behavioral problems, sleep disturbance and suicidal ideas. The patient is not nervous/anxious.      Vital Signs: BP 110/70   Pulse 75   Resp 16   Ht 5\' 11"  (1.803 m)   Wt 141 lb (64 kg)   SpO2 96%   BMI 19.67 kg/m    Physical Exam Vitals signs and nursing note reviewed.  Constitutional:      General: He is not in acute distress.    Appearance: He is well-developed. He is not diaphoretic.  HENT:     Head: Normocephalic and atraumatic.     Mouth/Throat:     Pharynx: No oropharyngeal exudate.  Eyes:     Pupils: Pupils are equal, round, and reactive to light.  Neck:     Musculoskeletal: Normal range of motion and neck supple.  Thyroid: No thyromegaly.     Vascular: No JVD.     Trachea: No tracheal deviation.  Cardiovascular:     Rate and Rhythm: Normal rate and regular rhythm.     Heart sounds: Normal heart sounds. No murmur. No friction rub. No gallop.   Pulmonary:     Effort: Pulmonary effort is normal. No respiratory distress.     Breath sounds: Normal breath sounds. No wheezing or rales.  Chest:     Chest wall: No tenderness.  Abdominal:     Palpations: Abdomen is soft.     Tenderness: There is no abdominal tenderness. There is no guarding.  Musculoskeletal: Normal range of motion.  Lymphadenopathy:      Cervical: No cervical adenopathy.  Skin:    General: Skin is warm and dry.  Neurological:     Mental Status: He is alert and oriented to person, place, and time.     Cranial Nerves: No cranial nerve deficit.  Psychiatric:        Behavior: Behavior normal.        Thought Content: Thought content normal.        Judgment: Judgment normal.      LABS: Recent Results (from the past 2160 hour(s))  PSA     Status: Abnormal   Collection Time: 09/05/18  9:37 AM  Result Value Ref Range   Prostatic Specific Antigen 73.04 (H) 0.00 - 4.00 ng/mL    Comment: (NOTE) While PSA levels of <=4.0 ng/ml are reported as reference range, some men with levels below 4.0 ng/ml can have prostate cancer and many men with PSA above 4.0 ng/ml do not have prostate cancer.  Other tests such as free PSA, age specific reference ranges, PSA velocity and PSA doubling time may be helpful especially in men less than 55 years old. Performed at [facility], [place] [address] [place], [place] [address]   Comprehensive metabolic panel     Status: Abnormal   Collection Time: 09/05/18  9:37 AM  Result Value Ref Range   Sodium 141 135 - 145 mmol/L   Potassium 4.3 3.5 - 5.1 mmol/L   Chloride 107 98 - 111 mmol/L   CO2 26 22 - 32 mmol/L   Glucose, Bld 93 70 - 99 mg/dL   BUN 25 (H) 8 - 23 mg/dL   Creatinine, Ser 1.55 (H) 0.61 - 1.24 mg/dL   Calcium 9.3 8.9 - 10.3 mg/dL   Total Protein 7.2 6.5 - 8.1 g/dL   Albumin 4.1 3.5 - 5.0 g/dL   AST 16 15 - 41 U/L   ALT 10 0 - 44 U/L   Alkaline Phosphatase 55 38 - 126 U/L   Total Bilirubin 1.0 0.3 - 1.2 mg/dL   GFR calc non Af Amer 39 (L) >60 mL/min   GFR calc Af Amer 45 (L) >60 mL/min   Anion gap 8 5 - 15    Comment: Performed at [facility], [address] [place], [place] [address]  CBC with Differential     Status: Abnormal   Collection Time: 09/05/18  9:37 AM  Result Value Ref Range   WBC 4.1 4.0 - 10.5 K/uL   RBC 3.33 (L) 4.22 - 5.81 MIL/uL    Hemoglobin 11.0 (L) 13.0 - 17.0 g/dL   HCT 32.9 (L) 39.0 - 52.0 %   MCV 98.8 80.0 - 100.0 fL   MCH 33.0 26.0 - 34.0 pg   MCHC 33.4 30.0 - 36.0 g/dL   RDW  13.1 11.5 - 15.5 %   Platelets 176 150 - 400 K/uL   nRBC 0.0 0.0 - 0.2 %   Neutrophils Relative % 52 %   Neutro Abs 2.1 1.7 - 7.7 K/uL   Lymphocytes Relative 30 %   Lymphs Abs 1.3 0.7 - 4.0 K/uL   Monocytes Relative 13 %   Monocytes Absolute 0.5 0.1 - 1.0 K/uL   Eosinophils Relative 4 %   Eosinophils Absolute 0.2 0.0 - 0.5 K/uL   Basophils Relative 1 %   Basophils Absolute 0.1 0.0 - 0.1 K/uL   Immature Granulocytes 0 %   Abs Immature Granulocytes 0.00 0.00 - 0.07 K/uL    Comment: Performed at [facility], [place], [place] [address]  Testosterone     Status: Abnormal   Collection Time: 09/05/18  9:37 AM  Result Value Ref Range   Testosterone <3 (L) 264 - 916 ng/dL    Comment: (NOTE) Adult male reference interval is based on a population of healthy nonobese males (BMI <30) between 47 and 40 years old. [place], [place] [phone]. PMID: [id number]. Performed At: [facility] [place], [place] [id number] Rush Farmer MD PZ:[id number]   PSA     Status: Abnormal   Collection Time: 09/27/18  9:45 AM  Result Value Ref Range   Prostatic Specific Antigen 22.78 (H) 0.00 - 4.00 ng/mL    Comment: (NOTE) While PSA levels of <=4.0 ng/ml are reported as reference range, some men with levels below 4.0 ng/ml can have prostate cancer and many men with PSA above 4.0 ng/ml do not have prostate cancer.  Other tests such as free PSA, age specific reference ranges, PSA velocity and PSA doubling time may be helpful especially in men less than 15 years old. Performed at [facility], [place] [address] [place], [place] [address]   Comprehensive metabolic panel     Status: Abnormal   Collection Time: 09/27/18  9:45 AM  Result Value Ref Range   Sodium 139 135 - 145 mmol/L    Potassium 3.9 3.5 - 5.1 mmol/L   Chloride 107 98 - 111 mmol/L   CO2 28 22 - 32 mmol/L   Glucose, Bld 102 (H) 70 - 99 mg/dL   BUN 26 (H) 8 - 23 mg/dL   Creatinine, Ser 1.49 (H) 0.61 - 1.24 mg/dL   Calcium 9.1 8.9 - 10.3 mg/dL   Total Protein 7.1 6.5 - 8.1 g/dL   Albumin 4.0 3.5 - 5.0 g/dL   AST 17 15 - 41 U/L   ALT 13 0 - 44 U/L   Alkaline Phosphatase 65 38 - 126 U/L   Total Bilirubin 0.9 0.3 - 1.2 mg/dL   GFR calc non Af Amer 41 (L) >60 mL/min   GFR calc Af Amer 48 (L) >60 mL/min   Anion gap 4 (L) 5 - 15    Comment: Performed at [facility], [address] [place], [place] [address]  CBC with Differential/Platelet     Status: Abnormal   Collection Time: 09/27/18  9:45 AM  Result Value Ref Range   WBC 5.0 4.0 - 10.5 K/uL   RBC 3.30 (L) 4.22 - 5.81 MIL/uL   Hemoglobin 10.9 (L) 13.0 - 17.0 g/dL   HCT 32.9 (L) 39.0 - 52.0 %   MCV 99.7 80.0 - 100.0 fL   MCH 33.0 26.0 - 34.0 pg   MCHC 33.1 30.0 - 36.0 g/dL   RDW 13.2 11.5 - 15.5 %  Platelets 134 (L) 150 - 400 K/uL   nRBC 0.0 0.0 - 0.2 %   Neutrophils Relative % 75 %   Neutro Abs 3.8 1.7 - 7.7 K/uL   Lymphocytes Relative 16 %   Lymphs Abs 0.8 0.7 - 4.0 K/uL   Monocytes Relative 8 %   Monocytes Absolute 0.4 0.1 - 1.0 K/uL   Eosinophils Relative 1 %   Eosinophils Absolute 0.0 0.0 - 0.5 K/uL   Basophils Relative 0 %   Basophils Absolute 0.0 0.0 - 0.1 K/uL   Immature Granulocytes 0 %   Abs Immature Granulocytes 0.01 0.00 - 0.07 K/uL    Comment: Performed at [facility], [address] [place], [place] [address]  Vitamin B12     Status: None   Collection Time: 09/27/18  9:45 AM  Result Value Ref Range   Vitamin B-12 361 180 - 914 pg/mL    Comment: (NOTE) This assay is not validated for testing neonatal or myeloproliferative syndrome specimens for Vitamin B12 levels. Performed at [facility], [place] [address] [place], [place] [address]   UA/M w/rflx Culture, Routine     Status: None   Collection Time:  11/08/18  9:32 AM  Result Value Ref Range   Specific Gravity, UA 1.020 1.005 - 1.030   pH, UA 6.0 5.0 - 7.5   Color, UA Yellow Yellow   Appearance Ur Clear Clear   Leukocytes, UA Negative Negative   Protein, UA Trace Negative/Trace   Glucose, UA Negative Negative   Ketones, UA Negative Negative   RBC, UA Negative Negative   Bilirubin, UA Negative Negative   Urobilinogen, Ur 1.0 0.2 - 1.0 mg/dL   Nitrite, UA Negative Negative   Microscopic Examination Comment     Comment: Microscopic follows if indicated.   Microscopic Examination See below:     Comment: Microscopic was indicated and was performed.   Urinalysis Reflex Comment     Comment: This specimen will not reflex to a Urine Culture.  Microscopic Examination     Status: Abnormal   Collection Time: 11/08/18  9:32 AM  Result Value Ref Range   WBC, UA None seen 0 - 5 /hpf   RBC, UA 3-10 (A) 0 - 2 /hpf   Epithelial Cells (non renal) None seen 0 - 10 /hpf   Casts None seen None seen /lpf   Mucus, UA Present Not Estab.   Bacteria, UA Few None seen/Few    Assessment/Plan: 1. Encounter for general adult medical examination with abnormal findings Up to date on PHM.   2. Idiopathic hypotension BP good today 110/70, continue taking  3. Anemia due to vitamin B12 deficiency, unspecified B12 deficiency type PT is being followed by Hematology, oncology for anemia during his treatment.  Stable at this time.   4. Prostate cancer ([place]) Continue treatment, follow up with Oncology as scheduled.   5. Dysuria - UA/M w/rflx Culture, Routine - Microscopic Examination  General Counseling: kia stavros understanding of the findings of todays visit and agrees with plan of treatment. I have discussed any further diagnostic evaluation that may be needed or ordered today. We also reviewed his medications today. he has been encouraged to call the office with any questions or concerns that should arise related to todays visit.   Orders  Placed This Encounter  Procedures  . Microscopic Examination  . UA/M w/rflx Culture, Routine    No orders of the defined types were placed in this encounter.   Time spent:  35 Minutes   This patient was seen by Orson Gear AGNP-C in Collaboration with Dr Lavera Guise as a part of collaborative care agreement    Kendell Bane AGNP-C Internal Medicine

## 2018-11-09 LAB — UA/M W/RFLX CULTURE, ROUTINE
Bilirubin, UA: NEGATIVE
Glucose, UA: NEGATIVE
Ketones, UA: NEGATIVE
Leukocytes, UA: NEGATIVE
Nitrite, UA: NEGATIVE
RBC, UA: NEGATIVE
Specific Gravity, UA: 1.02 (ref 1.005–1.030)
Urobilinogen, Ur: 1 mg/dL (ref 0.2–1.0)
pH, UA: 6 (ref 5.0–7.5)

## 2018-11-09 LAB — MICROSCOPIC EXAMINATION
Casts: NONE SEEN /lpf
EPITHELIAL CELLS (NON RENAL): NONE SEEN /HPF (ref 0–10)
WBC, UA: NONE SEEN /hpf (ref 0–5)

## 2018-11-19 ENCOUNTER — Encounter: Payer: Self-pay | Admitting: Adult Health

## 2018-11-22 ENCOUNTER — Other Ambulatory Visit: Payer: Self-pay | Admitting: Oncology

## 2018-11-22 DIAGNOSIS — E538 Deficiency of other specified B group vitamins: Secondary | ICD-10-CM

## 2018-11-23 ENCOUNTER — Other Ambulatory Visit: Payer: Self-pay | Admitting: Oncology

## 2018-11-23 DIAGNOSIS — C61 Malignant neoplasm of prostate: Secondary | ICD-10-CM

## 2018-11-26 ENCOUNTER — Inpatient Hospital Stay: Payer: Medicare Other | Attending: Oncology

## 2018-11-26 ENCOUNTER — Other Ambulatory Visit: Payer: Self-pay

## 2018-11-26 ENCOUNTER — Inpatient Hospital Stay: Payer: Medicare Other

## 2018-11-26 ENCOUNTER — Inpatient Hospital Stay: Payer: Medicare Other | Admitting: Oncology

## 2018-11-26 DIAGNOSIS — N183 Chronic kidney disease, stage 3 unspecified: Secondary | ICD-10-CM

## 2018-11-26 DIAGNOSIS — D519 Vitamin B12 deficiency anemia, unspecified: Secondary | ICD-10-CM

## 2018-11-26 DIAGNOSIS — Z79899 Other long term (current) drug therapy: Secondary | ICD-10-CM | POA: Insufficient documentation

## 2018-11-26 DIAGNOSIS — E538 Deficiency of other specified B group vitamins: Secondary | ICD-10-CM | POA: Diagnosis not present

## 2018-11-26 DIAGNOSIS — Z79818 Long term (current) use of other agents affecting estrogen receptors and estrogen levels: Secondary | ICD-10-CM

## 2018-11-26 DIAGNOSIS — C61 Malignant neoplasm of prostate: Secondary | ICD-10-CM | POA: Diagnosis present

## 2018-11-26 DIAGNOSIS — D631 Anemia in chronic kidney disease: Secondary | ICD-10-CM | POA: Insufficient documentation

## 2018-11-26 DIAGNOSIS — Z5111 Encounter for antineoplastic chemotherapy: Secondary | ICD-10-CM

## 2018-11-26 DIAGNOSIS — Z5181 Encounter for therapeutic drug level monitoring: Secondary | ICD-10-CM

## 2018-11-26 LAB — COMPREHENSIVE METABOLIC PANEL
ALT: 14 U/L (ref 0–44)
AST: 18 U/L (ref 15–41)
Albumin: 4 g/dL (ref 3.5–5.0)
Alkaline Phosphatase: 63 U/L (ref 38–126)
Anion gap: 8 (ref 5–15)
BUN: 26 mg/dL — ABNORMAL HIGH (ref 8–23)
CO2: 23 mmol/L (ref 22–32)
Calcium: 9.3 mg/dL (ref 8.9–10.3)
Chloride: 109 mmol/L (ref 98–111)
Creatinine, Ser: 1.58 mg/dL — ABNORMAL HIGH (ref 0.61–1.24)
GFR calc Af Amer: 44 mL/min — ABNORMAL LOW (ref >60)
GFR calc non Af Amer: 38 mL/min — ABNORMAL LOW (ref >60)
Glucose, Bld: 101 mg/dL — ABNORMAL HIGH (ref 70–99)
Potassium: 3.8 mmol/L (ref 3.5–5.1)
Sodium: 140 mmol/L (ref 135–145)
Total Bilirubin: 1.2 mg/dL (ref 0.3–1.2)
Total Protein: 6.9 g/dL (ref 6.5–8.1)

## 2018-11-26 LAB — CBC WITH DIFFERENTIAL/PLATELET
Abs Immature Granulocytes: 0.02 10*3/uL (ref 0.00–0.07)
Basophils Absolute: 0 10*3/uL (ref 0.0–0.1)
Basophils Relative: 1 %
Eosinophils Absolute: 0 10*3/uL (ref 0.0–0.5)
Eosinophils Relative: 1 %
HCT: 33.3 % — ABNORMAL LOW (ref 39.0–52.0)
Hemoglobin: 11.4 g/dL — ABNORMAL LOW (ref 13.0–17.0)
Immature Granulocytes: 0 %
Lymphocytes Relative: 14 %
Lymphs Abs: 0.8 10*3/uL (ref 0.7–4.0)
MCH: 34.3 pg — ABNORMAL HIGH (ref 26.0–34.0)
MCHC: 34.2 g/dL (ref 30.0–36.0)
MCV: 100.3 fL — ABNORMAL HIGH (ref 80.0–100.0)
Monocytes Absolute: 0.5 10*3/uL (ref 0.1–1.0)
Monocytes Relative: 8 %
Neutro Abs: 4.1 10*3/uL (ref 1.7–7.7)
Neutrophils Relative %: 76 %
Platelets: 142 10*3/uL — ABNORMAL LOW (ref 150–400)
RBC: 3.32 MIL/uL — ABNORMAL LOW (ref 4.22–5.81)
RDW: 13.3 % (ref 11.5–15.5)
WBC: 5.4 10*3/uL (ref 4.0–10.5)
nRBC: 0 % (ref 0.0–0.2)

## 2018-11-26 LAB — VITAMIN B12: Vitamin B-12: 312 pg/mL (ref 180–914)

## 2018-11-26 LAB — RETIC PANEL
Immature Retic Fract: 12.3 % (ref 2.3–15.9)
RBC.: 3.32 MIL/uL — ABNORMAL LOW (ref 4.22–5.81)
Retic Count, Absolute: 71.4 10*3/uL (ref 19.0–186.0)
Retic Ct Pct: 2.2 % (ref 0.4–3.1)
Reticulocyte Hemoglobin: 38.2 pg (ref >27.9)

## 2018-11-26 LAB — PSA: Prostatic Specific Antigen: 4.29 ng/mL — ABNORMAL HIGH (ref 0.00–4.00)

## 2018-11-26 MED ORDER — CYANOCOBALAMIN 1000 MCG/ML IJ SOLN
1000.0000 ug | Freq: Once | INTRAMUSCULAR | Status: AC
  Administered 2018-11-26: 10:00:00 1000 ug via INTRAMUSCULAR

## 2018-11-27 ENCOUNTER — Inpatient Hospital Stay (HOSPITAL_BASED_OUTPATIENT_CLINIC_OR_DEPARTMENT_OTHER): Payer: Medicare Other | Admitting: Oncology

## 2018-11-27 ENCOUNTER — Encounter: Payer: Self-pay | Admitting: Oncology

## 2018-11-27 DIAGNOSIS — Z5181 Encounter for therapeutic drug level monitoring: Secondary | ICD-10-CM

## 2018-11-27 DIAGNOSIS — Z79899 Other long term (current) drug therapy: Secondary | ICD-10-CM

## 2018-11-27 DIAGNOSIS — N183 Chronic kidney disease, stage 3 (moderate): Secondary | ICD-10-CM | POA: Diagnosis not present

## 2018-11-27 DIAGNOSIS — D519 Vitamin B12 deficiency anemia, unspecified: Secondary | ICD-10-CM

## 2018-11-27 DIAGNOSIS — E538 Deficiency of other specified B group vitamins: Secondary | ICD-10-CM

## 2018-11-27 DIAGNOSIS — Z79818 Long term (current) use of other agents affecting estrogen receptors and estrogen levels: Secondary | ICD-10-CM

## 2018-11-27 DIAGNOSIS — D631 Anemia in chronic kidney disease: Secondary | ICD-10-CM

## 2018-11-27 DIAGNOSIS — Z5111 Encounter for antineoplastic chemotherapy: Secondary | ICD-10-CM

## 2018-11-27 DIAGNOSIS — C61 Malignant neoplasm of prostate: Secondary | ICD-10-CM

## 2018-11-27 MED FILL — ABIRATERONE ACETATE 250 MG: 250 | 30 days supply | Qty: 90 | Fill #0

## 2018-11-27 NOTE — Progress Notes (Signed)
Patient contacted via phone for televisit. No concerns voiced.

## 2018-11-28 ENCOUNTER — Ambulatory Visit: Payer: Medicare Other | Admitting: Oncology

## 2018-11-28 NOTE — Progress Notes (Signed)
HEMATOLOGY-ONCOLOGY TeleHEALTH VISIT PROGRESS NOTE  I connected with Charles Flores on 11/28/18 at  9:45 AM EDT by telephone visit and verified that I am speaking with the correct person using two identifiers. I discussed the limitations, risks, security and privacy concerns of performing an evaluation and management service by telemedicine and the availability of in-person appointments. I also discussed with the patient that there may be a patient responsible charge related to this service. The patient expressed understanding and agreed to proceed.   Other persons participating in the visit and their role in the encounter:  Janeann Merl, RN, check in patient.   Patient's location: Home  Provider's location: work  Risk analyst Complaint: follow up for management of chemotherapy for prostate caner.    INTERVAL HISTORY Charles Flores is a 85 y.o. male who has above history reviewed by me today presents for follow up visit for management of chemotherapy treatment of prostate cancer and vitamin b12 deficiency.  Problems and complaints are listed below:  Prostate cancer, patient reports being complaint taking Zytiga 75mg  daily along with prednisone 5mg  BID.  Reports tolerating well. Blood pressure was 110/70 on 11/08/2018 when he saw pcp.  He was offered webex video visit and he declined. He does not have video capacity.  Denies any pain.  Denies any urinary symptoms, fever, chills, headache, chest pain, sob or abdominal pain.  Chronic fatigue at baseline.   He has also been getting monthly B12 injections.  Leupron 30mg  was given on 10/14/2018  He is on midodrine for chronic hypotension managed by PCP.   Review of Systems  Constitutional: Positive for fatigue. Negative for appetite change, chills, fever and unexpected weight change.  HENT:   Negative for hearing loss and voice change.   Eyes: Negative for eye problems and icterus.  Respiratory: Negative for chest tightness, cough and shortness of  breath.   Cardiovascular: Negative for chest pain.  Gastrointestinal: Negative for abdominal distention.  Endocrine: Negative for hot flashes.  Genitourinary: Negative for difficulty urinating, dysuria and frequency.   Musculoskeletal: Negative for arthralgias.  Skin: Negative for itching.  Neurological: Negative for light-headedness and numbness.  Hematological: Negative for adenopathy.  Psychiatric/Behavioral: Negative for confusion.    Past Medical History:  Diagnosis Date  . Anemia 12/24/2014  . Arthritis   . B12 deficiency anemia 01/01/2015  . Cataract   . Coronary artery disease   . GERD (gastroesophageal reflux disease)   . Hyperlipidemia   . Hyperlipidemia   . Prostate cancer ([place])   . Renal insufficiency 04/15/2017  . Thyroid nodule 03/14/2016   Past Surgical History:  Procedure Laterality Date  . PROSTATE BIOPSY      Family History  Problem Relation Age of Onset  . Diabetes Sister   . Stroke Sister   . Gastric cancer Sister   . Cancer Mother   . Breast cancer Mother   . Kidney disease Father     Social History   Socioeconomic History  . Marital status: Single    Spouse name: Not on file  . Number of children: Not on file  . Years of education: Not on file  . Highest education level: Not on file  Occupational History  . Not on file  Social Needs  . Financial resource strain: Not on file  . Food insecurity:    Worry: Not on file    Inability: Not on file  . Transportation needs:    Medical: Not on file    Non-medical: Not on file  Tobacco Use  . Smoking status: Former Smoker    Packs/day: 0.50    Years: 15.00    Pack years: 7.50    Types: Cigarettes  . Smokeless tobacco: Never Used  . Tobacco comment: quit 40 years  Substance and Sexual Activity  . Alcohol use: No    Alcohol/week: 0.0 standard drinks  . Drug use: No  . Sexual activity: Yes    Partners: Male  Lifestyle  . Physical activity:    Days per week: Not on file    Minutes per  session: Not on file  . Stress: Not on file  Relationships  . Social connections:    Talks on phone: Not on file    Gets together: Not on file    Attends religious service: Not on file    Active member of club or organization: Not on file    Attends meetings of clubs or organizations: Not on file    Relationship status: Not on file  . Intimate partner violence:    Fear of current or ex partner: Not on file    Emotionally abused: Not on file    Physically abused: Not on file    Forced sexual activity: Not on file  Other Topics Concern  . Not on file  Social History Narrative  . Not on file    Current Outpatient Medications on File Prior to Visit  Medication Sig Dispense Refill  . abiraterone acetate (ZYTIGA) 250 MG tablet TAKE 3 TABLETS (750 MG TOTAL) BY MOUTH DAILY. TAKE ON AN EMPTY STOMACH 1 HOUR BEFORE OR 2 HOURS AFTER A MEAL 90 tablet 0  . midodrine (PROAMATINE) 2.5 MG tablet Take 1 tablet (2.5 mg total) by mouth 3 (three) times daily with meals. 270 tablet 2  . omeprazole (PRILOSEC) 20 MG capsule Take 1 capsule (20 mg total) by mouth daily. 30 capsule 2  . predniSONE (DELTASONE) 5 MG tablet Take 1 tablet (5 mg total) by mouth 2 (two) times daily with a meal. 60 tablet 3   No current facility-administered medications on file prior to visit.     No Known Allergies     Observations/Objective: There were no vitals filed for this visit. There is no height or weight on file to calculate BMI.   CBC    Component Value Date/Time   WBC 5.4 11/26/2018 0929   RBC 3.32 (L) 11/26/2018 0929   RBC 3.32 (L) 11/26/2018 0929   HGB 11.4 (L) 11/26/2018 0929   HCT 33.3 (L) 11/26/2018 0929   PLT 142 (L) 11/26/2018 0929   MCV 100.3 (H) 11/26/2018 0929   MCH 34.3 (H) 11/26/2018 0929   MCHC 34.2 11/26/2018 0929   RDW 13.3 11/26/2018 0929   LYMPHSABS 0.8 11/26/2018 0929   MONOABS 0.5 11/26/2018 0929   EOSABS 0.0 11/26/2018 0929   BASOSABS 0.0 11/26/2018 0929    CMP     Component  Value Date/Time   NA 140 11/26/2018 0929   K 3.8 11/26/2018 0929   CL 109 11/26/2018 0929   CO2 23 11/26/2018 0929   GLUCOSE 101 (H) 11/26/2018 0929   BUN 26 (H) 11/26/2018 0929   CREATININE 1.58 (H) 11/26/2018 0929   CALCIUM 9.3 11/26/2018 0929   PROT 6.9 11/26/2018 0929   ALBUMIN 4.0 11/26/2018 0929   AST 18 11/26/2018 0929   ALT 14 11/26/2018 0929   ALKPHOS 63 11/26/2018 0929   BILITOT 1.2 11/26/2018 0929   GFRNONAA 38 (L) 11/26/2018 0929   GFRAA 44 (L)  11/26/2018 0929     Assessment and Plan: 1. Prostate cancer ([place])   2. B12 deficiency   3. Encounter for monitoring Lupron therapy   4. Encounter for antineoplastic chemotherapy   5. Anemia due to stage 3 chronic kidney disease ([place])   6. Anemia due to vitamin B12 deficiency, unspecified B12 deficiency type   7. Androgen deprivation therapy     Labs are reviewed and discussed with patient.  He tolerates Zytiga with prednisone well.  PSA continues to trend down, good response to treatment.  Continue Zytiga 750mg  daily with prednisone 5mg  BID.   # Vitamin B12 deficiency, continue B12 injection monthly.  # Anemia due to CKD, hemoglobin stable.  # Androgen deprivation therapy: last leupron is 10/14/2018, due in June 2020.   Follow Up Instructions: Follow up in clinic with lab and MD assessment in 6 weeks.   I discussed the assessment and treatment plan with the patient. The patient was provided an opportunity to ask questions and all were answered. The patient agreed with the plan and demonstrated an understanding of the instructions.  The patient was advised to call back or seek an in-person evaluation if the symptoms worsen or if the condition fails to improve as anticipated.   I provided 16 minutes of non face-to-face telephone visit time during this encounter, and > 50% was spent counseling as documented under my assessment & plan.  Earlie Server, MD 11/28/2018 12:50 PM

## 2018-12-04 ENCOUNTER — Telehealth: Payer: Self-pay | Admitting: Pharmacist

## 2018-12-04 MED FILL — predniSONE 5 MG TABS: 5 | 30 days supply | Qty: 60 | Fill #2

## 2018-12-04 NOTE — Telephone Encounter (Signed)
Oral Chemotherapy Pharmacist Encounter   Patient called me said he needed more prednisone to go along with his Zytiga. Coordinated with [facility] for his prednisone refill to be sent out today 12/04/18.  Darl Pikes, PharmD, BCPS, [facility] Hematology/Oncology Clinical Pharmacist [facility] [phone]  12/04/2018 12:06 PM

## 2018-12-20 ENCOUNTER — Other Ambulatory Visit: Payer: Self-pay | Admitting: Oncology

## 2019-01-06 ENCOUNTER — Other Ambulatory Visit: Payer: Self-pay | Admitting: Oncology

## 2019-01-06 DIAGNOSIS — C61 Malignant neoplasm of prostate: Secondary | ICD-10-CM

## 2019-01-06 MED FILL — predniSONE 5 MG TABS: 5 | 30 days supply | Qty: 60 | Fill #3

## 2019-01-06 MED FILL — ABIRATERONE ACETATE 250 MG: 250 | 30 days supply | Qty: 90 | Fill #0

## 2019-01-07 ENCOUNTER — Other Ambulatory Visit: Payer: Self-pay

## 2019-01-08 ENCOUNTER — Other Ambulatory Visit: Payer: Self-pay

## 2019-01-08 ENCOUNTER — Inpatient Hospital Stay: Payer: Medicare Other | Attending: Oncology

## 2019-01-08 DIAGNOSIS — N189 Chronic kidney disease, unspecified: Secondary | ICD-10-CM | POA: Insufficient documentation

## 2019-01-08 DIAGNOSIS — E538 Deficiency of other specified B group vitamins: Secondary | ICD-10-CM | POA: Insufficient documentation

## 2019-01-08 DIAGNOSIS — Z87891 Personal history of nicotine dependence: Secondary | ICD-10-CM | POA: Insufficient documentation

## 2019-01-08 DIAGNOSIS — D631 Anemia in chronic kidney disease: Secondary | ICD-10-CM | POA: Diagnosis not present

## 2019-01-08 DIAGNOSIS — C61 Malignant neoplasm of prostate: Secondary | ICD-10-CM | POA: Diagnosis present

## 2019-01-08 DIAGNOSIS — Z79899 Other long term (current) drug therapy: Secondary | ICD-10-CM | POA: Diagnosis not present

## 2019-01-08 DIAGNOSIS — I2510 Atherosclerotic heart disease of native coronary artery without angina pectoris: Secondary | ICD-10-CM | POA: Diagnosis not present

## 2019-01-08 LAB — COMPREHENSIVE METABOLIC PANEL
ALT: 13 U/L (ref 0–44)
AST: 17 U/L (ref 15–41)
Albumin: 4 g/dL (ref 3.5–5.0)
Alkaline Phosphatase: 61 U/L (ref 38–126)
Anion gap: 10 (ref 5–15)
BUN: 25 mg/dL — ABNORMAL HIGH (ref 8–23)
CO2: 23 mmol/L (ref 22–32)
Calcium: 9.4 mg/dL (ref 8.9–10.3)
Chloride: 106 mmol/L (ref 98–111)
Creatinine, Ser: 1.64 mg/dL — ABNORMAL HIGH (ref 0.61–1.24)
GFR calc Af Amer: 42 mL/min — ABNORMAL LOW (ref >60)
GFR calc non Af Amer: 37 mL/min — ABNORMAL LOW (ref >60)
Glucose, Bld: 118 mg/dL — ABNORMAL HIGH (ref 70–99)
Potassium: 4.1 mmol/L (ref 3.5–5.1)
Sodium: 139 mmol/L (ref 135–145)
Total Bilirubin: 1.3 mg/dL — ABNORMAL HIGH (ref 0.3–1.2)
Total Protein: 6.9 g/dL (ref 6.5–8.1)

## 2019-01-08 LAB — CBC WITH DIFFERENTIAL/PLATELET
Abs Immature Granulocytes: 0.01 10*3/uL (ref 0.00–0.07)
Basophils Absolute: 0 10*3/uL (ref 0.0–0.1)
Basophils Relative: 0 %
Eosinophils Absolute: 0 10*3/uL (ref 0.0–0.5)
Eosinophils Relative: 0 %
HCT: 33.1 % — ABNORMAL LOW (ref 39.0–52.0)
Hemoglobin: 11.5 g/dL — ABNORMAL LOW (ref 13.0–17.0)
Immature Granulocytes: 0 %
Lymphocytes Relative: 18 %
Lymphs Abs: 0.8 10*3/uL (ref 0.7–4.0)
MCH: 34.7 pg — ABNORMAL HIGH (ref 26.0–34.0)
MCHC: 34.7 g/dL (ref 30.0–36.0)
MCV: 100 fL (ref 80.0–100.0)
Monocytes Absolute: 0.4 10*3/uL (ref 0.1–1.0)
Monocytes Relative: 9 %
Neutro Abs: 3.3 10*3/uL (ref 1.7–7.7)
Neutrophils Relative %: 73 %
Platelets: 174 10*3/uL (ref 150–400)
RBC: 3.31 MIL/uL — ABNORMAL LOW (ref 4.22–5.81)
RDW: 12.9 % (ref 11.5–15.5)
WBC: 4.6 10*3/uL (ref 4.0–10.5)
nRBC: 0 % (ref 0.0–0.2)

## 2019-01-08 LAB — PSA: Prostatic Specific Antigen: 3.15 ng/mL (ref 0.00–4.00)

## 2019-01-09 ENCOUNTER — Encounter: Payer: Self-pay | Admitting: Oncology

## 2019-01-09 ENCOUNTER — Inpatient Hospital Stay (HOSPITAL_BASED_OUTPATIENT_CLINIC_OR_DEPARTMENT_OTHER): Payer: Medicare Other | Admitting: Oncology

## 2019-01-09 ENCOUNTER — Other Ambulatory Visit: Payer: Self-pay

## 2019-01-09 DIAGNOSIS — C61 Malignant neoplasm of prostate: Secondary | ICD-10-CM

## 2019-01-09 DIAGNOSIS — Z5111 Encounter for antineoplastic chemotherapy: Secondary | ICD-10-CM

## 2019-01-09 DIAGNOSIS — E538 Deficiency of other specified B group vitamins: Secondary | ICD-10-CM

## 2019-01-09 DIAGNOSIS — Z79899 Other long term (current) drug therapy: Secondary | ICD-10-CM | POA: Diagnosis not present

## 2019-01-09 DIAGNOSIS — Z87891 Personal history of nicotine dependence: Secondary | ICD-10-CM | POA: Diagnosis not present

## 2019-01-09 NOTE — Progress Notes (Signed)
HEMATOLOGY-ONCOLOGY TeleHEALTH VISIT PROGRESS NOTE  I connected with Charles Flores on 01/09/19 at 10:15 AM EDT by telephone visit and verified that I am speaking with the correct person using two identifiers. I discussed the limitations, risks, security and privacy concerns of performing an evaluation and management service by telemedicine and the availability of in-person appointments. I also discussed with the patient that there may be a patient responsible charge related to this service. The patient expressed understanding and agreed to proceed.   Other persons participating in the visit and their role in the encounter:  Janeann Merl, RN, check in patient.   Patient's location: Home  Provider's location: work  Risk analyst Complaint: follow up for management of chemotherapy for prostate caner.    INTERVAL HISTORY Charles Flores is a 85 y.o. male who has above history reviewed by me today presents for follow up visit for prostate cancer and vitamin B12 deficiency.  Patient has been taking Zytiga 75 mg daily along with prednisone 5 mg twice daily. Reports feeling well. Denies any pain, urinary symptoms, fever, chills, headache, chest pain, shortness of breath or abdominal pain. Patient is on midodrine for chronic hypotension managed by PCP.  Today he voices no new concerns..   Review of Systems  Constitutional: Positive for fatigue. Negative for appetite change, chills, fever and unexpected weight change.  HENT:   Negative for hearing loss and voice change.   Eyes: Negative for eye problems and icterus.  Respiratory: Negative for chest tightness, cough and shortness of breath.   Cardiovascular: Negative for chest pain and leg swelling.  Gastrointestinal: Negative for abdominal distention and abdominal pain.  Endocrine: Negative for hot flashes.  Genitourinary: Negative for difficulty urinating, dysuria and frequency.   Musculoskeletal: Negative for arthralgias.  Skin: Negative for itching  and rash.  Neurological: Negative for light-headedness and numbness.  Hematological: Negative for adenopathy. Does not bruise/bleed easily.  Psychiatric/Behavioral: Negative for confusion.    Past Medical History:  Diagnosis Date  . Anemia 12/24/2014  . Arthritis   . B12 deficiency anemia 01/01/2015  . Cataract   . Coronary artery disease   . GERD (gastroesophageal reflux disease)   . Hyperlipidemia   . Hyperlipidemia   . Prostate cancer ([place])   . Renal insufficiency 04/15/2017  . Thyroid nodule 03/14/2016   Past Surgical History:  Procedure Laterality Date  . PROSTATE BIOPSY      Family History  Problem Relation Age of Onset  . Diabetes Sister   . Stroke Sister   . Gastric cancer Sister   . Cancer Mother   . Breast cancer Mother   . Kidney disease Father     Social History   Socioeconomic History  . Marital status: Single    Spouse name: Not on file  . Number of children: Not on file  . Years of education: Not on file  . Highest education level: Not on file  Occupational History  . Not on file  Social Needs  . Financial resource strain: Not on file  . Food insecurity:    Worry: Not on file    Inability: Not on file  . Transportation needs:    Medical: Not on file    Non-medical: Not on file  Tobacco Use  . Smoking status: Former Smoker    Packs/day: 0.50    Years: 15.00    Pack years: 7.50    Types: Cigarettes  . Smokeless tobacco: Never Used  . Tobacco comment: quit 40 years  Substance and Sexual  Activity  . Alcohol use: No    Alcohol/week: 0.0 standard drinks  . Drug use: No  . Sexual activity: Yes    Partners: Male  Lifestyle  . Physical activity:    Days per week: Not on file    Minutes per session: Not on file  . Stress: Not on file  Relationships  . Social connections:    Talks on phone: Not on file    Gets together: Not on file    Attends religious service: Not on file    Active member of club or organization: Not on file    Attends  meetings of clubs or organizations: Not on file    Relationship status: Not on file  . Intimate partner violence:    Fear of current or ex partner: Not on file    Emotionally abused: Not on file    Physically abused: Not on file    Forced sexual activity: Not on file  Other Topics Concern  . Not on file  Social History Narrative  . Not on file    Current Outpatient Medications on File Prior to Visit  Medication Sig Dispense Refill  . abiraterone acetate (ZYTIGA) 250 MG tablet TAKE 3 TABLETS BY MOUTH DAILY. TAKE ON AN EMPTY STOMACH 1 HOUR BEFORE OR 2 HOURS AFTER A MEAL 90 tablet 0  . midodrine (PROAMATINE) 2.5 MG tablet Take 1 tablet (2.5 mg total) by mouth 3 (three) times daily with meals. 270 tablet 2  . omeprazole (PRILOSEC) 20 MG capsule TAKE 1 CAPSULE BY MOUTH EVERY DAY 90 capsule 0  . predniSONE (DELTASONE) 5 MG tablet Take 1 tablet (5 mg total) by mouth 2 (two) times daily with a meal. 60 tablet 3   No current facility-administered medications on file prior to visit.     No Known Allergies     Observations/Objective: Today's Vitals   01/09/19 0954  PainSc: 0-No pain   There is no height or weight on file to calculate BMI.   CBC    Component Value Date/Time   WBC 4.6 01/08/2019 1044   RBC 3.31 (L) 01/08/2019 1044   HGB 11.5 (L) 01/08/2019 1044   HCT 33.1 (L) 01/08/2019 1044   PLT 174 01/08/2019 1044   MCV 100.0 01/08/2019 1044   MCH 34.7 (H) 01/08/2019 1044   MCHC 34.7 01/08/2019 1044   RDW 12.9 01/08/2019 1044   LYMPHSABS 0.8 01/08/2019 1044   MONOABS 0.4 01/08/2019 1044   EOSABS 0.0 01/08/2019 1044   BASOSABS 0.0 01/08/2019 1044    CMP     Component Value Date/Time   NA 139 01/08/2019 1044   K 4.1 01/08/2019 1044   CL 106 01/08/2019 1044   CO2 23 01/08/2019 1044   GLUCOSE 118 (H) 01/08/2019 1044   BUN 25 (H) 01/08/2019 1044   CREATININE 1.64 (H) 01/08/2019 1044   CALCIUM 9.4 01/08/2019 1044   PROT 6.9 01/08/2019 1044   ALBUMIN 4.0 01/08/2019 1044    AST 17 01/08/2019 1044   ALT 13 01/08/2019 1044   ALKPHOS 61 01/08/2019 1044   BILITOT 1.3 (H) 01/08/2019 1044   GFRNONAA 37 (L) 01/08/2019 1044   GFRAA 42 (L) 01/08/2019 1044     Assessment and Plan: 1. Prostate cancer ([place])   2. B12 deficiency   3. Encounter for antineoplastic chemotherapy   Prostate cancer Labs are reviewed and discussed with patient. Tolerating Zytiga with prednisone well. AST has normalized. Good response to treatment. Continue current regimen.  Vitamin B12 deficiency,  continue B12 injections monthly.  We will schedule at the next visit. Anemia secondary to CKD.  Hemoglobin stable. IgG deprivation therapy last Lupron was 10/14/2018, due in June 2020.   Follow Up Instructions: Follow up in clinic with lab and MD assessment in 4 weeks  I discussed the assessment and treatment plan with the patient. The patient was provided an opportunity to ask questions and all were answered. The patient agreed with the plan and demonstrated an understanding of the instructions.  The patient was advised to call back or seek an in-person evaluation if the symptoms worsen or if the condition fails to improve as anticipated.   I provided 11 minutes of non face-to-face telephone visit time during this encounter, and > 50% was spent counseling as documented under my assessment & plan.  Earlie Server, MD 01/09/2019 11:09 PM

## 2019-01-09 NOTE — Progress Notes (Signed)
Patient contacted for telehealth visit. No concerns voiced.  

## 2019-01-21 ENCOUNTER — Other Ambulatory Visit: Payer: Self-pay

## 2019-01-21 ENCOUNTER — Inpatient Hospital Stay: Payer: Medicare Other | Attending: Oncology

## 2019-01-21 DIAGNOSIS — Z87891 Personal history of nicotine dependence: Secondary | ICD-10-CM | POA: Diagnosis not present

## 2019-01-21 DIAGNOSIS — C61 Malignant neoplasm of prostate: Secondary | ICD-10-CM | POA: Insufficient documentation

## 2019-01-21 DIAGNOSIS — E538 Deficiency of other specified B group vitamins: Secondary | ICD-10-CM | POA: Insufficient documentation

## 2019-01-21 DIAGNOSIS — N189 Chronic kidney disease, unspecified: Secondary | ICD-10-CM | POA: Insufficient documentation

## 2019-01-21 DIAGNOSIS — Z5111 Encounter for antineoplastic chemotherapy: Secondary | ICD-10-CM | POA: Insufficient documentation

## 2019-01-21 DIAGNOSIS — Z79818 Long term (current) use of other agents affecting estrogen receptors and estrogen levels: Secondary | ICD-10-CM | POA: Insufficient documentation

## 2019-01-21 DIAGNOSIS — D519 Vitamin B12 deficiency anemia, unspecified: Secondary | ICD-10-CM

## 2019-01-21 DIAGNOSIS — D631 Anemia in chronic kidney disease: Secondary | ICD-10-CM | POA: Insufficient documentation

## 2019-01-21 DIAGNOSIS — E46 Unspecified protein-calorie malnutrition: Secondary | ICD-10-CM | POA: Insufficient documentation

## 2019-01-21 MED ORDER — LEUPROLIDE ACETATE (4 MONTH) 30 MG IM KIT
30.0000 mg | PACK | Freq: Once | INTRAMUSCULAR | Status: AC
  Administered 2019-01-21: 13:00:00 30 mg via INTRAMUSCULAR

## 2019-01-29 ENCOUNTER — Other Ambulatory Visit: Payer: Self-pay | Admitting: Oncology

## 2019-01-29 DIAGNOSIS — C61 Malignant neoplasm of prostate: Secondary | ICD-10-CM

## 2019-01-29 NOTE — Telephone Encounter (Signed)
CBC with Differential/Platelet  Order: [id number]  Status:  Final result  Visible to patient:  No (Not Released)  Next appt:  02/10/2019 at 01:30 PM in Oncology ([facility])  Dx:  Prostate cancer ([place])   Ref Range & Units 3wk ago (01/08/19) 32mo ago (11/26/18) 73mo ago (09/27/18) 33mo ago (09/05/18) 31mo ago (06/13/18) 23mo ago (03/14/18) 25yr ago (12/06/17)  WBC 4.0 - 10.5 K/uL 4.6  5.4  5.0  4.1  3.5Low   3.3Low  R 4.1 R  RBC 4.22 - 5.81 MIL/uL 3.31Low   3.32Low   3.30Low   3.33Low   3.38Low   3.54Low  R 3.52Low  R  Hemoglobin 13.0 - 17.0 g/dL 11.5Low   11.4Low   10.9Low   11.0Low   11.1Low   11.9Low  R 11.9Low  R  HCT 39.0 - 52.0 % 33.1Low   33.3Low   32.9Low   32.9Low   33.1Low   35.1Low  R 34.2Low  R  MCV 80.0 - 100.0 fL 100.0  100.3High   99.7  98.8  97.9  99.0  97.2   MCH 26.0 - 34.0 pg 34.7High   34.3High   33.0  33.0  32.8  33.7  33.8   MCHC 30.0 - 36.0 g/dL 34.7  34.2  33.1  33.4  33.5  34.1 R 34.8 R  RDW 11.5 - 15.5 % 12.9  13.3  13.2  13.1  12.5  13.6 R 13.7 R  Platelets 150 - 400 K/uL 174  142Low   134Low   176  171  186 R 194 R  nRBC 0.0 - 0.2 % 0.0  0.0  0.0  0.0  0.0     Neutrophils Relative % % 73  76  75  52  52  52  62   Neutro Abs 1.7 - 7.7 K/uL 3.3  4.1  3.8  2.1  1.8  1.7 R 2.5 R  Lymphocytes Relative % 18  14  16  30  27  30  23    Lymphs Abs 0.7 - 4.0 K/uL 0.8  0.8  0.8  1.3  0.9  1.0 R 0.9Low  R  Monocytes Relative % 9  8  8  13  14  12  11    Monocytes Absolute 0.1 - 1.0 K/uL 0.4  0.5  0.4  0.5  0.5  0.4 R 0.5 R  Eosinophils Relative % 0  1  1  4  6  5  3    Eosinophils Absolute 0.0 - 0.5 K/uL 0.0  0.0  0.0  0.2  0.2  0.2 R 0.1 R  Basophils Relative % 0  1  0  1  1  1  1    Basophils Absolute 0.0 - 0.1 K/uL 0.0  0.0  0.0  0.1  0.0  0.0 R, CM 0.0 R, CM  Immature Granulocytes % 0  0  0  0  0     Abs Immature Granulocytes 0.00 - 0.07 K/uL 0.01  0.02 CM 0.01 CM 0.00 CM 0.00 CM    Comment: Performed at [facility], [address] [place], [place] [address]  Resulting  Agency  [facility] CL[facility] LAB [place] [facility] [place] [facility] [place] [facility] [place] [facility] [place] [facility]      Specimen Collected: 01/08/19 10:44  Last Resulted: 01/08/19 11:07     Lab Flowsheet    Order Details    View Encounter    Lab and  Collection Details    Routing    Result History      CM=Additional commentsR=Reference range differs from displayed range      Other Results from 01/08/2019   PSA  Order: [id number]   Status:  Final result  Visible to patient:  No (Not Released)  Next appt:  02/10/2019 at 01:30 PM in Oncology ([facility])  Dx:  Prostate cancer ([place])   Ref Range & Units 3wk ago (01/08/19) 72mo ago (11/26/18) 39mo ago (09/27/18) 29mo ago (09/05/18) 83mo ago (06/13/18) 39mo ago (03/14/18) 19yr ago (12/06/17)  Prostatic Specific Antigen 0.00 - 4.00 ng/mL 3.15  4.29High  CM 22.78High  CM 73.04High  CM 31.24High  CM 34.56High  CM 30.23High  CM  Comment: (NOTE)  While PSA levels of <=4.0 ng/ml are reported as reference range, some  men with levels below 4.0 ng/ml can have prostate cancer and many men  with PSA above 4.0 ng/ml do not have prostate cancer. Other tests  such as free PSA, age specific reference ranges, PSA velocity and PSA  doubling time may be helpful especially in men less than 27 years  old.  Performed at [facility], [place] [address] [place], [place]  [address]   Resulting Agency  [place] [facility] [place] [facility] [place] [facility] [place] [facility] [place] [facility] [place] [facility] [place] [facility]      Specimen Collected: 01/08/19 10:44  Last Resulted: 01/08/19 13:30     Lab Flowsheet    Order Details    View Encounter    Lab and Collection Details    Routing    Result History      CM=Additional comments        Contains abnormal data Comprehensive metabolic panel  Order: [id number]   Status:  Final result  Visible to patient:  No (Not Released)  Next appt:  02/10/2019 at 01:30 PM in Oncology ([facility])  Dx:  Prostate cancer ([place])   Ref Range & Units 3wk  ago (01/08/19) 62mo ago (11/26/18) 38mo ago (09/27/18) 6mo ago (09/05/18) 28mo ago (06/13/18) 78mo ago (03/14/18) 61yr ago (12/06/17)  Sodium 135 - 145 mmol/L 139  140  139  141  138  138  136   Potassium 3.5 - 5.1 mmol/L 4.1  3.8  3.9  4.3  4.2  4.5  4.2   Chloride 98 - 111 mmol/L 106  109  107  107  107  106  107 R  CO2 22 - 32 mmol/L 23  23  28  26  26  22  22    Glucose, Bld 70 - 99 mg/dL 118High   101High   102High   93  94  105High   95 R  BUN 8 - 23 mg/dL 25High   26High   26High   25High   22  26High   22High  R  Creatinine, Ser 0.61 - 1.24 mg/dL 1.64High   1.58High   1.49High   1.55High   1.42High   1.51High   1.39High    Calcium 8.9 - 10.3 mg/dL 9.4  9.3  9.1  9.3  9.4  9.5  9.6   Total Protein 6.5 - 8.1 g/dL 6.9  6.9  7.1  7.2      Albumin 3.5 - 5.0 g/dL 4.0  4.0  4.0  4.1      AST 15 - 41 U/L 17  18  17  16       ALT 0 -  44 U/L 13  14  13  10       Alkaline Phosphatase 38 - 126 U/L 61  63  65  55      Total Bilirubin 0.3 - 1.2 mg/dL 1.3High   1.2  0.9  1.0      GFR calc non Af Amer >60 mL/min 37Low   38Low   41Low   39Low   42Low   39Low   44Low    GFR calc Af Amer >60 mL/min 42Low   44Low   48Low   45Low   49Low  CM 45Low  CM 51Low  CM  Anion gap 5 - 15 10  8  CM 4Low  CM 8 CM 5 CM 10 CM 7 CM  Comment: Performed at [facility], [address] [place], [place] [address]  Resulting Agency  [facility] CLIN LAB [place] CLIN LAB [place] [facility] LAB [place] [facility] [place] [facility] [place] [facility] [place] [facility]      Specimen Collected: 01/08/19 10:44  Last Resulted: 01/08/19 11:29

## 2019-01-30 MED FILL — ABIRATERONE ACETATE 250 MG: 250 | 30 days supply | Qty: 90 | Fill #0

## 2019-01-30 MED FILL — predniSONE 5 MG TABS: 5 | 30 days supply | Qty: 60 | Fill #0

## 2019-02-07 ENCOUNTER — Telehealth: Payer: Self-pay | Admitting: Oncology

## 2019-02-07 NOTE — Telephone Encounter (Signed)
Called pt for appt pre-screen but got no answer and no vm msg option °

## 2019-02-10 ENCOUNTER — Encounter: Payer: Self-pay | Admitting: Oncology

## 2019-02-10 ENCOUNTER — Other Ambulatory Visit: Payer: Self-pay

## 2019-02-10 ENCOUNTER — Inpatient Hospital Stay: Payer: Medicare Other

## 2019-02-10 ENCOUNTER — Inpatient Hospital Stay (HOSPITAL_BASED_OUTPATIENT_CLINIC_OR_DEPARTMENT_OTHER): Payer: Medicare Other | Admitting: Oncology

## 2019-02-10 VITALS — BP 115/76 | HR 96 | Temp 99.5°F | Resp 18 | Wt 139.7 lb

## 2019-02-10 DIAGNOSIS — D631 Anemia in chronic kidney disease: Secondary | ICD-10-CM | POA: Diagnosis not present

## 2019-02-10 DIAGNOSIS — Z79818 Long term (current) use of other agents affecting estrogen receptors and estrogen levels: Secondary | ICD-10-CM

## 2019-02-10 DIAGNOSIS — C61 Malignant neoplasm of prostate: Secondary | ICD-10-CM | POA: Diagnosis not present

## 2019-02-10 DIAGNOSIS — D519 Vitamin B12 deficiency anemia, unspecified: Secondary | ICD-10-CM | POA: Diagnosis not present

## 2019-02-10 DIAGNOSIS — E46 Unspecified protein-calorie malnutrition: Secondary | ICD-10-CM

## 2019-02-10 DIAGNOSIS — N189 Chronic kidney disease, unspecified: Secondary | ICD-10-CM | POA: Diagnosis not present

## 2019-02-10 DIAGNOSIS — E538 Deficiency of other specified B group vitamins: Secondary | ICD-10-CM | POA: Diagnosis not present

## 2019-02-10 DIAGNOSIS — Z5111 Encounter for antineoplastic chemotherapy: Secondary | ICD-10-CM | POA: Diagnosis not present

## 2019-02-10 DIAGNOSIS — Z87891 Personal history of nicotine dependence: Secondary | ICD-10-CM | POA: Diagnosis not present

## 2019-02-10 LAB — COMPREHENSIVE METABOLIC PANEL
ALT: 13 U/L (ref 0–44)
AST: 16 U/L (ref 15–41)
Albumin: 3.8 g/dL (ref 3.5–5.0)
Alkaline Phosphatase: 55 U/L (ref 38–126)
Anion gap: 10 (ref 5–15)
BUN: 32 mg/dL — ABNORMAL HIGH (ref 8–23)
CO2: 24 mmol/L (ref 22–32)
Calcium: 9.2 mg/dL (ref 8.9–10.3)
Chloride: 106 mmol/L (ref 98–111)
Creatinine, Ser: 1.68 mg/dL — ABNORMAL HIGH (ref 0.61–1.24)
GFR calc Af Amer: 41 mL/min — ABNORMAL LOW (ref >60)
GFR calc non Af Amer: 35 mL/min — ABNORMAL LOW (ref >60)
Glucose, Bld: 118 mg/dL — ABNORMAL HIGH (ref 70–99)
Potassium: 4.1 mmol/L (ref 3.5–5.1)
Sodium: 140 mmol/L (ref 135–145)
Total Bilirubin: 1 mg/dL (ref 0.3–1.2)
Total Protein: 6.7 g/dL (ref 6.5–8.1)

## 2019-02-10 LAB — CBC WITH DIFFERENTIAL/PLATELET
Abs Immature Granulocytes: 0.01 10*3/uL (ref 0.00–0.07)
Basophils Absolute: 0 10*3/uL (ref 0.0–0.1)
Basophils Relative: 0 %
Eosinophils Absolute: 0 10*3/uL (ref 0.0–0.5)
Eosinophils Relative: 0 %
HCT: 30.8 % — ABNORMAL LOW (ref 39.0–52.0)
Hemoglobin: 10.4 g/dL — ABNORMAL LOW (ref 13.0–17.0)
Immature Granulocytes: 0 %
Lymphocytes Relative: 20 %
Lymphs Abs: 0.9 10*3/uL (ref 0.7–4.0)
MCH: 34.3 pg — ABNORMAL HIGH (ref 26.0–34.0)
MCHC: 33.8 g/dL (ref 30.0–36.0)
MCV: 101.7 fL — ABNORMAL HIGH (ref 80.0–100.0)
Monocytes Absolute: 0.5 10*3/uL (ref 0.1–1.0)
Monocytes Relative: 12 %
Neutro Abs: 2.9 10*3/uL (ref 1.7–7.7)
Neutrophils Relative %: 68 %
Platelets: 160 10*3/uL (ref 150–400)
RBC: 3.03 MIL/uL — ABNORMAL LOW (ref 4.22–5.81)
RDW: 12.9 % (ref 11.5–15.5)
WBC: 4.3 10*3/uL (ref 4.0–10.5)
nRBC: 0 % (ref 0.0–0.2)

## 2019-02-10 LAB — VITAMIN B12: Vitamin B-12: 269 pg/mL (ref 180–914)

## 2019-02-10 LAB — PSA: Prostatic Specific Antigen: 2.09 ng/mL (ref 0.00–4.00)

## 2019-02-11 NOTE — Progress Notes (Signed)
[facility] day:  02/11/2019    Chief Complaint: Charles Flores is an 85 y.o. male with prostate cancer and B12 deficiency who is seen for  3 month assessment.  Pertinent oncology history Patient previously follows up with Dr. Mike Gip.  He switched care to me on 09/05/2018. The patient is a 85 year old African American gentleman with prostate cancer and a rising PSA on Lupron.  Prostate cancer was discovered approximately 20 years secondary to an elevated PSA.  He underwent biopsy at [facility] (no report available). Initial PSA was 774.   I have personally reviewed the radiological images as listed and agreed with the findings in the report. Abdomen and pelvic CT on 01/05/2006 revealed an enlarged and heterogeneous prostate with mass effect on the base of the bladder.  There was no adenopathy.  Chest, abdomen, and pelvic CT on 03/15/2016 revealed moderate centrilobular emphysema.  There were mild lucent mottled appearance of the osseous structures (cannot exclude multiple myeloma).  There were scattered pulmonary nodules (4 mm or less in size).  There was a 2.7 cm low-attenuation left thyroid nodule.    Abdomen and pelvic CT on 12/07/2016 revealed no new or progressive findings.  There was no evidence of lymphadenopathy in the abdomen or pelvis. There was a stable appearance of heterogeneous/mottled bony mineralization.  Abdomen and pelvic CT on 11/27/2017 was stable with no evidence of adenopathy within the abdomen and pelvis.  There were no suspicious bone lesions.  There was a stable 3 mm RLL nodule.  Work-up on 03/17/2016 revealed no monoclonal protein.  SPEP revealed no monoclonal protein.  Kappa free light chains were 28.9, lambda free light chains 20.7 with a ratio of 1.40 (normal).  Immunoglobulins were normal (IgG 1391, IgA 282, IgM 29).  24 hour urine revealed no monoclonal protein. Thyroid ultrasound on 03/22/2016 revealed a  solitary 2.9 cm left lower pole nodule.  Thyroid biopsy on 04/14/2016 was benign (Bethesda category II) with abundant foamy macrophages and colloid. The cytologic findings were compatible with a cystic colloid nodule  Bone scan on 04/08/2014, 12/29/2014, 02/23/2016, 12/07/2016, and 11/27/2017 revealed no evidence of metastatic disease.  Patient has been on Lupron and Casodex.  He receives Lupron 30 mg every 4 months (last 12/06/2017).    PSA has been followed: 127.6 on 06/30/2014, 168.3 on 12/02/2014, 63.32 on 02/01/2015, 40.31 on 03/03/2015, 29.12 on 04/08/2015, 11.28 on 07/14/2015, 11.02 on 08/17/2015, 10.35 on 11/15/2015, 14.37 on 02/15/2016, 12.46 on 06/06/2016, 13.5 on 06/23/2016, 16.63 on 09/05/2016, 20.77 on 11/07/2016, 16.41 on 12/11/2016, 26.69 on 02/27/2017, 25.67 on 04/02/2017, 19.16 on 05/10/2017, 18.94 on 07/02/2017, 28.28 on 10/01/2017, 30.23 on 12/06/2017, and 34.56 on 03/14/2018.  He has a normocytic anemia. He was diagnosed with B12 deficiency on 12/24/2014. B12 was 169 (low).  He began B12 supplimentation on 02/01/2015 (last 11/15/2017).  Parietal cell antibody was positive on 07/02/2017.  Folate was 11 on 12/24/2014 and 12 on 06/06/2016.  Normal studies included a CMP, ferritin, iron studies, folate, and TSH. TSH and free T4 were normal on 04/02/2017.   # Limited abdominal ultrasound was done on 03/14/2018 revealed a 21.8 x 8.3 x 10.2 cm fatty mass with internal calcifications.  Findings felt to correspond to the CT findings on 11/2017, and not significantly changed from 12/2015 scan.  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  Neoplasm felt to be unlikely.  INTERVAL HISTORY Charles Flores is a 85 y.o.  male who has above history reviewed by me today presents for follow up visit for management of prostate cancer. He has been on Zytiga 752m Daily as well as prednisone 575mBID.  Reports doing well.  No new complaints. Denies any back pain.      Past Medical History:  Diagnosis Date  . Anemia 12/24/2014  . Arthritis   . B12 deficiency anemia 01/01/2015  . Cataract   . Coronary artery disease   . GERD (gastroesophageal reflux disease)   . Hyperlipidemia   . Hyperlipidemia   . Prostate cancer ([facility]  . Renal insufficiency 04/15/2017  . Thyroid nodule 03/14/2016    Past Surgical History:  Procedure Laterality Date  . PROSTATE BIOPSY      Family History  Problem Relation Age of Onset  . Diabetes Sister   . Stroke Sister   . Gastric cancer Sister   . Cancer Mother   . Breast cancer Mother   . Kidney disease Father    Social History   Socioeconomic History  . Marital status: Single    Spouse name: Not on file  . Number of children: Not on file  . Years of education: Not on file  . Highest education level: Not on file  Occupational History  . Not on file  Social Needs  . Financial resource strain: Not on file  . Food insecurity    Worry: Not on file    Inability: Not on file  . Transportation needs    Medical: Not on file    Non-medical: Not on file  Tobacco Use  . Smoking status: Former Smoker    Packs/day: 0.50    Years: 15.00    Pack years: 7.50    Types: Cigarettes  . Smokeless tobacco: Never Used  . Tobacco comment: quit 40 years  Substance and Sexual Activity  . Alcohol use: No    Alcohol/week: 0.0 standard drinks  . Drug use: No  . Sexual activity: Yes    Partners: Male  Lifestyle  . Physical activity    Days per week: Not on file    Minutes per session: Not on file  . Stress: Not on file  Relationships  . Social coHerbalistn phone: Not on file    Gets together: Not on file    Attends religious service: Not on file    Active member of club or organization: Not on file    Attends meetings of clubs or organizations: Not on file    Relationship status: Not on file  . Intimate partner violence    Fear of current or ex partner: Not on file    Emotionally abused: Not on file     Physically abused: Not on file    Forced sexual activity: Not on file  Other Topics Concern  . Not on file  Social History Narrative  . Not on file     Allergies: No Known Allergies  Current Outpatient Medications  Medication Sig Dispense Refill  . abiraterone acetate (ZYTIGA) 250 MG tablet TAKE 3 TABLETS BY MOUTH DAILY. TAKE ON AN EMPTY STOMACH 1 HOUR BEFORE OR 2 HOURS AFTER A MEAL 90 tablet 0  . midodrine (PROAMATINE) 2.5 MG tablet Take 1 tablet (2.5 mg total) by mouth 3 (three) times daily with meals. 270 tablet 2  . omeprazole (PRILOSEC) 20 MG capsule TAKE 1 CAPSULE BY MOUTH EVERY DAY 90 capsule 0  . predniSONE (DELTASONE) 5 MG tablet TAKE 1 TABLET (5  MG TOTAL) BY MOUTH 2 (TWO) TIMES DAILY WITH A MEAL. 60 tablet 3   No current facility-administered medications for this visit.    Review of Systems  Constitutional: Positive for fatigue. Negative for appetite change, chills, fever and unexpected weight change.  HENT:   Negative for hearing loss and voice change.   Eyes: Negative for eye problems and icterus.  Respiratory: Negative for chest tightness, cough and shortness of breath.   Cardiovascular: Negative for chest pain and leg swelling.  Gastrointestinal: Negative for abdominal distention and abdominal pain.  Endocrine: Negative for hot flashes.  Genitourinary: Negative for difficulty urinating, dysuria and frequency.   Musculoskeletal: Negative for arthralgias.  Skin: Negative for itching and rash.  Neurological: Negative for light-headedness and numbness.  Hematological: Negative for adenopathy. Does not bruise/bleed easily.  Psychiatric/Behavioral: Negative for confusion.    Performance status (ECOG): 1 - Symptomatic but completely ambulatory  Vital Signs BP 115/76 (BP Location: Left Arm)   Pulse 96   Temp 99.5 F (37.5 C) (Tympanic)   Resp 18   Wt 139 lb 11.2 oz (63.4 kg)   BMI 19.48 kg/m   Physical Exam  Constitutional: He is oriented to person, place,  and time. He appears healthy. No distress.  Thin gentleman  HENT:  Head: Normocephalic and atraumatic.  Nose: Nose normal.  Mouth/Throat: Oropharynx is clear and moist and mucous membranes are normal. He has dentures. No oropharyngeal exudate.  Eyes: Pupils are equal, round, and reactive to light. EOM are normal. No scleral icterus.  Brown eyes.  Slight ptosis.  Neck: Normal range of motion. Neck supple. No tracheal deviation present. No thyromegaly present.  Cardiovascular: Normal rate, regular rhythm, normal heart sounds and intact distal pulses. Exam reveals no gallop and no friction rub.  No murmur heard. Pulmonary/Chest: Effort normal and breath sounds normal. No respiratory distress. He has no wheezes. He has no rales. He exhibits no tenderness.  Abdominal: Soft. Bowel sounds are normal. He exhibits no distension. Mass: Large right flank mass: Previously ultrasound consistent with a lipomatous growth fat necrosis and resultant calcification. There is no abdominal tenderness.  Musculoskeletal: Normal range of motion.        General: No tenderness or edema.  Lymphadenopathy:    He has no cervical adenopathy.    He has no axillary adenopathy.       Right: No supraclavicular adenopathy present.       Left: No supraclavicular adenopathy present.  Neurological: He is alert and oriented to person, place, and time. No cranial nerve deficit. He exhibits normal muscle tone. Coordination normal.  Skin: Skin is warm and dry. No rash noted. He is not diaphoretic. No erythema.  Psychiatric: Mood, affect and judgment normal.  Nursing note and vitals reviewed.  CBC    Component Value Date/Time   WBC 4.3 02/10/2019 1322   RBC 3.03 (L) 02/10/2019 1322   HGB 10.4 (L) 02/10/2019 1322   HCT 30.8 (L) 02/10/2019 1322   PLT 160 02/10/2019 1322   MCV 101.7 (H) 02/10/2019 1322   MCH 34.3 (H) 02/10/2019 1322   MCHC 33.8 02/10/2019 1322   RDW 12.9 02/10/2019 1322   LYMPHSABS 0.9 02/10/2019 1322    MONOABS 0.5 02/10/2019 1322   EOSABS 0.0 02/10/2019 1322   BASOSABS 0.0 02/10/2019 1322   CMP Latest Ref Rng & Units 02/10/2019 01/08/2019 11/26/2018  Glucose 70 - 99 mg/dL 118(H) 118(H) 101(H)  BUN 8 - 23 mg/dL 32(H) 25(H) 26(H)  Creatinine 0.61 - 1.24 mg/dL  1.68(H) 1.64(H) 1.58(H)  Sodium 135 - 145 mmol/L 140 139 140  Potassium 3.5 - 5.1 mmol/L 4.1 4.1 3.8  Chloride 98 - 111 mmol/L 106 106 109  CO2 22 - 32 mmol/L '24 23 23  ' Calcium 8.9 - 10.3 mg/dL 9.2 9.4 9.3  Total Protein 6.5 - 8.1 g/dL 6.7 6.9 6.9  Total Bilirubin 0.3 - 1.2 mg/dL 1.0 1.3(H) 1.2  Alkaline Phos 38 - 126 U/L 55 61 63  AST 15 - 41 U/L '16 17 18  ' ALT 0 - 44 U/L '13 13 14    ' Assessment and Plan 85 y.o. male present for management of prostate cancer and vitamin B12 deficiency. 1. Prostate cancer ([place])   2. Anemia due to vitamin B12 deficiency, unspecified B12 deficiency type   3. Encounter for antineoplastic chemotherapy   4. B12 deficiency   5. Androgen deprivation therapy   6. 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 Tolerating Zytiga 747m daily and prednisone 589mBID.  PSA has responded well and decreased to 2.09.  Continue current regimen.  Continue Omeprazole for GI prophylaxis.   # Continue Androgen deprivation therapy, Leupron 3046m 1  # Vitamin b12 deficiency, recent level 269, Continue monthly Vitamin B12 injections.   #Anemia of CKD, hemoglobin has decreased to 10.4, macrocytosis is worse.   #Thrombocytopenia,resolved.  #Protein calorie malnutrition, lost 2 pounds since 3 months ago. Continue follow up with dietitian.    Orders Placed This Encounter  Procedures  . CBC with Differential/Platelet    Standing Status:   Future    Standing Expiration Date:   02/10/2020  . Comprehensive metabolic panel    Standing Status:   Future    Standing Expiration Date:   02/10/2020  . PSA    Standing Status:    Future    Standing Expiration Date:   02/10/2020  . Vitamin B12    Standing Status:   Future    Standing Expiration Date:   04/12/2019    We spent sufficient time to discuss many aspect of care, questions were answered to patient's satisfaction.   ZhoEarlie ServerD, PhD  02/11/2019

## 2019-02-13 ENCOUNTER — Ambulatory Visit: Payer: Self-pay

## 2019-02-14 ENCOUNTER — Other Ambulatory Visit: Payer: Self-pay

## 2019-02-17 ENCOUNTER — Inpatient Hospital Stay: Payer: Medicare Other

## 2019-02-27 ENCOUNTER — Other Ambulatory Visit: Payer: Self-pay | Admitting: Oncology

## 2019-02-27 DIAGNOSIS — C61 Malignant neoplasm of prostate: Secondary | ICD-10-CM

## 2019-02-27 NOTE — Telephone Encounter (Signed)
CBC with Differential/Platelet Order: [id number]  Status: Final result Visible to patient: No (not released) Next appt: 03/12/2019 at 09:00 AM in Pulmonology Kendell Bane, NP) Dx: Prostate cancer [facility])    Ref Range & Units 2wk ago  62mo ago  92mo ago  58mo ago   WBC 4.0 - 10.5 K/uL 4.3  4.6  5.4  5.0   RBC 4.22 - 5.81 MIL/uL 3.03 3.31 3.32 3.30  Hemoglobin 13.0 - 17.0 g/dL 10.4 11.5 11.4 10.9  HCT 39.0 - 52.0 % 30.8 33.1 33.3 32.9  MCV 80.0 - 100.0 fL 101.7 100.0  100.3 99.7   MCH 26.0 - 34.0 pg 34.3 34.7 34.3 33.0   MCHC 30.0 - 36.0 g/dL 33.8  34.7  34.2  33.1   RDW 11.5 - 15.5 % 12.9  12.9  13.3  13.2   Platelets 150 - 400 K/uL 160  174  142 134  nRBC 0.0 - 0.2 % 0.0  0.0  0.0  0.0   Neutrophils Relative % % 68  73  76  75   Neutro Abs 1.7 - 7.7 K/uL 2.9  3.3  4.1  3.8   Lymphocytes Relative % 20  18  14  16    Lymphs Abs 0.7 - 4.0 K/uL 0.9  0.8  0.8  0.8   Monocytes Relative % 12  9  8  8    Monocytes Absolute 0.1 - 1.0 K/uL 0.5  0.4  0.5  0.4   Eosinophils Relative % 0  0  1  1   Eosinophils Absolute 0.0 - 0.5 K/uL 0.0  0.0  0.0  0.0   Basophils Relative % 0  0  1  0   Basophils Absolute 0.0 - 0.1 K/uL 0.0  0.0  0.0  0.0   Immature Granulocytes % 0  0  0  0   Abs Immature Granulocytes 0.00 - 0.07 K/uL 0.01  0.01 CM  0.02 CM  0.01 CM   Comment: Performed at [facility], [address] [place], [place] [address]  Resulting Agency  [facility] CLIN LAB [facility] CLIN LAB [facility] CLIN LAB [facility] CLIN LAB     Specimen Collected: 02/10/19 13:22 Last Resulted: 02/10/19 13:33  Lab Flowsheet  Order Details  View Encounter  Lab and Collection Details  Routing  Result History   CM=Additional comments    Other Results from 02/10/2019 Result Notes for Vitamin B12 Notes recorded by Vernetta Honey, CMA on 02/12/2019 at 11:47 AM EDT  Message sent to scheduling  ------   Notes recorded by Earlie Server, MD on 02/11/2019 at 8:58 PM EDT  Please arrange patient to get Vitamin B12 injection x 1 thanks.    Vitamin B12 Order: [id number]   Status: Final result Visible to patient: No (not released) Next appt: 03/12/2019 at 09:00 AM in Pulmonology Kendell Bane, NP) Dx: B12 deficiency    Ref Range & Units 2wk ago  47mo ago  85mo ago  40yr ago   Vitamin B-12 180 - 914 pg/mL 269  312 CM  361 CM  388 CM   Comment: (NOTE)  This assay is not validated for testing neonatal or  myeloproliferative syndrome specimens for Vitamin B12 levels.  Performed at [facility], [place] [address], [place], [place]  [address]    Resulting Agency  [facility] CLIN LAB [facility] CLIN LAB [facility] CLIN LAB [facility] CLIN LAB     Specimen Collected: 02/10/19 13:22 Last Resulted: 02/10/19 20:32  Lab Flowsheet  Order Details  View Encounter  Lab and Collection Details  Routing  Result History   CM=Additional comments       Result Notes for PSA Notes recorded by Vernetta Honey, CMA on 02/12/2019 at 11:47 AM EDT  Message sent to scheduling  ------   Notes recorded by Earlie Server, MD on 02/11/2019 at 8:58 PM EDT  Please arrange patient to get Vitamin B12 injection x 1 thanks.  PSA Order: [id number]   Status: Final result Visible to patient: No (not released) Next appt: 03/12/2019 at 09:00 AM in Pulmonology Kendell Bane, NP) Dx: Prostate cancer [facility])    Ref Range & Units 2wk ago  32mo ago  38mo ago  71mo ago   Prostatic Specific Antigen 0.00 - 4.00 ng/mL 2.09  3.15 CM  4.29CM  22.78CM   Comment: (NOTE)  While PSA levels of <=4.0 ng/ml are reported as reference range, some  men with levels below 4.0 ng/ml can have prostate cancer and many men  with PSA above 4.0 ng/ml do not have prostate cancer. Other tests  such as free PSA, age specific reference ranges, PSA velocity and PSA  doubling time may be helpful especially in men less than 52 years  old.  Performed at [facility], [place] [address], [place], [place]  [address]    Resulting Agency  [facility] CLIN LAB [facility] CLIN LAB [facility] CLIN LAB [facility] CLIN LAB     Specimen Collected:  02/10/19 13:22 Last Resulted: 02/10/19 20:32  Lab Flowsheet  Order Details  View Encounter  Lab and Collection Details  Routing  Result History   CM=Additional comments       Result Notes for Comprehensive metabolic panel Notes recorded by Vernetta Honey, CMA on 02/12/2019 at 11:47 AM EDT  Message sent to scheduling  ------   Notes recorded by Earlie Server, MD on 02/11/2019 at 8:58 PM EDT  Please arrange patient to get Vitamin B12 injection x 1 thanks.  Comprehensive metabolic panel Order: [id number]   Status: Final result Visible to patient: No (not released) Next appt: 03/12/2019 at 09:00 AM in Pulmonology Kendell Bane, NP) Dx: Prostate cancer [facility])    Ref Range & Units 2wk ago  34mo ago  26mo ago  36mo ago   Sodium 135 - 145 mmol/L 140  139  140  139   Potassium 3.5 - 5.1 mmol/L 4.1  4.1  3.8  3.9   Chloride 98 - 111 mmol/L 106  106  109  107   CO2 22 - 32 mmol/L 24  23  23  28    Glucose, Bld 70 - 99 mg/dL 118 118 101 102  BUN 8 - 23 mg/dL 32 25 26 26   Creatinine, Ser 0.61 - 1.24 mg/dL 1.68 1.64 1.58 1.49  Calcium 8.9 - 10.3 mg/dL 9.2  9.4  9.3  9.1   Total Protein 6.5 - 8.1 g/dL 6.7  6.9  6.9  7.1   Albumin 3.5 - 5.0 g/dL 3.8  4.0  4.0  4.0   AST 15 - 41 U/L 16  17  18  17    ALT 0 - 44 U/L 13  13  14  13    Alkaline Phosphatase 38 - 126 U/L 55  61  63  65   Total Bilirubin 0.3 - 1.2 mg/dL 1.0  1.3 1.2  0.9   GFR calc non Af Amer >60 mL/min 35 60 mL/min" class="[id number]"24 60 mL/min" class="rz_[email or web address]_8 [id number]"62 60 mL/min" class="rz_[email or web address]_8  [id number]"56  GFR calc Af Amer >60 mL/min 41 60 mL/min" class="rz_[email or web address]_8 [id number]"02 60 mL/min" class="rz_[email or web address]_8 [id number]"25 60 mL/min" class="rz_[email or web address]_8 [id number]"17  Anion gap 5 - 15 10  10  CM  8 CM  4CM   Comment: Performed at [facility], [address] [place], [place] [address]  Resulting Agency  [facility] CLIN LAB [facility] CLIN LAB [facility] CLIN LAB [facility] CLIN LAB     Specimen Collected: 02/10/19 13:22 Last Resulted: 02/10/19 13:44

## 2019-03-03 MED FILL — predniSONE 5 MG TABS: 5 | 30 days supply | Qty: 60 | Fill #1

## 2019-03-03 MED FILL — ABIRATERONE ACETATE 250 MG: 250 | 30 days supply | Qty: 90 | Fill #0

## 2019-03-12 ENCOUNTER — Ambulatory Visit: Payer: Medicare Other | Admitting: Adult Health

## 2019-03-13 ENCOUNTER — Inpatient Hospital Stay: Payer: Medicare Other

## 2019-03-13 ENCOUNTER — Other Ambulatory Visit: Payer: Self-pay | Admitting: Oncology

## 2019-03-13 ENCOUNTER — Other Ambulatory Visit: Payer: Self-pay

## 2019-03-13 ENCOUNTER — Inpatient Hospital Stay (HOSPITAL_BASED_OUTPATIENT_CLINIC_OR_DEPARTMENT_OTHER): Payer: Medicare Other | Admitting: Oncology

## 2019-03-13 ENCOUNTER — Encounter: Payer: Self-pay | Admitting: Oncology

## 2019-03-13 ENCOUNTER — Inpatient Hospital Stay: Payer: Medicare Other | Attending: Oncology

## 2019-03-13 VITALS — BP 88/61 | HR 93 | Temp 98.5°F | Resp 16 | Wt 137.6 lb

## 2019-03-13 VITALS — BP 134/78 | HR 79 | Resp 17

## 2019-03-13 DIAGNOSIS — D631 Anemia in chronic kidney disease: Secondary | ICD-10-CM

## 2019-03-13 DIAGNOSIS — C61 Malignant neoplasm of prostate: Secondary | ICD-10-CM | POA: Insufficient documentation

## 2019-03-13 DIAGNOSIS — Z5111 Encounter for antineoplastic chemotherapy: Secondary | ICD-10-CM

## 2019-03-13 DIAGNOSIS — D519 Vitamin B12 deficiency anemia, unspecified: Secondary | ICD-10-CM

## 2019-03-13 DIAGNOSIS — E538 Deficiency of other specified B group vitamins: Secondary | ICD-10-CM

## 2019-03-13 DIAGNOSIS — Z79818 Long term (current) use of other agents affecting estrogen receptors and estrogen levels: Secondary | ICD-10-CM

## 2019-03-13 DIAGNOSIS — N189 Chronic kidney disease, unspecified: Secondary | ICD-10-CM

## 2019-03-13 DIAGNOSIS — Z87891 Personal history of nicotine dependence: Secondary | ICD-10-CM

## 2019-03-13 DIAGNOSIS — I951 Orthostatic hypotension: Secondary | ICD-10-CM

## 2019-03-13 DIAGNOSIS — E860 Dehydration: Secondary | ICD-10-CM | POA: Insufficient documentation

## 2019-03-13 LAB — COMPREHENSIVE METABOLIC PANEL WITH GFR
ALT: 13 U/L (ref 0–44)
AST: 15 U/L (ref 15–41)
Albumin: 3.9 g/dL (ref 3.5–5.0)
Alkaline Phosphatase: 62 U/L (ref 38–126)
Anion gap: 8 (ref 5–15)
BUN: 31 mg/dL — ABNORMAL HIGH (ref 8–23)
CO2: 23 mmol/L (ref 22–32)
Calcium: 9.3 mg/dL (ref 8.9–10.3)
Chloride: 108 mmol/L (ref 98–111)
Creatinine, Ser: 1.52 mg/dL — ABNORMAL HIGH (ref 0.61–1.24)
GFR calc Af Amer: 46 mL/min — ABNORMAL LOW
GFR calc non Af Amer: 40 mL/min — ABNORMAL LOW
Glucose, Bld: 109 mg/dL — ABNORMAL HIGH (ref 70–99)
Potassium: 3.6 mmol/L (ref 3.5–5.1)
Sodium: 139 mmol/L (ref 135–145)
Total Bilirubin: 1.1 mg/dL (ref 0.3–1.2)
Total Protein: 6.7 g/dL (ref 6.5–8.1)

## 2019-03-13 LAB — PSA: Prostatic Specific Antigen: 1.62 ng/mL (ref 0.00–4.00)

## 2019-03-13 LAB — CBC WITH DIFFERENTIAL/PLATELET
Abs Immature Granulocytes: 0.01 10*3/uL (ref 0.00–0.07)
Basophils Absolute: 0 10*3/uL (ref 0.0–0.1)
Basophils Relative: 1 %
Eosinophils Absolute: 0 10*3/uL (ref 0.0–0.5)
Eosinophils Relative: 0 %
HCT: 32.5 % — ABNORMAL LOW (ref 39.0–52.0)
Hemoglobin: 11 g/dL — ABNORMAL LOW (ref 13.0–17.0)
Immature Granulocytes: 0 %
Lymphocytes Relative: 21 %
Lymphs Abs: 1 10*3/uL (ref 0.7–4.0)
MCH: 34.2 pg — ABNORMAL HIGH (ref 26.0–34.0)
MCHC: 33.8 g/dL (ref 30.0–36.0)
MCV: 100.9 fL — ABNORMAL HIGH (ref 80.0–100.0)
Monocytes Absolute: 0.5 10*3/uL (ref 0.1–1.0)
Monocytes Relative: 11 %
Neutro Abs: 3 10*3/uL (ref 1.7–7.7)
Neutrophils Relative %: 67 %
Platelets: 165 10*3/uL (ref 150–400)
RBC: 3.22 MIL/uL — ABNORMAL LOW (ref 4.22–5.81)
RDW: 12.5 % (ref 11.5–15.5)
WBC: 4.6 10*3/uL (ref 4.0–10.5)
nRBC: 0 % (ref 0.0–0.2)

## 2019-03-13 LAB — FERRITIN: Ferritin: 211 ng/mL (ref 24–336)

## 2019-03-13 LAB — VITAMIN B12: Vitamin B-12: 211 pg/mL (ref 180–914)

## 2019-03-13 LAB — IRON AND TIBC
Iron: 142 ug/dL (ref 45–182)
Saturation Ratios: 42 % — ABNORMAL HIGH (ref 17.9–39.5)
TIBC: 338 ug/dL (ref 250–450)
UIBC: 196 ug/dL

## 2019-03-13 MED ORDER — CYANOCOBALAMIN 1000 MCG/ML IJ SOLN
1000.0000 ug | Freq: Once | INTRAMUSCULAR | Status: AC
  Administered 2019-03-13: 1000 ug via INTRAMUSCULAR
  Filled 2019-03-13: qty 1

## 2019-03-13 MED ORDER — SODIUM CHLORIDE 0.9 % IV SOLN
Freq: Once | INTRAVENOUS | Status: AC
  Administered 2019-03-13: 11:00:00 via INTRAVENOUS
  Filled 2019-03-13: qty 250

## 2019-03-13 NOTE — Progress Notes (Signed)
Patient does not offer any problems today.   Blood pressure is low today at 88/61and reports he does feel dizzy when standing from sitting.  BP 118/78 sitting and 84/56 with standing

## 2019-03-13 NOTE — Progress Notes (Signed)
[facility] day:  03/13/2019    Chief Complaint: Charles Flores is an 85 y.o. male with prostate cancer and B12 deficiency who is seen for  3 month assessment.  Pertinent oncology history Patient previously follows up with Dr. Mike Gip.  He switched care to me on 09/05/2018. The patient is a 85 year old African American gentleman with prostate cancer and a rising PSA on Lupron.  Prostate cancer was discovered approximately 20 years secondary to an elevated PSA.  He underwent biopsy at [facility] (no report available). Initial PSA was 774.   I have personally reviewed the radiological images as listed and agreed with the findings in the report. Abdomen and pelvic CT on 01/05/2006 revealed an enlarged and heterogeneous prostate with mass effect on the base of the bladder.  There was no adenopathy.  Chest, abdomen, and pelvic CT on 03/15/2016 revealed moderate centrilobular emphysema.  There were mild lucent mottled appearance of the osseous structures (cannot exclude multiple myeloma).  There were scattered pulmonary nodules (4 mm or less in size).  There was a 2.7 cm low-attenuation left thyroid nodule.    Abdomen and pelvic CT on 12/07/2016 revealed no new or progressive findings.  There was no evidence of lymphadenopathy in the abdomen or pelvis. There was a stable appearance of heterogeneous/mottled bony mineralization.  Abdomen and pelvic CT on 11/27/2017 was stable with no evidence of adenopathy within the abdomen and pelvis.  There were no suspicious bone lesions.  There was a stable 3 mm RLL nodule.  Work-up on 03/17/2016 revealed no monoclonal protein.  SPEP revealed no monoclonal protein.  Kappa free light chains were 28.9, lambda free light chains 20.7 with a ratio of 1.40 (normal).  Immunoglobulins were normal (IgG 1391, IgA 282, IgM 29).  24 hour urine revealed no monoclonal protein. Thyroid ultrasound on 03/22/2016 revealed a  solitary 2.9 cm left lower pole nodule.  Thyroid biopsy on 04/14/2016 was benign (Bethesda category II) with abundant foamy macrophages and colloid. The cytologic findings were compatible with a cystic colloid nodule  Bone scan on 04/08/2014, 12/29/2014, 02/23/2016, 12/07/2016, and 11/27/2017 revealed no evidence of metastatic disease.  Patient has been on Lupron and Casodex.  He receives Lupron 30 mg every 4 months (last 12/06/2017).    PSA has been followed: 127.6 on 06/30/2014, 168.3 on 12/02/2014, 63.32 on 02/01/2015, 40.31 on 03/03/2015, 29.12 on 04/08/2015, 11.28 on 07/14/2015, 11.02 on 08/17/2015, 10.35 on 11/15/2015, 14.37 on 02/15/2016, 12.46 on 06/06/2016, 13.5 on 06/23/2016, 16.63 on 09/05/2016, 20.77 on 11/07/2016, 16.41 on 12/11/2016, 26.69 on 02/27/2017, 25.67 on 04/02/2017, 19.16 on 05/10/2017, 18.94 on 07/02/2017, 28.28 on 10/01/2017, 30.23 on 12/06/2017, and 34.56 on 03/14/2018.  He has a normocytic anemia. He was diagnosed with B12 deficiency on 12/24/2014. B12 was 169 (low).  He began B12 supplimentation on 02/01/2015 (last 11/15/2017).  Parietal cell antibody was positive on 07/02/2017.  Folate was 11 on 12/24/2014 and 12 on 06/06/2016.  Normal studies included a CMP, ferritin, iron studies, folate, and TSH. TSH and free T4 were normal on 04/02/2017.   # Limited abdominal ultrasound was done on 03/14/2018 revealed a 21.8 x 8.3 x 10.2 cm fatty mass with internal calcifications.  Findings felt to correspond to the CT findings on 11/2017, and not significantly changed from 12/2015 scan.  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  Neoplasm felt to be unlikely.  INTERVAL HISTORY Charles Flores is a 85 y.o.  male who has above history reviewed by me today presents for follow up visit for management of prostate cancer. Patient has been on Zytiga 750 mg daily as well as prednisone 5 mg twice daily. He reports some dizziness when he stands. Admit not  drinking much fluid lately.  Usually 1 or 2 glasses of water per day. Otherwise no new complaints.  Denies any back pain     Past Medical History:  Diagnosis Date  . Anemia 12/24/2014  . Arthritis   . B12 deficiency anemia 01/01/2015  . Cataract   . Coronary artery disease   . GERD (gastroesophageal reflux disease)   . Hyperlipidemia   . Hyperlipidemia   . Prostate cancer ([place])   . Renal insufficiency 04/15/2017  . Thyroid nodule 03/14/2016    Past Surgical History:  Procedure Laterality Date  . PROSTATE BIOPSY      Family History  Problem Relation Age of Onset  . Diabetes Sister   . Stroke Sister   . Gastric cancer Sister   . Cancer Mother   . Breast cancer Mother   . Kidney disease Father    Social History   Socioeconomic History  . Marital status: Single    Spouse name: Not on file  . Number of children: Not on file  . Years of education: Not on file  . Highest education level: Not on file  Occupational History  . Not on file  Social Needs  . Financial resource strain: Not on file  . Food insecurity    Worry: Not on file    Inability: Not on file  . Transportation needs    Medical: Not on file    Non-medical: Not on file  Tobacco Use  . Smoking status: Former Smoker    Packs/day: 0.50    Years: 15.00    Pack years: 7.50    Types: Cigarettes  . Smokeless tobacco: Never Used  . Tobacco comment: quit 40 years  Substance and Sexual Activity  . Alcohol use: No    Alcohol/week: 0.0 standard drinks  . Drug use: No  . Sexual activity: Yes    Partners: Male  Lifestyle  . Physical activity    Days per week: Not on file    Minutes per session: Not on file  . Stress: Not on file  Relationships  . Social Herbalist on phone: Not on file    Gets together: Not on file    Attends religious service: Not on file    Active member of club or organization: Not on file    Attends meetings of clubs or organizations: Not on file    Relationship status:  Not on file  . Intimate partner violence    Fear of current or ex partner: Not on file    Emotionally abused: Not on file    Physically abused: Not on file    Forced sexual activity: Not on file  Other Topics Concern  . Not on file  Social History Narrative  . Not on file     Allergies: No Known Allergies  Current Outpatient Medications  Medication Sig Dispense Refill  . abiraterone acetate (ZYTIGA) 250 MG tablet TAKE 3 TABLETS BY MOUTH DAILY. TAKE ON AN EMPTY STOMACH 1 HOUR BEFORE OR 2 HOURS AFTER A MEAL 90 tablet 0  . midodrine (PROAMATINE) 2.5 MG tablet Take 1 tablet (2.5 mg total) by mouth 3 (three) times daily with meals. 270 tablet 2  . predniSONE (DELTASONE) 5  MG tablet TAKE 1 TABLET (5 MG TOTAL) BY MOUTH 2 (TWO) TIMES DAILY WITH A MEAL. 60 tablet 3  . omeprazole (PRILOSEC) 20 MG capsule TAKE 1 CAPSULE BY MOUTH EVERY DAY 90 capsule 0   No current facility-administered medications for this visit.    Review of Systems  Constitutional: Positive for fatigue. Negative for appetite change, chills, fever and unexpected weight change.  HENT:   Negative for hearing loss and voice change.   Eyes: Negative for eye problems and icterus.  Respiratory: Negative for chest tightness, cough and shortness of breath.   Cardiovascular: Negative for chest pain and leg swelling.  Gastrointestinal: Negative for abdominal distention and abdominal pain.  Endocrine: Negative for hot flashes.  Genitourinary: Negative for difficulty urinating, dysuria and frequency.   Musculoskeletal: Negative for arthralgias.  Skin: Negative for itching and rash.  Neurological: Positive for light-headedness. Negative for numbness.  Hematological: Negative for adenopathy. Does not bruise/bleed easily.  Psychiatric/Behavioral: Negative for confusion.    Performance status (ECOG): 1 - Symptomatic but completely ambulatory  Vital Signs BP (!) 88/61   Pulse 93   Temp 98.5 F (36.9 C)   Resp 16   Wt 137 lb 9.6  oz (62.4 kg)   BMI 19.19 kg/m   Physical Exam  Constitutional: He is oriented to person, place, and time. He appears healthy. No distress.  Thin, frail appearance  HENT:  Head: Normocephalic and atraumatic.  Nose: Nose normal.  Mouth/Throat: Oropharynx is clear and moist and mucous membranes are normal. He has dentures. No oropharyngeal exudate.  Eyes: Pupils are equal, round, and reactive to light. EOM are normal. No scleral icterus.  Neck: Normal range of motion. Neck supple. No tracheal deviation present. No thyromegaly present.  Cardiovascular: Normal rate, regular rhythm, normal heart sounds and intact distal pulses. Exam reveals no gallop and no friction rub.  No murmur heard. Pulmonary/Chest: Effort normal and breath sounds normal. No respiratory distress. He has no wheezes.  Abdominal: Soft. Bowel sounds are normal. He exhibits no distension. Mass: Large right flank mass: Previously ultrasound consistent with a lipomatous growth fat necrosis and resultant calcification. There is no abdominal tenderness.  Musculoskeletal: Normal range of motion.        General: No tenderness or edema.  Lymphadenopathy:    He has no cervical adenopathy.    He has no axillary adenopathy.       Right: No supraclavicular adenopathy present.       Left: No supraclavicular adenopathy present.  Neurological: He is alert and oriented to person, place, and time.  Skin: Skin is warm and dry. No rash noted. He is not diaphoretic. No erythema.  Psychiatric: Mood and affect normal.  Nursing note and vitals reviewed.  CBC    Component Value Date/Time   WBC 4.6 03/13/2019 1010   RBC 3.22 (L) 03/13/2019 1010   HGB 11.0 (L) 03/13/2019 1010   HCT 32.5 (L) 03/13/2019 1010   PLT 165 03/13/2019 1010   MCV 100.9 (H) 03/13/2019 1010   MCH 34.2 (H) 03/13/2019 1010   MCHC 33.8 03/13/2019 1010   RDW 12.5 03/13/2019 1010   LYMPHSABS 1.0 03/13/2019 1010   MONOABS 0.5 03/13/2019 1010   EOSABS 0.0 03/13/2019 1010    BASOSABS 0.0 03/13/2019 1010   CMP Latest Ref Rng & Units 03/13/2019 02/10/2019 01/08/2019  Glucose 70 - 99 mg/dL 109(H) 118(H) 118(H)  BUN 8 - 23 mg/dL 31(H) 32(H) 25(H)  Creatinine 0.61 - 1.24 mg/dL 1.52(H) 1.68(H) 1.64(H)  Sodium  135 - 145 mmol/L 139 140 139  Potassium 3.5 - 5.1 mmol/L 3.6 4.1 4.1  Chloride 98 - 111 mmol/L 108 106 106  CO2 22 - 32 mmol/L _0 Calcium 8.9 - 10.3 mg/dL 9.3 9.2 9.4  Total Protein 6.5 - 8.1 g/dL 6.7 6.7 6.9  Total Bilirubin 0.3 - 1.2 mg/dL 1.1 1.0 1.3(H)  Alkaline Phos 38 - 126 U/L 62 55 61  AST 15 - 41 U/L _1 ALT 0 - 44 U/L _2 Assessment and Plan 85 y.o. male present for management of prostate cancer and vitamin B12 deficiency. 1. Prostate cancer ([place])   2. B12 deficiency   3. Encounter for antineoplastic chemotherapy   4. Androgen deprivation therapy   5. 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 Patient tolerates Zytiga 750 mg daily and prednisone 5 mg twice daily. PSA responded well and decreased to 1.62. Continue current regimen. Continue omeprazole for GI prophylaxis due to long-term prednisone use.  #Chronic hypotension, on midodrine. He may also has a component of dehydration due to poor fluid intake. Orthostatic hypotension today in the clinic. We will proceed with 500 cc of IV fluid infusion over an hour.   Tolerating Zytiga 7106m daily and prednisone 574mBID.  PSA has responded well and decreased to 2.09.  Continue current regimen.  Continue Omeprazole for GI prophylaxis.   # Androgen deprivation therapy, Leupron 3044mas given on 01/21/2019.  Due in 10/ 2020.  # Vitamin b12 deficiency, recent level 269, vitamin B12 injection was interrupted for a few months due to call with pandemic.  We will resume vitamin B12 monthly.   #Anemia of CKD, hemoglobin is stable at 11..  Marland Kitchen#Protein calorie malnutrition, lost 1 pound since  1 month. Continue  follow up with dietitian.    Orders Placed This Encounter  Procedures  . CBC with Differential    Standing Status:   Future    Standing Expiration Date:   03/12/2020  . Comprehensive metabolic panel    Standing Status:   Future    Standing Expiration Date:   03/12/2020    We spent sufficient time to discuss many aspect of care, questions were answered to patient's satisfaction. Follow-up in 1 week for reassessment.  ZhoEarlie ServerD, PhD 03/13/2019

## 2019-03-17 ENCOUNTER — Ambulatory Visit (INDEPENDENT_AMBULATORY_CARE_PROVIDER_SITE_OTHER): Payer: Medicare Other | Admitting: Adult Health

## 2019-03-17 ENCOUNTER — Other Ambulatory Visit: Payer: Self-pay

## 2019-03-17 ENCOUNTER — Encounter: Payer: Self-pay | Admitting: Adult Health

## 2019-03-17 VITALS — BP 106/63 | HR 92 | Resp 16 | Ht 71.0 in | Wt 138.0 lb

## 2019-03-17 DIAGNOSIS — K219 Gastro-esophageal reflux disease without esophagitis: Secondary | ICD-10-CM

## 2019-03-17 DIAGNOSIS — I950 Idiopathic hypotension: Secondary | ICD-10-CM

## 2019-03-17 DIAGNOSIS — R0602 Shortness of breath: Secondary | ICD-10-CM

## 2019-03-17 DIAGNOSIS — C61 Malignant neoplasm of prostate: Secondary | ICD-10-CM | POA: Diagnosis not present

## 2019-03-17 NOTE — Progress Notes (Signed)
[facility] [place], [place] [address]  Internal MEDICINE  Office Visit Note  Patient Name: Charles Flores  [id number]  [id number]  Date of Service: 03/17/2019  Chief Complaint  Patient presents with  . Medical Management of Chronic Issues    4 MONTH FOLLOW UP  . Hypothyroidism  . Hyperlipidemia  . Gastroesophageal Reflux    HPI Pt is here for follow up on GERD.  He reports overall he is doing well.  He recently saw Dr. Tasia Catchings for his prostate cancer follow up.  He states his hemoglobin was low, and he was given IV fluid in the office.  His hemoglobin on those labs was 11.0.  He denies any current issues at this time. His Jerrye Bushy is well controlled.    Current Medication: Outpatient Encounter Medications as of 03/17/2019  Medication Sig  . abiraterone acetate (ZYTIGA) 250 MG tablet TAKE 3 TABLETS BY MOUTH DAILY. TAKE ON AN EMPTY STOMACH 1 HOUR BEFORE OR 2 HOURS AFTER A MEAL  . midodrine (PROAMATINE) 2.5 MG tablet Take 1 tablet (2.5 mg total) by mouth 3 (three) times daily with meals.  Marland Kitchen omeprazole (PRILOSEC) 20 MG capsule TAKE 1 CAPSULE BY MOUTH EVERY DAY  . predniSONE (DELTASONE) 5 MG tablet TAKE 1 TABLET (5 MG TOTAL) BY MOUTH 2 (TWO) TIMES DAILY WITH A MEAL.   No facility-administered encounter medications on file as of 03/17/2019.     Surgical History: Past Surgical History:  Procedure Laterality Date  . PROSTATE BIOPSY      Medical History: Past Medical History:  Diagnosis Date  . Anemia 12/24/2014  . Arthritis   . B12 deficiency anemia 01/01/2015  . Cataract   . Coronary artery disease   . GERD (gastroesophageal reflux disease)   . Hyperlipidemia   . Hyperlipidemia   . Prostate cancer ([place])   . Renal insufficiency 04/15/2017  . Thyroid nodule 03/14/2016    Family History: Family History  Problem Relation Age of Onset  . Diabetes Sister   . Stroke Sister   . Gastric cancer Sister   . Cancer Mother   . Breast cancer Mother   . Kidney disease  Father     Social History   Socioeconomic History  . Marital status: Single    Spouse name: Not on file  . Number of children: Not on file  . Years of education: Not on file  . Highest education level: Not on file  Occupational History  . Not on file  Social Needs  . Financial resource strain: Not on file  . Food insecurity    Worry: Not on file    Inability: Not on file  . Transportation needs    Medical: Not on file    Non-medical: Not on file  Tobacco Use  . Smoking status: Former Smoker    Packs/day: 0.50    Years: 15.00    Pack years: 7.50    Types: Cigarettes  . Smokeless tobacco: Never Used  . Tobacco comment: quit 40 years  Substance and Sexual Activity  . Alcohol use: No    Alcohol/week: 0.0 standard drinks  . Drug use: No  . Sexual activity: Yes    Partners: Male  Lifestyle  . Physical activity    Days per week: Not on file    Minutes per session: Not on file  . Stress: Not on file  Relationships  . Social Herbalist on phone: Not on file    Gets together:  Not on file    Attends religious service: Not on file    Active member of club or organization: Not on file    Attends meetings of clubs or organizations: Not on file    Relationship status: Not on file  . Intimate partner violence    Fear of current or ex partner: Not on file    Emotionally abused: Not on file    Physically abused: Not on file    Forced sexual activity: Not on file  Other Topics Concern  . Not on file  Social History Narrative  . Not on file      Review of Systems  Constitutional: Negative.  Negative for chills, fatigue and unexpected weight change.  HENT: Negative.  Negative for congestion, rhinorrhea, sneezing and sore throat.   Eyes: Negative for redness.  Respiratory: Negative.  Negative for cough, chest tightness and shortness of breath.   Cardiovascular: Negative.  Negative for chest pain and palpitations.  Gastrointestinal: Negative.  Negative for  abdominal pain, constipation, diarrhea, nausea and vomiting.  Endocrine: Negative.   Genitourinary: Negative.  Negative for dysuria and frequency.  Musculoskeletal: Negative.  Negative for arthralgias, back pain, joint swelling and neck pain.  Skin: Negative.  Negative for rash.  Allergic/Immunologic: Negative.   Neurological: Negative.  Negative for tremors and numbness.  Hematological: Negative for adenopathy. Does not bruise/bleed easily.  Psychiatric/Behavioral: Negative.  Negative for behavioral problems, sleep disturbance and suicidal ideas. The patient is not nervous/anxious.     Vital Signs: BP 106/63   Pulse 92   Resp 16   Ht 5\' 11"  (1.803 m)   Wt 138 lb (62.6 kg)   SpO2 100%   BMI 19.25 kg/m    Physical Exam Vitals signs and nursing note reviewed.  Constitutional:      General: He is not in acute distress.    Appearance: He is well-developed. He is not diaphoretic.  HENT:     Head: Normocephalic and atraumatic.     Mouth/Throat:     Pharynx: No oropharyngeal exudate.  Eyes:     Pupils: Pupils are equal, round, and reactive to light.  Neck:     Musculoskeletal: Normal range of motion and neck supple.     Thyroid: No thyromegaly.     Vascular: No JVD.     Trachea: No tracheal deviation.  Cardiovascular:     Rate and Rhythm: Normal rate and regular rhythm.     Heart sounds: Normal heart sounds. No murmur. No friction rub. No gallop.   Pulmonary:     Effort: Pulmonary effort is normal. No respiratory distress.     Breath sounds: Normal breath sounds. No wheezing or rales.  Chest:     Chest wall: No tenderness.  Abdominal:     Palpations: Abdomen is soft.     Tenderness: There is no abdominal tenderness. There is no guarding.  Musculoskeletal: Normal range of motion.  Lymphadenopathy:     Cervical: No cervical adenopathy.  Skin:    General: Skin is warm and dry.  Neurological:     Mental Status: He is alert and oriented to person, place, and time.      Cranial Nerves: No cranial nerve deficit.  Psychiatric:        Behavior: Behavior normal.        Thought Content: Thought content normal.        Judgment: Judgment normal.    Assessment/Plan: 1. Gastroesophageal reflux disease without esophagitis Stable, continue present management.  2. Idiopathic hypotension Appears improved at this time.  Continue present therapy.   3. Prostate cancer ([place]) Continue to follow up with oncology as scheduled.   General Counseling: Charles Flores understanding of the findings of todays visit and agrees with plan of treatment. I have discussed any further diagnostic evaluation that may be needed or ordered today. We also reviewed his medications today. he has been encouraged to call the office with any questions or concerns that should arise related to todays visit.    No orders of the defined types were placed in this encounter.   No orders of the defined types were placed in this encounter.   Time spent: 15 Minutes   This patient was seen by Orson Gear AGNP-C in Collaboration with Dr Lavera Guise as a part of collaborative care agreement     Kendell Bane AGNP-C Internal medicine

## 2019-03-20 ENCOUNTER — Inpatient Hospital Stay: Payer: Medicare Other

## 2019-03-20 ENCOUNTER — Inpatient Hospital Stay (HOSPITAL_BASED_OUTPATIENT_CLINIC_OR_DEPARTMENT_OTHER): Payer: Medicare Other | Admitting: Oncology

## 2019-03-20 ENCOUNTER — Encounter: Payer: Self-pay | Admitting: Oncology

## 2019-03-20 ENCOUNTER — Other Ambulatory Visit: Payer: Self-pay

## 2019-03-20 VITALS — BP 147/77 | HR 69 | Temp 96.9°F | Resp 18 | Wt 137.2 lb

## 2019-03-20 DIAGNOSIS — E860 Dehydration: Secondary | ICD-10-CM | POA: Diagnosis not present

## 2019-03-20 DIAGNOSIS — I951 Orthostatic hypotension: Secondary | ICD-10-CM

## 2019-03-20 DIAGNOSIS — C61 Malignant neoplasm of prostate: Secondary | ICD-10-CM | POA: Diagnosis not present

## 2019-03-20 DIAGNOSIS — D631 Anemia in chronic kidney disease: Secondary | ICD-10-CM | POA: Diagnosis not present

## 2019-03-20 DIAGNOSIS — N189 Chronic kidney disease, unspecified: Secondary | ICD-10-CM | POA: Diagnosis not present

## 2019-03-20 DIAGNOSIS — E538 Deficiency of other specified B group vitamins: Secondary | ICD-10-CM | POA: Diagnosis not present

## 2019-03-20 DIAGNOSIS — Z87891 Personal history of nicotine dependence: Secondary | ICD-10-CM | POA: Diagnosis not present

## 2019-03-20 DIAGNOSIS — N183 Chronic kidney disease, stage 3 unspecified: Secondary | ICD-10-CM

## 2019-03-20 LAB — BASIC METABOLIC PANEL
Anion gap: 9 (ref 5–15)
BUN: 31 mg/dL — ABNORMAL HIGH (ref 8–23)
CO2: 23 mmol/L (ref 22–32)
Calcium: 9.7 mg/dL (ref 8.9–10.3)
Chloride: 108 mmol/L (ref 98–111)
Creatinine, Ser: 1.56 mg/dL — ABNORMAL HIGH (ref 0.61–1.24)
GFR calc Af Amer: 45 mL/min — ABNORMAL LOW (ref >60)
GFR calc non Af Amer: 39 mL/min — ABNORMAL LOW (ref >60)
Glucose, Bld: 92 mg/dL (ref 70–99)
Potassium: 3.9 mmol/L (ref 3.5–5.1)
Sodium: 140 mmol/L (ref 135–145)

## 2019-03-20 NOTE — Progress Notes (Signed)
Pt in for follow up, denies any concerns today. 

## 2019-03-20 NOTE — Progress Notes (Signed)
[facility] day:  03/20/2019    Chief Complaint: Charles Flores is an 85 y.o. male with prostate cancer and B12 deficiency who is seen for  3 month assessment.  Pertinent oncology history Patient previously follows up with Dr. Mike Gip.  He switched care to me on 09/05/2018. The patient is a 85 year old African American gentleman with prostate cancer and a rising PSA on Lupron.  Prostate cancer was discovered approximately 20 years secondary to an elevated PSA.  He underwent biopsy at [facility] (no report available). Initial PSA was 774.   I have personally reviewed the radiological images as listed and agreed with the findings in the report. Abdomen and pelvic CT on 01/05/2006 revealed an enlarged and heterogeneous prostate with mass effect on the base of the bladder.  There was no adenopathy.  Chest, abdomen, and pelvic CT on 03/15/2016 revealed moderate centrilobular emphysema.  There were mild lucent mottled appearance of the osseous structures (cannot exclude multiple myeloma).  There were scattered pulmonary nodules (4 mm or less in size).  There was a 2.7 cm low-attenuation left thyroid nodule.    Abdomen and pelvic CT on 12/07/2016 revealed no new or progressive findings.  There was no evidence of lymphadenopathy in the abdomen or pelvis. There was a stable appearance of heterogeneous/mottled bony mineralization.  Abdomen and pelvic CT on 11/27/2017 was stable with no evidence of adenopathy within the abdomen and pelvis.  There were no suspicious bone lesions.  There was a stable 3 mm RLL nodule.  Work-up on 03/17/2016 revealed no monoclonal protein.  SPEP revealed no monoclonal protein.  Kappa free light chains were 28.9, lambda free light chains 20.7 with a ratio of 1.40 (normal).  Immunoglobulins were normal (IgG 1391, IgA 282, IgM 29).  24 hour urine revealed no monoclonal protein. Thyroid ultrasound on 03/22/2016 revealed a  solitary 2.9 cm left lower pole nodule.  Thyroid biopsy on 04/14/2016 was benign (Bethesda category II) with abundant foamy macrophages and colloid. The cytologic findings were compatible with a cystic colloid nodule  Bone scan on 04/08/2014, 12/29/2014, 02/23/2016, 12/07/2016, and 11/27/2017 revealed no evidence of metastatic disease.  Patient has been on Lupron and Casodex.  He receives Lupron 30 mg every 4 months (last 12/06/2017).    PSA has been followed: 127.6 on 06/30/2014, 168.3 on 12/02/2014, 63.32 on 02/01/2015, 40.31 on 03/03/2015, 29.12 on 04/08/2015, 11.28 on 07/14/2015, 11.02 on 08/17/2015, 10.35 on 11/15/2015, 14.37 on 02/15/2016, 12.46 on 06/06/2016, 13.5 on 06/23/2016, 16.63 on 09/05/2016, 20.77 on 11/07/2016, 16.41 on 12/11/2016, 26.69 on 02/27/2017, 25.67 on 04/02/2017, 19.16 on 05/10/2017, 18.94 on 07/02/2017, 28.28 on 10/01/2017, 30.23 on 12/06/2017, and 34.56 on 03/14/2018.  He has a normocytic anemia. He was diagnosed with B12 deficiency on 12/24/2014. B12 was 169 (low).  He began B12 supplimentation on 02/01/2015 (last 11/15/2017).  Parietal cell antibody was positive on 07/02/2017.  Folate was 11 on 12/24/2014 and 12 on 06/06/2016.  Normal studies included a CMP, ferritin, iron studies, folate, and TSH. TSH and free T4 were normal on 04/02/2017.   # Limited abdominal ultrasound was done on 03/14/2018 revealed a 21.8 x 8.3 x 10.2 cm fatty mass with internal calcifications.  Findings felt to correspond to the CT findings on 11/2017, and not significantly changed from 12/2015 scan.  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  Neoplasm felt to be unlikely.  INTERVAL HISTORY Charles Flores is a 85 y.o.  male who has above history reviewed by me today presents for follow up visit for management of prostate cancer. Patient has been on Zytiga 750 mg daily as well as prednisone 5 mg twice daily. Patient was feeling dizzy during last visit, orthostatic  hypotension. Patient has been on midodrine. Patient was given IV fluid and he reports feeling a lot better.  This is a follow-up for reassessment. He has no new complaints.   Past Medical History:  Diagnosis Date  . Anemia 12/24/2014  . Arthritis   . B12 deficiency anemia 01/01/2015  . Cataract   . Coronary artery disease   . GERD (gastroesophageal reflux disease)   . Hyperlipidemia   . Hyperlipidemia   . Prostate cancer ([place])   . Renal insufficiency 04/15/2017  . Thyroid nodule 03/14/2016    Past Surgical History:  Procedure Laterality Date  . PROSTATE BIOPSY      Family History  Problem Relation Age of Onset  . Diabetes Sister   . Stroke Sister   . Gastric cancer Sister   . Cancer Mother   . Breast cancer Mother   . Kidney disease Father    Social History   Socioeconomic History  . Marital status: Single    Spouse name: Not on file  . Number of children: Not on file  . Years of education: Not on file  . Highest education level: Not on file  Occupational History  . Not on file  Social Needs  . Financial resource strain: Not on file  . Food insecurity    Worry: Not on file    Inability: Not on file  . Transportation needs    Medical: Not on file    Non-medical: Not on file  Tobacco Use  . Smoking status: Former Smoker    Packs/day: 0.50    Years: 15.00    Pack years: 7.50    Types: Cigarettes  . Smokeless tobacco: Never Used  . Tobacco comment: quit 40 years  Substance and Sexual Activity  . Alcohol use: No    Alcohol/week: 0.0 standard drinks  . Drug use: No  . Sexual activity: Yes    Partners: Male  Lifestyle  . Physical activity    Days per week: Not on file    Minutes per session: Not on file  . Stress: Not on file  Relationships  . Social Herbalist on phone: Not on file    Gets together: Not on file    Attends religious service: Not on file    Active member of club or organization: Not on file    Attends meetings of clubs or  organizations: Not on file    Relationship status: Not on file  . Intimate partner violence    Fear of current or ex partner: Not on file    Emotionally abused: Not on file    Physically abused: Not on file    Forced sexual activity: Not on file  Other Topics Concern  . Not on file  Social History Narrative  . Not on file     Allergies: No Known Allergies  Current Outpatient Medications  Medication Sig Dispense Refill  . abiraterone acetate (ZYTIGA) 250 MG tablet TAKE 3 TABLETS BY MOUTH DAILY. TAKE ON AN EMPTY STOMACH 1 HOUR BEFORE OR 2 HOURS AFTER A MEAL 90 tablet 0  . midodrine (PROAMATINE) 2.5 MG tablet Take 1 tablet (2.5 mg total) by mouth 3 (three) times daily with meals. 270 tablet 2  .  omeprazole (PRILOSEC) 20 MG capsule TAKE 1 CAPSULE BY MOUTH EVERY DAY 90 capsule 0  . predniSONE (DELTASONE) 5 MG tablet TAKE 1 TABLET (5 MG TOTAL) BY MOUTH 2 (TWO) TIMES DAILY WITH A MEAL. 60 tablet 3   No current facility-administered medications for this visit.    Review of Systems  Constitutional: Positive for fatigue. Negative for appetite change, chills, fever and unexpected weight change.  HENT:   Negative for hearing loss and voice change.   Eyes: Negative for eye problems and icterus.  Respiratory: Negative for chest tightness, cough and shortness of breath.   Cardiovascular: Negative for chest pain and leg swelling.  Gastrointestinal: Negative for abdominal distention and abdominal pain.  Endocrine: Negative for hot flashes.  Genitourinary: Negative for difficulty urinating, dysuria and frequency.   Musculoskeletal: Negative for arthralgias.  Skin: Negative for itching and rash.  Neurological: Positive for light-headedness. Negative for numbness.  Hematological: Negative for adenopathy. Does not bruise/bleed easily.  Psychiatric/Behavioral: Negative for confusion.    Performance status (ECOG): 1 - Symptomatic but completely ambulatory  Vital Signs BP (!) 147/77 (BP Location:  Left Arm, Patient Position: Sitting)   Pulse 69   Temp (!) 96.9 F (36.1 C) (Tympanic)   Resp 18   Wt 137 lb 3.2 oz (62.2 kg)   SpO2 99%   BMI 19.14 kg/m   Physical Exam  Constitutional: He is oriented to person, place, and time. He appears healthy. No distress.  Thin, frail appearance  HENT:  Head: Normocephalic and atraumatic.  Nose: Nose normal.  Mouth/Throat: Oropharynx is clear and moist and mucous membranes are normal. He has dentures. No oropharyngeal exudate.  Eyes: Pupils are equal, round, and reactive to light. EOM are normal. No scleral icterus.  Neck: Normal range of motion. Neck supple. No tracheal deviation present. No thyromegaly present.  Cardiovascular: Normal rate, regular rhythm, normal heart sounds and intact distal pulses. Exam reveals no gallop and no friction rub.  No murmur heard. Pulmonary/Chest: Effort normal and breath sounds normal. No respiratory distress. He has no wheezes. He has no rales. He exhibits no tenderness.  Abdominal: Soft. Bowel sounds are normal. He exhibits no distension. Mass: Large right flank mass: Previously ultrasound consistent with a lipomatous growth fat necrosis and resultant calcification. There is no abdominal tenderness.  Musculoskeletal: Normal range of motion.        General: No tenderness or edema.  Lymphadenopathy:    He has no cervical adenopathy.    He has no axillary adenopathy.       Right: No supraclavicular adenopathy present.       Left: No supraclavicular adenopathy present.  Neurological: He is alert and oriented to person, place, and time. No cranial nerve deficit. He exhibits normal muscle tone. Coordination normal.  Skin: Skin is warm and dry. No rash noted. He is not diaphoretic. No erythema.  Psychiatric: Mood and affect normal.  Nursing note and vitals reviewed.  CBC    Component Value Date/Time   WBC 4.6 03/13/2019 1010   RBC 3.22 (L) 03/13/2019 1010   HGB 11.0 (L) 03/13/2019 1010   HCT 32.5 (L)  03/13/2019 1010   PLT 165 03/13/2019 1010   MCV 100.9 (H) 03/13/2019 1010   MCH 34.2 (H) 03/13/2019 1010   MCHC 33.8 03/13/2019 1010   RDW 12.5 03/13/2019 1010   LYMPHSABS 1.0 03/13/2019 1010   MONOABS 0.5 03/13/2019 1010   EOSABS 0.0 03/13/2019 1010   BASOSABS 0.0 03/13/2019 1010   CMP Latest Ref  Rng & Units 03/20/2019 03/13/2019 02/10/2019  Glucose 70 - 99 mg/dL 92 109(H) 118(H)  BUN 8 - 23 mg/dL 31(H) 31(H) 32(H)  Creatinine 0.61 - 1.24 mg/dL 1.56(H) 1.52(H) 1.68(H)  Sodium 135 - 145 mmol/L 140 139 140  Potassium 3.5 - 5.1 mmol/L 3.9 3.6 4.1  Chloride 98 - 111 mmol/L 108 108 106  CO2 22 - 32 mmol/L '23 23 24  ' Calcium 8.9 - 10.3 mg/dL 9.7 9.3 9.2  Total Protein 6.5 - 8.1 g/dL - 6.7 6.7  Total Bilirubin 0.3 - 1.2 mg/dL - 1.1 1.0  Alkaline Phos 38 - 126 U/L - 62 55  AST 15 - 41 U/L - 15 16  ALT 0 - 44 U/L - 13 13    Assessment and Plan 85 y.o. male present for management of prostate cancer and vitamin B12 deficiency. 1. Prostate cancer ([place])   2. Anemia due to stage 3 chronic kidney disease ([place])   3. Orthostatic hypotension    #Hypotension, resolved.  Most likely secondary to dehydration.  Advised patient to increase oral fluid intake.  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 Patient tolerates Zytiga 750 mg daily and prednisone 5 mg twice daily. PSA responded well and decreased to 1.62. Continue current regimen. Continue omeprazole for GI prophylaxis due to long-term prednisone use.  # Androgen deprivation therapy, Leupron 22m was given on 01/21/2019.  Due in 10/ 2020.  # Vitamin b12 deficiency, on B12 injection monthly.  #Anemia of CKD, hemoglobin is stable at 11..Marland Kitchen  #Protein calorie malnutrition, continue follow-up with dietitian.  Follow-up in 4 weeks. Orders Placed This Encounter  Procedures  . PSA    Standing Status:   Future    Standing Expiration Date:   03/19/2020    We spent sufficient time to discuss many aspect of  care, questions were answered to patient's satisfaction. Follow-up in 1 week for reassessment.  ZEarlie Server MD, PhD 03/20/2019

## 2019-03-28 ENCOUNTER — Other Ambulatory Visit: Payer: Self-pay | Admitting: Oncology

## 2019-03-28 DIAGNOSIS — C61 Malignant neoplasm of prostate: Secondary | ICD-10-CM

## 2019-04-01 MED FILL — predniSONE 5 MG TABS: 5 | 30 days supply | Qty: 60 | Fill #2

## 2019-04-01 MED FILL — ABIRATERONE ACETATE 250 MG: 250 | 30 days supply | Qty: 90 | Fill #0

## 2019-04-17 ENCOUNTER — Inpatient Hospital Stay: Payer: Medicare Other

## 2019-04-17 ENCOUNTER — Encounter: Payer: Self-pay | Admitting: Oncology

## 2019-04-17 ENCOUNTER — Other Ambulatory Visit: Payer: Self-pay

## 2019-04-17 ENCOUNTER — Inpatient Hospital Stay: Payer: Medicare Other | Attending: Oncology

## 2019-04-17 ENCOUNTER — Inpatient Hospital Stay (HOSPITAL_BASED_OUTPATIENT_CLINIC_OR_DEPARTMENT_OTHER): Payer: Medicare Other | Admitting: Oncology

## 2019-04-17 VITALS — BP 145/86 | HR 75 | Temp 97.1°F | Resp 18 | Wt 145.1 lb

## 2019-04-17 DIAGNOSIS — C61 Malignant neoplasm of prostate: Secondary | ICD-10-CM | POA: Diagnosis present

## 2019-04-17 DIAGNOSIS — Z5111 Encounter for antineoplastic chemotherapy: Secondary | ICD-10-CM

## 2019-04-17 DIAGNOSIS — N183 Chronic kidney disease, stage 3 unspecified: Secondary | ICD-10-CM

## 2019-04-17 DIAGNOSIS — I951 Orthostatic hypotension: Secondary | ICD-10-CM

## 2019-04-17 DIAGNOSIS — N189 Chronic kidney disease, unspecified: Secondary | ICD-10-CM | POA: Insufficient documentation

## 2019-04-17 DIAGNOSIS — R918 Other nonspecific abnormal finding of lung field: Secondary | ICD-10-CM | POA: Insufficient documentation

## 2019-04-17 DIAGNOSIS — E538 Deficiency of other specified B group vitamins: Secondary | ICD-10-CM | POA: Insufficient documentation

## 2019-04-17 DIAGNOSIS — D519 Vitamin B12 deficiency anemia, unspecified: Secondary | ICD-10-CM

## 2019-04-17 DIAGNOSIS — D631 Anemia in chronic kidney disease: Secondary | ICD-10-CM | POA: Insufficient documentation

## 2019-04-17 LAB — CBC WITH DIFFERENTIAL/PLATELET
Abs Immature Granulocytes: 0.01 10*3/uL (ref 0.00–0.07)
Basophils Absolute: 0 10*3/uL (ref 0.0–0.1)
Basophils Relative: 1 %
Eosinophils Absolute: 0 10*3/uL (ref 0.0–0.5)
Eosinophils Relative: 0 %
HCT: 33 % — ABNORMAL LOW (ref 39.0–52.0)
Hemoglobin: 11.3 g/dL — ABNORMAL LOW (ref 13.0–17.0)
Immature Granulocytes: 0 %
Lymphocytes Relative: 24 %
Lymphs Abs: 1.1 10*3/uL (ref 0.7–4.0)
MCH: 35 pg — ABNORMAL HIGH (ref 26.0–34.0)
MCHC: 34.2 g/dL (ref 30.0–36.0)
MCV: 102.2 fL — ABNORMAL HIGH (ref 80.0–100.0)
Monocytes Absolute: 0.5 10*3/uL (ref 0.1–1.0)
Monocytes Relative: 11 %
Neutro Abs: 2.8 10*3/uL (ref 1.7–7.7)
Neutrophils Relative %: 64 %
Platelets: 178 10*3/uL (ref 150–400)
RBC: 3.23 MIL/uL — ABNORMAL LOW (ref 4.22–5.81)
RDW: 12.9 % (ref 11.5–15.5)
WBC: 4.5 10*3/uL (ref 4.0–10.5)
nRBC: 0 % (ref 0.0–0.2)

## 2019-04-17 LAB — COMPREHENSIVE METABOLIC PANEL
ALT: 12 U/L (ref 0–44)
AST: 15 U/L (ref 15–41)
Albumin: 3.8 g/dL (ref 3.5–5.0)
Alkaline Phosphatase: 71 U/L (ref 38–126)
Anion gap: 7 (ref 5–15)
BUN: 21 mg/dL (ref 8–23)
CO2: 26 mmol/L (ref 22–32)
Calcium: 9.3 mg/dL (ref 8.9–10.3)
Chloride: 107 mmol/L (ref 98–111)
Creatinine, Ser: 1.34 mg/dL — ABNORMAL HIGH (ref 0.61–1.24)
GFR calc Af Amer: 54 mL/min — ABNORMAL LOW (ref >60)
GFR calc non Af Amer: 46 mL/min — ABNORMAL LOW (ref >60)
Glucose, Bld: 95 mg/dL (ref 70–99)
Potassium: 4.3 mmol/L (ref 3.5–5.1)
Sodium: 140 mmol/L (ref 135–145)
Total Bilirubin: 1 mg/dL (ref 0.3–1.2)
Total Protein: 6.8 g/dL (ref 6.5–8.1)

## 2019-04-17 LAB — PSA: Prostatic Specific Antigen: 1.2 ng/mL (ref 0.00–4.00)

## 2019-04-17 MED ORDER — CYANOCOBALAMIN 1000 MCG/ML IJ SOLN
1000.0000 ug | Freq: Once | INTRAMUSCULAR | Status: AC
  Administered 2019-04-17: 1000 ug via INTRAMUSCULAR
  Filled 2019-04-17: qty 1

## 2019-04-17 NOTE — Progress Notes (Signed)
Pt in for follow up, denies any concerns today. 

## 2019-04-17 NOTE — Progress Notes (Signed)
[facility] day:  04/17/2019    Chief Complaint: Charles Flores is an 85 y.o. male with prostate cancer and B12 deficiency who is seen for  3 month assessment.  Pertinent oncology history Patient previously follows up with Dr. Mike Gip.  He switched care to me on 09/05/2018. The patient is a 85 year old African American gentleman with prostate cancer and a rising PSA on Lupron.  Prostate cancer was discovered approximately 20 years secondary to an elevated PSA.  He underwent biopsy at [facility] (no report available). Initial PSA was 774.   I have personally reviewed the radiological images as listed and agreed with the findings in the report. Abdomen and pelvic CT on 01/05/2006 revealed an enlarged and heterogeneous prostate with mass effect on the base of the bladder.  There was no adenopathy.  Chest, abdomen, and pelvic CT on 03/15/2016 revealed moderate centrilobular emphysema.  There were mild lucent mottled appearance of the osseous structures (cannot exclude multiple myeloma).  There were scattered pulmonary nodules (4 mm or less in size).  There was a 2.7 cm low-attenuation left thyroid nodule.    Abdomen and pelvic CT on 12/07/2016 revealed no new or progressive findings.  There was no evidence of lymphadenopathy in the abdomen or pelvis. There was a stable appearance of heterogeneous/mottled bony mineralization.  Abdomen and pelvic CT on 11/27/2017 was stable with no evidence of adenopathy within the abdomen and pelvis.  There were no suspicious bone lesions.  There was a stable 3 mm RLL nodule.  Work-up on 03/17/2016 revealed no monoclonal protein.  SPEP revealed no monoclonal protein.  Kappa free light chains were 28.9, lambda free light chains 20.7 with a ratio of 1.40 (normal).  Immunoglobulins were normal (IgG 1391, IgA 282, IgM 29).  24 hour urine revealed no monoclonal protein. Thyroid ultrasound on 03/22/2016 revealed a  solitary 2.9 cm left lower pole nodule.  Thyroid biopsy on 04/14/2016 was benign (Bethesda category II) with abundant foamy macrophages and colloid. The cytologic findings were compatible with a cystic colloid nodule  Bone scan on 04/08/2014, 12/29/2014, 02/23/2016, 12/07/2016, and 11/27/2017 revealed no evidence of metastatic disease.  Patient has been on Lupron and Casodex.  He receives Lupron 30 mg every 4 months (last 12/06/2017).    PSA has been followed: 127.6 on 06/30/2014, 168.3 on 12/02/2014, 63.32 on 02/01/2015, 40.31 on 03/03/2015, 29.12 on 04/08/2015, 11.28 on 07/14/2015, 11.02 on 08/17/2015, 10.35 on 11/15/2015, 14.37 on 02/15/2016, 12.46 on 06/06/2016, 13.5 on 06/23/2016, 16.63 on 09/05/2016, 20.77 on 11/07/2016, 16.41 on 12/11/2016, 26.69 on 02/27/2017, 25.67 on 04/02/2017, 19.16 on 05/10/2017, 18.94 on 07/02/2017, 28.28 on 10/01/2017, 30.23 on 12/06/2017, and 34.56 on 03/14/2018.  He has a normocytic anemia. He was diagnosed with B12 deficiency on 12/24/2014. B12 was 169 (low).  He began B12 supplimentation on 02/01/2015 (last 11/15/2017).  Parietal cell antibody was positive on 07/02/2017.  Folate was 11 on 12/24/2014 and 12 on 06/06/2016.  Normal studies included a CMP, ferritin, iron studies, folate, and TSH. TSH and free T4 were normal on 04/02/2017.   # Limited abdominal ultrasound was done on 03/14/2018 revealed a 21.8 x 8.3 x 10.2 cm fatty mass with internal calcifications.  Findings felt to correspond to the CT findings on 11/2017, and not significantly changed from 12/2015 scan.  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  Neoplasm felt to be unlikely.  INTERVAL HISTORY Charles Flores is a 85 y.o.  male who has above history reviewed by me today presents for follow up visit for management of prostate cancer. Patient has been on Zytiga 750 mg daily as well as prednisone 5 mg twice daily.  He tolerates fine.  Denies any more dizziness Episodes.    He is on midodrine. No new complaints.  Past Medical History:  Diagnosis Date  . Anemia 12/24/2014  . Arthritis   . B12 deficiency anemia 01/01/2015  . Cataract   . Coronary artery disease   . GERD (gastroesophageal reflux disease)   . Hyperlipidemia   . Hyperlipidemia   . Prostate cancer ([place])   . Renal insufficiency 04/15/2017  . Thyroid nodule 03/14/2016    Past Surgical History:  Procedure Laterality Date  . PROSTATE BIOPSY      Family History  Problem Relation Age of Onset  . Diabetes Sister   . Stroke Sister   . Gastric cancer Sister   . Cancer Mother   . Breast cancer Mother   . Kidney disease Father    Social History   Socioeconomic History  . Marital status: Single    Spouse name: Not on file  . Number of children: Not on file  . Years of education: Not on file  . Highest education level: Not on file  Occupational History  . Not on file  Social Needs  . Financial resource strain: Not on file  . Food insecurity    Worry: Not on file    Inability: Not on file  . Transportation needs    Medical: Not on file    Non-medical: Not on file  Tobacco Use  . Smoking status: Former Smoker    Packs/day: 0.50    Years: 15.00    Pack years: 7.50    Types: Cigarettes  . Smokeless tobacco: Never Used  . Tobacco comment: quit 40 years  Substance and Sexual Activity  . Alcohol use: No    Alcohol/week: 0.0 standard drinks  . Drug use: No  . Sexual activity: Yes    Partners: Male  Lifestyle  . Physical activity    Days per week: Not on file    Minutes per session: Not on file  . Stress: Not on file  Relationships  . Social Herbalist on phone: Not on file    Gets together: Not on file    Attends religious service: Not on file    Active member of club or organization: Not on file    Attends meetings of clubs or organizations: Not on file    Relationship status: Not on file  . Intimate partner violence    Fear of current or ex partner: Not on  file    Emotionally abused: Not on file    Physically abused: Not on file    Forced sexual activity: Not on file  Other Topics Concern  . Not on file  Social History Narrative  . Not on file     Allergies: No Known Allergies  Current Outpatient Medications  Medication Sig Dispense Refill  . abiraterone acetate (ZYTIGA) 250 MG tablet TAKE 3 TABLETS BY MOUTH DAILY. TAKE ON AN EMPTY STOMACH 1 HOUR BEFORE OR 2 HOURS AFTER A MEAL 90 tablet 0  . midodrine (PROAMATINE) 2.5 MG tablet Take 1 tablet (2.5 mg total) by mouth 3 (three) times daily with meals. 270 tablet 2  . omeprazole (PRILOSEC) 20 MG capsule TAKE 1 CAPSULE BY MOUTH EVERY DAY 90 capsule 0  . predniSONE (DELTASONE)  5 MG tablet TAKE 1 TABLET (5 MG TOTAL) BY MOUTH 2 (TWO) TIMES DAILY WITH A MEAL. 60 tablet 3   No current facility-administered medications for this visit.    Review of Systems  Constitutional: Positive for fatigue. Negative for appetite change, chills, fever and unexpected weight change.  HENT:   Negative for hearing loss and voice change.   Eyes: Negative for eye problems and icterus.  Respiratory: Negative for chest tightness, cough and shortness of breath.   Cardiovascular: Negative for chest pain and leg swelling.  Gastrointestinal: Negative for abdominal distention and abdominal pain.  Endocrine: Negative for hot flashes.  Genitourinary: Negative for difficulty urinating, dysuria and frequency.   Musculoskeletal: Negative for arthralgias.  Skin: Negative for itching and rash.  Neurological: Negative for light-headedness and numbness.  Hematological: Negative for adenopathy. Does not bruise/bleed easily.  Psychiatric/Behavioral: Negative for confusion.    Performance status (ECOG): 1 - Symptomatic but completely ambulatory  Vital Signs BP (!) 145/86 (BP Location: Left Arm, Patient Position: Sitting)   Pulse 75   Temp (!) 97.1 F (36.2 C) (Tympanic)   Resp 18   Wt 145 lb 1.6 oz (65.8 kg)   BMI 20.24  kg/m   Physical Exam  Constitutional: He is oriented to person, place, and time. He appears healthy. No distress.  Thin, frail appearance  HENT:  Head: Normocephalic and atraumatic.  Nose: Nose normal.  Mouth/Throat: Oropharynx is clear and moist and mucous membranes are normal. He has dentures. No oropharyngeal exudate.  Eyes: Pupils are equal, round, and reactive to light. EOM are normal. No scleral icterus.  Neck: Normal range of motion. Neck supple. No tracheal deviation present. No thyromegaly present.  Cardiovascular: Normal rate, regular rhythm, normal heart sounds and intact distal pulses. Exam reveals no gallop and no friction rub.  No murmur heard. Pulmonary/Chest: Effort normal and breath sounds normal. No respiratory distress. He has no wheezes. He has no rales. He exhibits no tenderness.  Abdominal: Soft. Bowel sounds are normal. He exhibits no distension. Mass: Large right flank mass: Previously ultrasound consistent with a lipomatous growth fat necrosis and resultant calcification. There is no abdominal tenderness.  Musculoskeletal: Normal range of motion.        General: No tenderness or edema.  Lymphadenopathy:    He has no cervical adenopathy.    He has no axillary adenopathy.       Right: No supraclavicular adenopathy present.       Left: No supraclavicular adenopathy present.  Neurological: He is alert and oriented to person, place, and time. No cranial nerve deficit. He exhibits normal muscle tone. Coordination normal.  Skin: Skin is warm and dry. No rash noted. He is not diaphoretic. No erythema.  Psychiatric: Mood and affect normal.  Nursing note and vitals reviewed.  CBC    Component Value Date/Time   WBC 4.5 04/17/2019 0906   RBC 3.23 (L) 04/17/2019 0906   HGB 11.3 (L) 04/17/2019 0906   HCT 33.0 (L) 04/17/2019 0906   PLT 178 04/17/2019 0906   MCV 102.2 (H) 04/17/2019 0906   MCH 35.0 (H) 04/17/2019 0906   MCHC 34.2 04/17/2019 0906   RDW 12.9 04/17/2019  0906   LYMPHSABS 1.1 04/17/2019 0906   MONOABS 0.5 04/17/2019 0906   EOSABS 0.0 04/17/2019 0906   BASOSABS 0.0 04/17/2019 0906   CMP Latest Ref Rng & Units 04/17/2019 03/20/2019 03/13/2019  Glucose 70 - 99 mg/dL 95 92 109(H)  BUN 8 - 23 mg/dL 21 31(H) 31(H)  Creatinine 0.61 - 1.24 mg/dL 1.34(H) 1.56(H) 1.52(H)  Sodium 135 - 145 mmol/L 140 140 139  Potassium 3.5 - 5.1 mmol/L 4.3 3.9 3.6  Chloride 98 - 111 mmol/L 107 108 108  CO2 22 - 32 mmol/L '26 23 23  ' Calcium 8.9 - 10.3 mg/dL 9.3 9.7 9.3  Total Protein 6.5 - 8.1 g/dL 6.8 - 6.7  Total Bilirubin 0.3 - 1.2 mg/dL 1.0 - 1.1  Alkaline Phos 38 - 126 U/L 71 - 62  AST 15 - 41 U/L 15 - 15  ALT 0 - 44 U/L 12 - 13    Assessment and Plan 85 y.o. male present for management of prostate cancer and vitamin B12 deficiency. 1. Prostate cancer ([place])   2. Anemia due to stage 3 chronic kidney disease ([place])   3. Orthostatic hypotension   4. B12 deficiency   5. Encounter for antineoplastic chemotherapy    #Hypotension, resolved.  Encouraged patient to stay well-hydrated. #Castration resistant prostate cancer, tolerating Zytiga 750 mg daily and prednisone 5 mg twice daily. PSA has responded really well and decreased to 1.2. Continue current regimen Continue omeprazole for GI prophylaxis.  #Androgen deprivation therapy, Lupron 30 mg was given on 01/21/2019.  Due in October 2020 .#Vitamin B12 deficiency, labs are reviewed.  We will proceed with vitamin B12 injection today. Continue vitamin B12 monthly.  #Anemia of CKD, hemoglobin is stable at 11..    Follow-up in 8 weeks No orders of the defined types were placed in this encounter.   We spent sufficient time to discuss many aspect of care, questions were answered to patient's satisfaction. Follow-up in 1 week for reassessment.  Earlie Server, MD, PhD 04/17/2019

## 2019-04-22 ENCOUNTER — Other Ambulatory Visit: Payer: Self-pay | Admitting: Oncology

## 2019-04-22 DIAGNOSIS — C61 Malignant neoplasm of prostate: Secondary | ICD-10-CM

## 2019-04-22 NOTE — Telephone Encounter (Signed)
CBC with Differential Order: [id number] Status:  Final result Visible to patient:  No (not released) Next appt:  05/16/2019 at 01:15 PM in Oncology ([facility]-MO INJECTION) Dx:  Prostate cancer ([place])  Ref Range & Units 5d ago 68mo ago 41mo ago  WBC 4.0 - 10.5 K/uL 4.5  4.6  4.3   RBC 4.22 - 5.81 MIL/uL 3.23Low   3.22Low   3.03Low    Hemoglobin 13.0 - 17.0 g/dL 11.3Low   11.0Low   10.4Low    HCT 39.0 - 52.0 % 33.0Low   32.5Low   30.8Low    MCV 80.0 - 100.0 fL 102.2High   100.9High   101.7High    MCH 26.0 - 34.0 pg 35.0High   34.2High   34.3High    MCHC 30.0 - 36.0 g/dL 34.2  33.8  33.8   RDW 11.5 - 15.5 % 12.9  12.5  12.9   Platelets 150 - 400 K/uL 178  165  160   nRBC 0.0 - 0.2 % 0.0  0.0  0.0   Neutrophils Relative % % 64  67  68   Neutro Abs 1.7 - 7.7 K/uL 2.8  3.0  2.9   Lymphocytes Relative % 24  21  20    Lymphs Abs 0.7 - 4.0 K/uL 1.1  1.0  0.9   Monocytes Relative % 11  11  12    Monocytes Absolute 0.1 - 1.0 K/uL 0.5  0.5  0.5   Eosinophils Relative % 0  0  0   Eosinophils Absolute 0.0 - 0.5 K/uL 0.0  0.0  0.0   Basophils Relative % 1  1  0   Basophils Absolute 0.0 - 0.1 K/uL 0.0  0.0  0.0   Immature Granulocytes % 0  0  0   Abs Immature Granulocytes 0.00 - 0.07 K/uL 0.01  0.01 CM  0.01 CM   Comment: Performed at [facility], [address] [place], [place] [address]  Resulting Agency  [facility] CLIN LAB [facility] CLIN LAB [facility] CLIN LAB      Specimen Collected: 04/17/19 09:06 Last Resulted: 04/17/19 09:16     Lab Flowsheet   Order Details   View Encounter   Lab and Collection Details   Routing   Result History     CM=Additional comments      Other Results from 04/17/2019  PSA Order: [id number]  Status:  Final result Visible to patient:  No (not released) Next appt:  05/16/2019 at 01:15 PM in Oncology ([facility]-MO INJECTION) Dx:  Prostate cancer ([place])  Ref Range & Units 5d ago 15mo ago 31mo ago  Prostatic Specific Antigen 0.00 - 4.00 ng/mL 1.20  1.62 CM  2.09 CM   Comment:  (NOTE)  While PSA levels of <=4.0 ng/ml are reported as reference range, some  men with levels below 4.0 ng/ml can have prostate cancer and many men  with PSA above 4.0 ng/ml do not have prostate cancer. Other tests  such as free PSA, age specific reference ranges, PSA velocity and PSA  doubling time may be helpful especially in men less than 48 years  old.  Performed at [facility], [place] [address], [place],   [address]   Resulting Agency  [facility] CLIN LAB [facility] CLIN LAB [facility] CLIN LAB      Specimen Collected: 04/17/19 09:06 Last Resulted: 04/17/19 20:20     Lab Flowsheet   Order Details   View Encounter   Lab and Collection Details   Routing  Result History     CM=Additional comments        Contains abnormal data Comprehensive metabolic panel Order: [id number]  Status:  Final result Visible to patient:  No (not released) Next appt:  05/16/2019 at 01:15 PM in Oncology ([facility]-MO INJECTION) Dx:  Prostate cancer ([place])  Ref Range & Units 5d ago (04/17/19) 73mo ago (03/20/19) 34mo ago (03/13/19)  Sodium 135 - 145 mmol/L 140  140  139   Potassium 3.5 - 5.1 mmol/L 4.3  3.9  3.6   Chloride 98 - 111 mmol/L 107  108  108   CO2 22 - 32 mmol/L 26  23  23    Glucose, Bld 70 - 99 mg/dL 95  92  109High    BUN 8 - 23 mg/dL 21  31High   31High    Creatinine, Ser 0.61 - 1.24 mg/dL 1.34High   1.56High   1.52High    Calcium 8.9 - 10.3 mg/dL 9.3  9.7  9.3   Total Protein 6.5 - 8.1 g/dL 6.8   6.7   Albumin 3.5 - 5.0 g/dL 3.8   3.9   AST 15 - 41 U/L 15   15   ALT 0 - 44 U/L 12   13   Alkaline Phosphatase 38 - 126 U/L 71   62   Total Bilirubin 0.3 - 1.2 mg/dL 1.0   1.1   GFR calc non Af Amer >60 mL/min 46Low   39Low   40Low    GFR calc Af Amer >60 mL/min 54Low   45Low   46Low    Anion gap 5 - 15 7  9  CM  8 CM   Comment: Performed at [facility], [address] [place], [place] [address]  Resulting Agency  [facility] CLIN LAB [facility] CLIN LAB [facility] CLIN LAB      Specimen Collected:  04/17/19 09:06 Last Resulted: 04/17/19 09:31

## 2019-04-29 MED FILL — ABIRATERONE ACETATE 250 MG: 250 | 30 days supply | Qty: 90 | Fill #0

## 2019-04-29 MED FILL — predniSONE 5 MG TABS: 5 | 30 days supply | Qty: 60 | Fill #3

## 2019-05-07 ENCOUNTER — Other Ambulatory Visit: Payer: Self-pay | Admitting: Adult Health

## 2019-05-07 DIAGNOSIS — I950 Idiopathic hypotension: Secondary | ICD-10-CM

## 2019-05-07 DIAGNOSIS — I951 Orthostatic hypotension: Secondary | ICD-10-CM

## 2019-05-16 ENCOUNTER — Inpatient Hospital Stay: Payer: Medicare Other | Attending: Oncology

## 2019-05-19 ENCOUNTER — Ambulatory Visit: Payer: Medicare Other

## 2019-05-22 ENCOUNTER — Other Ambulatory Visit: Payer: Self-pay | Admitting: Oncology

## 2019-05-22 DIAGNOSIS — C61 Malignant neoplasm of prostate: Secondary | ICD-10-CM

## 2019-05-22 MED FILL — predniSONE 5 MG TABS: 5 | 30 days supply | Qty: 60 | Fill #0

## 2019-05-23 ENCOUNTER — Inpatient Hospital Stay: Payer: Medicare Other

## 2019-05-23 ENCOUNTER — Inpatient Hospital Stay: Payer: Medicare Other | Admitting: Oncology

## 2019-05-28 ENCOUNTER — Other Ambulatory Visit: Payer: Self-pay

## 2019-05-28 ENCOUNTER — Encounter: Payer: Self-pay | Admitting: Oncology

## 2019-05-28 NOTE — Progress Notes (Signed)
Patient pre screened for office appointment, no questions or concerns today. 

## 2019-05-29 ENCOUNTER — Inpatient Hospital Stay: Payer: Medicare Other

## 2019-05-29 ENCOUNTER — Inpatient Hospital Stay (HOSPITAL_BASED_OUTPATIENT_CLINIC_OR_DEPARTMENT_OTHER): Payer: Medicare Other | Admitting: Oncology

## 2019-05-29 ENCOUNTER — Other Ambulatory Visit: Payer: Self-pay

## 2019-05-29 ENCOUNTER — Inpatient Hospital Stay: Payer: Medicare Other | Attending: Oncology

## 2019-05-29 VITALS — BP 116/69 | HR 79 | Temp 95.4°F | Resp 18 | Wt 147.3 lb

## 2019-05-29 DIAGNOSIS — Z5111 Encounter for antineoplastic chemotherapy: Secondary | ICD-10-CM

## 2019-05-29 DIAGNOSIS — E538 Deficiency of other specified B group vitamins: Secondary | ICD-10-CM

## 2019-05-29 DIAGNOSIS — I951 Orthostatic hypotension: Secondary | ICD-10-CM

## 2019-05-29 DIAGNOSIS — D519 Vitamin B12 deficiency anemia, unspecified: Secondary | ICD-10-CM | POA: Diagnosis not present

## 2019-05-29 DIAGNOSIS — Z5181 Encounter for therapeutic drug level monitoring: Secondary | ICD-10-CM

## 2019-05-29 DIAGNOSIS — M7989 Other specified soft tissue disorders: Secondary | ICD-10-CM | POA: Diagnosis not present

## 2019-05-29 DIAGNOSIS — Z79818 Long term (current) use of other agents affecting estrogen receptors and estrogen levels: Secondary | ICD-10-CM

## 2019-05-29 DIAGNOSIS — C61 Malignant neoplasm of prostate: Secondary | ICD-10-CM | POA: Diagnosis present

## 2019-05-29 DIAGNOSIS — D631 Anemia in chronic kidney disease: Secondary | ICD-10-CM

## 2019-05-29 LAB — CBC WITH DIFFERENTIAL/PLATELET
Abs Immature Granulocytes: 0.01 10*3/uL (ref 0.00–0.07)
Basophils Absolute: 0 10*3/uL (ref 0.0–0.1)
Basophils Relative: 0 %
Eosinophils Absolute: 0 10*3/uL (ref 0.0–0.5)
Eosinophils Relative: 1 %
HCT: 33.5 % — ABNORMAL LOW (ref 39.0–52.0)
Hemoglobin: 11.4 g/dL — ABNORMAL LOW (ref 13.0–17.0)
Immature Granulocytes: 0 %
Lymphocytes Relative: 19 %
Lymphs Abs: 1 10*3/uL (ref 0.7–4.0)
MCH: 34.1 pg — ABNORMAL HIGH (ref 26.0–34.0)
MCHC: 34 g/dL (ref 30.0–36.0)
MCV: 100.3 fL — ABNORMAL HIGH (ref 80.0–100.0)
Monocytes Absolute: 0.6 10*3/uL (ref 0.1–1.0)
Monocytes Relative: 12 %
Neutro Abs: 3.3 10*3/uL (ref 1.7–7.7)
Neutrophils Relative %: 68 %
Platelets: 185 10*3/uL (ref 150–400)
RBC: 3.34 MIL/uL — ABNORMAL LOW (ref 4.22–5.81)
RDW: 12.8 % (ref 11.5–15.5)
WBC: 4.9 10*3/uL (ref 4.0–10.5)
nRBC: 0 % (ref 0.0–0.2)

## 2019-05-29 LAB — COMPREHENSIVE METABOLIC PANEL
ALT: 11 U/L (ref 0–44)
AST: 15 U/L (ref 15–41)
Albumin: 3.9 g/dL (ref 3.5–5.0)
Alkaline Phosphatase: 67 U/L (ref 38–126)
Anion gap: 7 (ref 5–15)
BUN: 27 mg/dL — ABNORMAL HIGH (ref 8–23)
CO2: 24 mmol/L (ref 22–32)
Calcium: 9.2 mg/dL (ref 8.9–10.3)
Chloride: 108 mmol/L (ref 98–111)
Creatinine, Ser: 1.45 mg/dL — ABNORMAL HIGH (ref 0.61–1.24)
GFR calc Af Amer: 49 mL/min — ABNORMAL LOW (ref >60)
GFR calc non Af Amer: 42 mL/min — ABNORMAL LOW (ref >60)
Glucose, Bld: 90 mg/dL (ref 70–99)
Potassium: 3.6 mmol/L (ref 3.5–5.1)
Sodium: 139 mmol/L (ref 135–145)
Total Bilirubin: 1 mg/dL (ref 0.3–1.2)
Total Protein: 6.8 g/dL (ref 6.5–8.1)

## 2019-05-29 LAB — PSA: Prostatic Specific Antigen: 1.02 ng/mL (ref 0.00–4.00)

## 2019-05-29 MED ORDER — CYANOCOBALAMIN 1000 MCG/ML IJ SOLN
1000.0000 ug | Freq: Once | INTRAMUSCULAR | Status: AC
  Administered 2019-05-29: 10:00:00 1000 ug via INTRAMUSCULAR
  Filled 2019-05-29: qty 1

## 2019-05-29 MED ORDER — LEUPROLIDE ACETATE (4 MONTH) 30 MG IM KIT
30.0000 mg | PACK | Freq: Once | INTRAMUSCULAR | Status: AC
  Administered 2019-05-29: 30 mg via INTRAMUSCULAR
  Filled 2019-05-29: qty 30

## 2019-05-29 NOTE — Progress Notes (Signed)
[facility] day:  05/29/2019    Chief Complaint: Charles Flores is an 85 y.o. male with prostate cancer and B12 deficiency who is seen for  3 month assessment.  Pertinent oncology history Patient previously follows up with Dr. Mike Gip.  He switched care to me on 09/05/2018. The patient is a 85 year old African American gentleman with prostate cancer and a rising PSA on Lupron.  Prostate cancer was discovered approximately 20 years secondary to an elevated PSA.  He underwent biopsy at [facility] (no report available). Initial PSA was 774.   I have personally reviewed the radiological images as listed and agreed with the findings in the report. Abdomen and pelvic CT on 01/05/2006 revealed an enlarged and heterogeneous prostate with mass effect on the base of the bladder.  There was no adenopathy.  Chest, abdomen, and pelvic CT on 03/15/2016 revealed moderate centrilobular emphysema.  There were mild lucent mottled appearance of the osseous structures (cannot exclude multiple myeloma).  There were scattered pulmonary nodules (4 mm or less in size).  There was a 2.7 cm low-attenuation left thyroid nodule.    Abdomen and pelvic CT on 12/07/2016 revealed no new or progressive findings.  There was no evidence of lymphadenopathy in the abdomen or pelvis. There was a stable appearance of heterogeneous/mottled bony mineralization.  Abdomen and pelvic CT on 11/27/2017 was stable with no evidence of adenopathy within the abdomen and pelvis.  There were no suspicious bone lesions.  There was a stable 3 mm RLL nodule.  Work-up on 03/17/2016 revealed no monoclonal protein.  SPEP revealed no monoclonal protein.  Kappa free light chains were 28.9, lambda free light chains 20.7 with a ratio of 1.40 (normal).  Immunoglobulins were normal (IgG 1391, IgA 282, IgM 29).  24 hour urine revealed no monoclonal protein. Thyroid ultrasound on 03/22/2016 revealed a  solitary 2.9 cm left lower pole nodule.  Thyroid biopsy on 04/14/2016 was benign (Bethesda category II) with abundant foamy macrophages and colloid. The cytologic findings were compatible with a cystic colloid nodule  Bone scan on 04/08/2014, 12/29/2014, 02/23/2016, 12/07/2016, and 11/27/2017 revealed no evidence of metastatic disease.  Patient has been on Lupron and Casodex.  He receives Lupron 30 mg every 4 months (last 12/06/2017).    PSA has been followed: 127.6 on 06/30/2014, 168.3 on 12/02/2014, 63.32 on 02/01/2015, 40.31 on 03/03/2015, 29.12 on 04/08/2015, 11.28 on 07/14/2015, 11.02 on 08/17/2015, 10.35 on 11/15/2015, 14.37 on 02/15/2016, 12.46 on 06/06/2016, 13.5 on 06/23/2016, 16.63 on 09/05/2016, 20.77 on 11/07/2016, 16.41 on 12/11/2016, 26.69 on 02/27/2017, 25.67 on 04/02/2017, 19.16 on 05/10/2017, 18.94 on 07/02/2017, 28.28 on 10/01/2017, 30.23 on 12/06/2017, and 34.56 on 03/14/2018.  He has a normocytic anemia. He was diagnosed with B12 deficiency on 12/24/2014. B12 was 169 (low).  He began B12 supplimentation on 02/01/2015 (last 11/15/2017).  Parietal cell antibody was positive on 07/02/2017.  Folate was 11 on 12/24/2014 and 12 on 06/06/2016.  Normal studies included a CMP, ferritin, iron studies, folate, and TSH. TSH and free T4 were normal on 04/02/2017.   # Limited abdominal ultrasound was done on 03/14/2018 revealed a 21.8 x 8.3 x 10.2 cm fatty mass with internal calcifications.  Findings felt to correspond to the CT findings on 11/2017, and not significantly changed from 12/2015 scan.  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  Neoplasm felt to be unlikely.  INTERVAL HISTORY Charles Flores is a 85 y.o.  male who has above history reviewed by me today presents for follow up visit for management of prostate cancer. He reports feeling "fine".  Taking Zytiga 764m daily as well as prednisone 575mBID.  He tolerates well. Denies any nausea, vomiting,  diarrhea.  He is on Midodrine for chronic hypotension.  Reports bilateral lower extremities swelling for the past one month, swelling gets worse at the end of day and improves in the morning.    Past Medical History:  Diagnosis Date  . Anemia 12/24/2014  . Arthritis   . B12 deficiency anemia 01/01/2015  . Cataract   . Coronary artery disease   . GERD (gastroesophageal reflux disease)   . Hyperlipidemia   . Hyperlipidemia   . Prostate cancer ([facility]  . Renal insufficiency 04/15/2017  . Thyroid nodule 03/14/2016    Past Surgical History:  Procedure Laterality Date  . PROSTATE BIOPSY      Family History  Problem Relation Age of Onset  . Diabetes Sister   . Stroke Sister   . Gastric cancer Sister   . Cancer Mother   . Breast cancer Mother   . Kidney disease Father    Social History   Socioeconomic History  . Marital status: Single    Spouse name: Not on file  . Number of children: Not on file  . Years of education: Not on file  . Highest education level: Not on file  Occupational History  . Not on file  Social Needs  . Financial resource strain: Not on file  . Food insecurity    Worry: Not on file    Inability: Not on file  . Transportation needs    Medical: Not on file    Non-medical: Not on file  Tobacco Use  . Smoking status: Former Smoker    Packs/day: 0.50    Years: 15.00    Pack years: 7.50    Types: Cigarettes  . Smokeless tobacco: Never Used  . Tobacco comment: quit 40 years  Substance and Sexual Activity  . Alcohol use: No    Alcohol/week: 0.0 standard drinks  . Drug use: No  . Sexual activity: Yes    Partners: Male  Lifestyle  . Physical activity    Days per week: Not on file    Minutes per session: Not on file  . Stress: Not on file  Relationships  . Social coHerbalistn phone: Not on file    Gets together: Not on file    Attends religious service: Not on file    Active member of club or organization: Not on file    Attends  meetings of clubs or organizations: Not on file    Relationship status: Not on file  . Intimate partner violence    Fear of current or ex partner: Not on file    Emotionally abused: Not on file    Physically abused: Not on file    Forced sexual activity: Not on file  Other Topics Concern  . Not on file  Social History Narrative  . Not on file     Allergies: No Known Allergies  Current Outpatient Medications  Medication Sig Dispense Refill  . abiraterone acetate (ZYTIGA) 250 MG tablet TAKE 3 TABLETS BY MOUTH DAILY. TAKE ON AN EMPTY STOMACH 1 HOUR BEFORE OR 2 HOURS AFTER A MEAL 90 tablet 0  . midodrine (PROAMATINE) 2.5 MG tablet TAKE 1 TABLET (2.5 MG TOTAL) BY MOUTH 3 (THREE) TIMES DAILY WITH MEALS. [place]  tablet 2  . omeprazole (PRILOSEC) 20 MG capsule TAKE 1 CAPSULE BY MOUTH EVERY DAY 90 capsule 0  . predniSONE (DELTASONE) 5 MG tablet TAKE 1 TABLET (5 MG TOTAL) BY MOUTH 2 (TWO) TIMES DAILY WITH A MEAL. 60 tablet 3   No current facility-administered medications for this visit.    Review of Systems  Constitutional: Positive for fatigue. Negative for appetite change, chills, fever and unexpected weight change.  HENT:   Negative for hearing loss and voice change.   Eyes: Negative for eye problems and icterus.  Respiratory: Negative for chest tightness, cough and shortness of breath.   Cardiovascular: Positive for leg swelling. Negative for chest pain.  Gastrointestinal: Negative for abdominal distention and abdominal pain.  Endocrine: Negative for hot flashes.  Genitourinary: Negative for difficulty urinating, dysuria and frequency.   Musculoskeletal: Negative for arthralgias.  Skin: Negative for itching and rash.  Neurological: Negative for light-headedness and numbness.  Hematological: Negative for adenopathy. Does not bruise/bleed easily.  Psychiatric/Behavioral: Negative for confusion.    Performance status (ECOG): 1 - Symptomatic but completely ambulatory  Vital Signs BP  116/69 (BP Location: Left Arm)   Pulse 79   Temp (!) 95.4 F (35.2 C)   Resp 18   Wt 147 lb 4.8 oz (66.8 kg)   BMI 20.54 kg/m   Physical Exam  Constitutional: He is oriented to person, place, and time. He appears healthy. No distress.  Thin, frail appearance  HENT:  Head: Normocephalic and atraumatic.  Nose: Nose normal.  Mouth/Throat: Oropharynx is clear and moist and mucous membranes are normal. He has dentures. No oropharyngeal exudate.  Eyes: Pupils are equal, round, and reactive to light. EOM are normal. No scleral icterus.  Neck: Normal range of motion. Neck supple. No tracheal deviation present. No thyromegaly present.  Cardiovascular: Normal rate, regular rhythm, normal heart sounds and intact distal pulses. Exam reveals no gallop and no friction rub.  No murmur heard. Pulmonary/Chest: Effort normal and breath sounds normal. No respiratory distress. He has no wheezes. He has no rales. He exhibits no tenderness.  Abdominal: Soft. Bowel sounds are normal. He exhibits no distension. Mass: Large right flank mass: Previously ultrasound consistent with a lipomatous growth fat necrosis and resultant calcification. There is no abdominal tenderness.  Musculoskeletal: Normal range of motion.        General: No tenderness or edema.  Lymphadenopathy:    He has no cervical adenopathy.    He has no axillary adenopathy.       Right: No supraclavicular adenopathy present.       Left: No supraclavicular adenopathy present.  Neurological: He is alert and oriented to person, place, and time. No cranial nerve deficit. He exhibits normal muscle tone. Coordination normal.  Skin: Skin is warm and dry. No rash noted. He is not diaphoretic. No erythema.  Psychiatric: Mood and affect normal.  Nursing note and vitals reviewed.  CBC    Component Value Date/Time   WBC 4.9 05/29/2019 0834   RBC 3.34 (L) 05/29/2019 0834   HGB 11.4 (L) 05/29/2019 0834   HCT 33.5 (L) 05/29/2019 0834   PLT 185  05/29/2019 0834   MCV 100.3 (H) 05/29/2019 0834   MCH 34.1 (H) 05/29/2019 0834   MCHC 34.0 05/29/2019 0834   RDW 12.8 05/29/2019 0834   LYMPHSABS 1.0 05/29/2019 0834   MONOABS 0.6 05/29/2019 0834   EOSABS 0.0 05/29/2019 0834   BASOSABS 0.0 05/29/2019 0834   CMP Latest Ref Rng & Units 05/29/2019 04/17/2019  03/20/2019  Glucose 70 - 99 mg/dL 90 95 92  BUN 8 - 23 mg/dL 27(H) 21 31(H)  Creatinine 0.61 - 1.24 mg/dL 1.45(H) 1.34(H) 1.56(H)  Sodium 135 - 145 mmol/L 139 140 140  Potassium 3.5 - 5.1 mmol/L 3.6 4.3 3.9  Chloride 98 - 111 mmol/L 108 107 108  CO2 22 - 32 mmol/L '24 26 23  ' Calcium 8.9 - 10.3 mg/dL 9.2 9.3 9.7  Total Protein 6.5 - 8.1 g/dL 6.8 6.8 -  Total Bilirubin 0.3 - 1.2 mg/dL 1.0 1.0 -  Alkaline Phos 38 - 126 U/L 67 71 -  AST 15 - 41 U/L 15 15 -  ALT 0 - 44 U/L 11 12 -    Assessment and Plan 85 y.o. male present for management of prostate cancer and vitamin B12 deficiency. 1. Prostate cancer ([place])   2. Leg swelling   3. Anemia due to vitamin B12 deficiency, unspecified B12 deficiency type   4. Encounter for antineoplastic chemotherapy   5. 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  Continue current regimen.  Continue omeprazole for GI prophylaxis.   # Increased lower extremity swelling, this is a new problem. Likely vein insufficiency vs CHF?  Check US duplex bilateral LE to rule out DVT.   #Androgen deprivation therapy, Lupron 30 mg was given on 01/21/2019. Will give Lupron 18m x1 today.   .Marland KitchenVitamin B12 deficiency, labs are reviewed. Continue vitamin B12 injection monthly.   #Anemia of CKD, hemoglobin is stable at 11.4..    Follow-up in 8 weeks Orders Placed This Encounter  Procedures  . [place]ous Img Lower Bilateral    Standing Status:   Future    Standing Expiration Date:   05/28/2020    Order Specific Question:   Reason for Exam  (SYMPTOM  OR DIAGNOSIS REQUIRED)    Answer:   bilat leg swelling, rule out DVT    Order Specific Question:   Preferred imaging location?    Answer:   [facility]  . CBC with Differential/Platelet    Standing Status:   Future    Standing Expiration Date:   05/28/2020  . Comprehensive metabolic panel    Standing Status:   Future    Standing Expiration Date:   05/28/2020  . PSA    Standing Status:   Future    Standing Expiration Date:   05/28/2020  . Vitamin B12    Standing Status:   Future    Standing Expiration Date:   05/28/2020   Follow up in 3 monhts.  We spent sufficient time to discuss many aspect of care, questions were answered to patient's satisfaction. Follow-up in 1 week for reassessment.  ZEarlie Server MD, PhD 05/29/2019

## 2019-06-02 ENCOUNTER — Other Ambulatory Visit: Payer: Self-pay

## 2019-06-02 ENCOUNTER — Ambulatory Visit
Admission: RE | Admit: 2019-06-02 | Discharge: 2019-06-02 | Disposition: A | Discharge status: Home / Self Care | Payer: Medicare Other | Source: Ambulatory Visit | Attending: Oncology | Admitting: Oncology

## 2019-06-02 DIAGNOSIS — M7989 Other specified soft tissue disorders: Secondary | ICD-10-CM | POA: Diagnosis not present

## 2019-06-02 DIAGNOSIS — R600 Localized edema: Secondary | ICD-10-CM | POA: Diagnosis not present

## 2019-06-05 ENCOUNTER — Other Ambulatory Visit: Payer: Self-pay | Admitting: Oncology

## 2019-06-16 ENCOUNTER — Other Ambulatory Visit: Payer: Self-pay | Admitting: Oncology

## 2019-06-16 DIAGNOSIS — C61 Malignant neoplasm of prostate: Secondary | ICD-10-CM

## 2019-06-16 MED FILL — predniSONE 5 MG TABS: 5 | 30 days supply | Qty: 60 | Fill #1

## 2019-06-16 MED FILL — ABIRATERONE ACETATE 250 MG: 250 | 30 days supply | Qty: 90 | Fill #0

## 2019-06-25 DIAGNOSIS — H2513 Age-related nuclear cataract, bilateral: Secondary | ICD-10-CM | POA: Diagnosis not present

## 2019-06-26 ENCOUNTER — Inpatient Hospital Stay: Payer: Medicare Other | Attending: Oncology

## 2019-07-07 ENCOUNTER — Other Ambulatory Visit: Payer: Self-pay | Admitting: Oncology

## 2019-07-07 DIAGNOSIS — C61 Malignant neoplasm of prostate: Secondary | ICD-10-CM

## 2019-07-10 MED FILL — ABIRATERONE ACETATE 250 MG: 250 | 30 days supply | Qty: 90 | Fill #0

## 2019-07-14 ENCOUNTER — Telehealth: Payer: Self-pay

## 2019-07-14 NOTE — Telephone Encounter (Signed)
Confirmed appointment with patient. klh °

## 2019-07-16 ENCOUNTER — Ambulatory Visit: Payer: Medicare Other | Admitting: Adult Health

## 2019-07-21 ENCOUNTER — Ambulatory Visit: Payer: Medicare Other | Admitting: Adult Health

## 2019-07-24 ENCOUNTER — Inpatient Hospital Stay: Payer: Medicare Other | Attending: Oncology

## 2019-07-28 MED FILL — predniSONE 5 MG TABS: 5 | 30 days supply | Qty: 60 | Fill #2

## 2019-08-07 ENCOUNTER — Ambulatory Visit: Admit: 2019-08-07 | Payer: Medicare Other | Admitting: Ophthalmology

## 2019-08-07 SURGERY — PHACOEMULSIFICATION, CATARACT, WITH IOL INSERTION
Anesthesia: Topical | Laterality: Left

## 2019-08-08 ENCOUNTER — Telehealth: Payer: Self-pay

## 2019-08-08 NOTE — Telephone Encounter (Signed)
Confirmed appointment with patient. klh °

## 2019-08-12 ENCOUNTER — Other Ambulatory Visit: Payer: Self-pay

## 2019-08-12 ENCOUNTER — Ambulatory Visit (INDEPENDENT_AMBULATORY_CARE_PROVIDER_SITE_OTHER): Payer: Medicare Other | Admitting: Adult Health

## 2019-08-12 VITALS — BP 110/64 | HR 96 | Temp 95.5°F | Resp 16 | Ht 71.0 in | Wt 136.0 lb

## 2019-08-12 DIAGNOSIS — R634 Abnormal weight loss: Secondary | ICD-10-CM

## 2019-08-12 DIAGNOSIS — I950 Idiopathic hypotension: Secondary | ICD-10-CM

## 2019-08-12 DIAGNOSIS — R42 Dizziness and giddiness: Secondary | ICD-10-CM

## 2019-08-12 DIAGNOSIS — K219 Gastro-esophageal reflux disease without esophagitis: Secondary | ICD-10-CM | POA: Diagnosis not present

## 2019-08-12 DIAGNOSIS — M542 Cervicalgia: Secondary | ICD-10-CM

## 2019-08-12 DIAGNOSIS — I951 Orthostatic hypotension: Secondary | ICD-10-CM | POA: Diagnosis not present

## 2019-08-12 DIAGNOSIS — C61 Malignant neoplasm of prostate: Secondary | ICD-10-CM

## 2019-08-12 MED ORDER — MIDODRINE HCL 2.5 MG PO TABS: 2.5000 mg | ORAL_TABLET | Freq: Three times a day (TID) | ORAL | 0 refills | Status: DC

## 2019-08-12 NOTE — Progress Notes (Signed)
[facility] [place], [place] [address]  Internal MEDICINE  Office Visit Note  Patient Name: Charles Flores  [id number]  [id number]  Date of Service: 08/18/2019  Chief Complaint  Patient presents with  . Gastroesophageal Reflux  . Hypotension    HPI Pt is here for 4 month follow up on Hypotension, dizziness, GERD. Overall he is doing well.  He denies any new or worsening symptoms. He continue to report brief dizziness when standing up from seat position. He currently takes midodrine and his bp today is 110/64.  He has not had any worsening of his GERD and symptoms appear controlled at this time.      Current Medication: Outpatient Encounter Medications as of 08/12/2019  Medication Sig  . abiraterone acetate (ZYTIGA) 250 MG tablet TAKE 3 TABLETS BY MOUTH DAILY. TAKE ON AN EMPTY STOMACH 1 HOUR BEFORE OR 2 HOURS AFTER A MEAL  . midodrine (PROAMATINE) 2.5 MG tablet Take 1 tablet (2.5 mg total) by mouth 3 (three) times daily with meals.  Marland Kitchen omeprazole (PRILOSEC) 20 MG capsule TAKE 1 CAPSULE BY MOUTH EVERY DAY  . predniSONE (DELTASONE) 5 MG tablet TAKE 1 TABLET (5 MG TOTAL) BY MOUTH 2 (TWO) TIMES DAILY WITH A MEAL.  . [DISCONTINUED] midodrine (PROAMATINE) 2.5 MG tablet TAKE 1 TABLET (2.5 MG TOTAL) BY MOUTH 3 (THREE) TIMES DAILY WITH MEALS.   No facility-administered encounter medications on file as of 08/12/2019.    Surgical History: Past Surgical History:  Procedure Laterality Date  . PROSTATE BIOPSY      Medical History: Past Medical History:  Diagnosis Date  . Anemia 12/24/2014  . Arthritis   . B12 deficiency anemia 01/01/2015  . Cataract   . Coronary artery disease   . GERD (gastroesophageal reflux disease)   . Hyperlipidemia   . Hyperlipidemia   . Prostate cancer ([place])   . Renal insufficiency 04/15/2017  . Thyroid nodule 03/14/2016    Family History: Family History  Problem Relation Age of Onset  . Diabetes Sister   . Stroke Sister   .  Gastric cancer Sister   . Cancer Mother   . Breast cancer Mother   . Kidney disease Father     Social History   Socioeconomic History  . Marital status: Single    Spouse name: Not on file  . Number of children: Not on file  . Years of education: Not on file  . Highest education level: Not on file  Occupational History  . Not on file  Tobacco Use  . Smoking status: Former Smoker    Packs/day: 0.50    Years: 15.00    Pack years: 7.50    Types: Cigarettes  . Smokeless tobacco: Never Used  . Tobacco comment: quit 40 years  Substance and Sexual Activity  . Alcohol use: No    Alcohol/week: 0.0 standard drinks  . Drug use: No  . Sexual activity: Yes    Partners: Male  Other Topics Concern  . Not on file  Social History Narrative  . Not on file   Social Determinants of Health   Financial Resource Strain:   . Difficulty of Paying Living Expenses: Not on file  Food Insecurity:   . Worried About Charity fundraiser in the Last Year: Not on file  . Ran Out of Food in the Last Year: Not on file  Transportation Needs:   . Lack of Transportation (Medical): Not on file  . Lack of Transportation (Non-Medical): Not on  file  Physical Activity:   . Days of Exercise per Week: Not on file  . Minutes of Exercise per Session: Not on file  Stress:   . Feeling of Stress : Not on file  Social Connections:   . Frequency of Communication with Friends and Family: Not on file  . Frequency of Social Gatherings with Friends and Family: Not on file  . Attends Religious Services: Not on file  . Active Member of Clubs or Organizations: Not on file  . Attends Archivist Meetings: Not on file  . Marital Status: Not on file  Intimate Partner Violence:   . Fear of Current or Ex-Partner: Not on file  . Emotionally Abused: Not on file  . Physically Abused: Not on file  . Sexually Abused: Not on file      Review of Systems  Constitutional: Negative.  Negative for chills, fatigue and  unexpected weight change.  HENT: Negative.  Negative for congestion, rhinorrhea, sneezing and sore throat.   Eyes: Negative for redness.  Respiratory: Negative.  Negative for cough, chest tightness and shortness of breath.   Cardiovascular: Negative.  Negative for chest pain and palpitations.  Gastrointestinal: Negative.  Negative for abdominal pain, constipation, diarrhea, nausea and vomiting.  Endocrine: Negative.   Genitourinary: Negative.  Negative for dysuria and frequency.  Musculoskeletal: Positive for back pain and neck pain. Negative for arthralgias and joint swelling.  Skin: Negative.  Negative for rash.  Allergic/Immunologic: Negative.   Neurological: Negative.  Negative for tremors and numbness.  Hematological: Negative for adenopathy. Does not bruise/bleed easily.  Psychiatric/Behavioral: Negative.  Negative for behavioral problems, sleep disturbance and suicidal ideas. The patient is not nervous/anxious.     Vital Signs: BP 110/64   Pulse 96   Temp (!) 95.5 F (35.3 C)   Resp 16   Ht 5\' 11"  (1.803 m)   Wt 136 lb (61.7 kg)   SpO2 100%   BMI 18.97 kg/m    Physical Exam Vitals and nursing note reviewed.  Constitutional:      General: He is not in acute distress.    Appearance: He is well-developed. He is not diaphoretic.  HENT:     Head: Normocephalic and atraumatic.     Mouth/Throat:     Pharynx: No oropharyngeal exudate.  Eyes:     Pupils: Pupils are equal, round, and reactive to light.  Neck:     Thyroid: No thyromegaly.     Vascular: No JVD.     Trachea: No tracheal deviation.  Cardiovascular:     Rate and Rhythm: Normal rate and regular rhythm.     Heart sounds: Normal heart sounds. No murmur. No friction rub. No gallop.   Pulmonary:     Effort: Pulmonary effort is normal. No respiratory distress.     Breath sounds: Normal breath sounds. No wheezing or rales.  Chest:     Chest wall: No tenderness.  Abdominal:     Palpations: Abdomen is soft.      Tenderness: There is no abdominal tenderness. There is no guarding.  Musculoskeletal:        General: Normal range of motion.     Cervical back: Normal range of motion and neck supple.  Lymphadenopathy:     Cervical: No cervical adenopathy.  Skin:    General: Skin is warm and dry.  Neurological:     Mental Status: He is alert and oriented to person, place, and time.     Cranial Nerves: No cranial  nerve deficit.  Psychiatric:        Behavior: Behavior normal.        Thought Content: Thought content normal.        Judgment: Judgment normal.     Assessment/Plan: 1. Idiopathic hypotension Stable, continue midodrine as directed - midodrine (PROAMATINE) 2.5 MG tablet; Take 1 tablet (2.5 mg total) by mouth 3 (three) times daily with meals.  Dispense: 270 tablet; Refill: 0  2. Orthostatic hypotension intermittent symptoms remain, however patient does report improvement overall. - midodrine (PROAMATINE) 2.5 MG tablet; Take 1 tablet (2.5 mg total) by mouth 3 (three) times daily with meals.  Dispense: 270 tablet; Refill: 0  3. Dizziness Intermittent, appears to be related to orthostatic hypotension.   4. Gastroesophageal reflux disease without esophagitis Controlled, continue present therapy.  5. Prostate cancer ([place]) Continue to current management and surveilance.   6. Weight loss, unintentional Pt continues to have some small weight loss, 2 pounds since last visit.  Likely from Ca treatment, has decent appetite. Continue to monitor.    7. Neck pain Intermittent, likely arthritis. Instructed patient to use heating pad and otc meds as appropriate.   General Counseling: keaden witter understanding of the findings of todays visit and agrees with plan of treatment. I have discussed any further diagnostic evaluation that may be needed or ordered today. We also reviewed his medications today. he has been encouraged to call the office with any questions or concerns that should arise  related to todays visit.    No orders of the defined types were placed in this encounter.   Meds ordered this encounter  Medications  . midodrine (PROAMATINE) 2.5 MG tablet    Sig: Take 1 tablet (2.5 mg total) by mouth 3 (three) times daily with meals.    Dispense:  270 tablet    Refill:  0    Time spent: 25 Minutes   This patient was seen by Orson Gear AGNP-C in Collaboration with Dr Lavera Guise as a part of collaborative care agreement     Kendell Bane AGNP-C Internal medicine

## 2019-08-18 ENCOUNTER — Encounter: Payer: Self-pay | Admitting: Adult Health

## 2019-08-19 MED FILL — ABIRATERONE ACETATE 250 MG: 250 | 30 days supply | Qty: 90 | Fill #1

## 2019-08-20 MED FILL — predniSONE 5 MG TABS: 5 | 30 days supply | Qty: 60 | Fill #3

## 2019-08-28 ENCOUNTER — Other Ambulatory Visit: Payer: Self-pay | Admitting: *Deleted

## 2019-08-29 ENCOUNTER — Inpatient Hospital Stay (HOSPITAL_BASED_OUTPATIENT_CLINIC_OR_DEPARTMENT_OTHER): Payer: Medicare Other | Admitting: Oncology

## 2019-08-29 ENCOUNTER — Other Ambulatory Visit: Payer: Self-pay

## 2019-08-29 ENCOUNTER — Inpatient Hospital Stay: Payer: Medicare Other | Attending: Oncology

## 2019-08-29 ENCOUNTER — Encounter: Payer: Self-pay | Admitting: Oncology

## 2019-08-29 ENCOUNTER — Inpatient Hospital Stay: Payer: Medicare Other

## 2019-08-29 VITALS — BP 97/62 | HR 91 | Temp 97.2°F | Resp 18 | Wt 137.1 lb

## 2019-08-29 DIAGNOSIS — D631 Anemia in chronic kidney disease: Secondary | ICD-10-CM

## 2019-08-29 DIAGNOSIS — Z5111 Encounter for antineoplastic chemotherapy: Secondary | ICD-10-CM | POA: Insufficient documentation

## 2019-08-29 DIAGNOSIS — N1831 Chronic kidney disease, stage 3a: Secondary | ICD-10-CM | POA: Diagnosis not present

## 2019-08-29 DIAGNOSIS — D519 Vitamin B12 deficiency anemia, unspecified: Secondary | ICD-10-CM

## 2019-08-29 DIAGNOSIS — C61 Malignant neoplasm of prostate: Secondary | ICD-10-CM | POA: Insufficient documentation

## 2019-08-29 DIAGNOSIS — Z5181 Encounter for therapeutic drug level monitoring: Secondary | ICD-10-CM | POA: Diagnosis not present

## 2019-08-29 DIAGNOSIS — D510 Vitamin B12 deficiency anemia due to intrinsic factor deficiency: Secondary | ICD-10-CM | POA: Insufficient documentation

## 2019-08-29 DIAGNOSIS — Z79818 Long term (current) use of other agents affecting estrogen receptors and estrogen levels: Secondary | ICD-10-CM

## 2019-08-29 LAB — CBC WITH DIFFERENTIAL/PLATELET
Abs Immature Granulocytes: 0.01 10*3/uL (ref 0.00–0.07)
Basophils Absolute: 0 10*3/uL (ref 0.0–0.1)
Basophils Relative: 0 %
Eosinophils Absolute: 0 10*3/uL (ref 0.0–0.5)
Eosinophils Relative: 0 %
HCT: 31.2 % — ABNORMAL LOW (ref 39.0–52.0)
Hemoglobin: 10.4 g/dL — ABNORMAL LOW (ref 13.0–17.0)
Immature Granulocytes: 0 %
Lymphocytes Relative: 13 %
Lymphs Abs: 0.7 10*3/uL (ref 0.7–4.0)
MCH: 34.3 pg — ABNORMAL HIGH (ref 26.0–34.0)
MCHC: 33.3 g/dL (ref 30.0–36.0)
MCV: 103 fL — ABNORMAL HIGH (ref 80.0–100.0)
Monocytes Absolute: 0.4 10*3/uL (ref 0.1–1.0)
Monocytes Relative: 8 %
Neutro Abs: 4.1 10*3/uL (ref 1.7–7.7)
Neutrophils Relative %: 79 %
Platelets: 165 10*3/uL (ref 150–400)
RBC: 3.03 MIL/uL — ABNORMAL LOW (ref 4.22–5.81)
RDW: 13.4 % (ref 11.5–15.5)
WBC: 5.2 10*3/uL (ref 4.0–10.5)
nRBC: 0 % (ref 0.0–0.2)

## 2019-08-29 LAB — COMPREHENSIVE METABOLIC PANEL
ALT: 11 U/L (ref 0–44)
AST: 15 U/L (ref 15–41)
Albumin: 3.6 g/dL (ref 3.5–5.0)
Alkaline Phosphatase: 71 U/L (ref 38–126)
Anion gap: 8 (ref 5–15)
BUN: 28 mg/dL — ABNORMAL HIGH (ref 8–23)
CO2: 22 mmol/L (ref 22–32)
Calcium: 9 mg/dL (ref 8.9–10.3)
Chloride: 108 mmol/L (ref 98–111)
Creatinine, Ser: 1.5 mg/dL — ABNORMAL HIGH (ref 0.61–1.24)
GFR calc Af Amer: 47 mL/min — ABNORMAL LOW (ref >60)
GFR calc non Af Amer: 40 mL/min — ABNORMAL LOW (ref >60)
Glucose, Bld: 98 mg/dL (ref 70–99)
Potassium: 3.7 mmol/L (ref 3.5–5.1)
Sodium: 138 mmol/L (ref 135–145)
Total Bilirubin: 1.3 mg/dL — ABNORMAL HIGH (ref 0.3–1.2)
Total Protein: 6.3 g/dL — ABNORMAL LOW (ref 6.5–8.1)

## 2019-08-29 LAB — VITAMIN B12: Vitamin B-12: 239 pg/mL (ref 180–914)

## 2019-08-29 LAB — PSA: Prostatic Specific Antigen: 0.92 ng/mL (ref 0.00–4.00)

## 2019-08-29 MED ORDER — CYANOCOBALAMIN 1000 MCG/ML IJ SOLN
1000.0000 ug | Freq: Once | INTRAMUSCULAR | Status: AC
  Administered 2019-08-29: 1000 ug via INTRAMUSCULAR
  Filled 2019-08-29: qty 1

## 2019-08-29 NOTE — Progress Notes (Signed)
[facility] day:  08/29/2019    Chief Complaint: Charles Flores is an 86 y.o. male with prostate cancer and B12 deficiency who is seen for  3 month assessment.  Pertinent oncology history Patient previously follows up with Dr. Mike Gip.  He switched care to me on 09/05/2018. The patient is a 86 year old African American gentleman with prostate cancer and a rising PSA on Lupron.  Prostate cancer was discovered approximately 20 years secondary to an elevated PSA.  He underwent biopsy at [facility] (no report available). Initial PSA was 774.   I have personally reviewed the radiological images as listed and agreed with the findings in the report. Abdomen and pelvic CT on 01/05/2006 revealed an enlarged and heterogeneous prostate with mass effect on the base of the bladder.  There was no adenopathy.  Chest, abdomen, and pelvic CT on 03/15/2016 revealed moderate centrilobular emphysema.  There were mild lucent mottled appearance of the osseous structures (cannot exclude multiple myeloma).  There were scattered pulmonary nodules (4 mm or less in size).  There was a 2.7 cm low-attenuation left thyroid nodule.    Abdomen and pelvic CT on 12/07/2016 revealed no new or progressive findings.  There was no evidence of lymphadenopathy in the abdomen or pelvis. There was a stable appearance of heterogeneous/mottled bony mineralization.  Abdomen and pelvic CT on 11/27/2017 was stable with no evidence of adenopathy within the abdomen and pelvis.  There were no suspicious bone lesions.  There was a stable 3 mm RLL nodule.  Work-up on 03/17/2016 revealed no monoclonal protein.  SPEP revealed no monoclonal protein.  Kappa free light chains were 28.9, lambda free light chains 20.7 with a ratio of 1.40 (normal).  Immunoglobulins were normal (IgG 1391, IgA 282, IgM 29).  24 hour urine revealed no monoclonal protein. Thyroid ultrasound on 03/22/2016 revealed a  solitary 2.9 cm left lower pole nodule.  Thyroid biopsy on 04/14/2016 was benign (Bethesda category II) with abundant foamy macrophages and colloid. The cytologic findings were compatible with a cystic colloid nodule  Bone scan on 04/08/2014, 12/29/2014, 02/23/2016, 12/07/2016, and 11/27/2017 revealed no evidence of metastatic disease.  Patient has been on Lupron and Casodex.  He receives Lupron 30 mg every 4 months (last 12/06/2017).    PSA has been followed: 127.6 on 06/30/2014, 168.3 on 12/02/2014, 63.32 on 02/01/2015, 40.31 on 03/03/2015, 29.12 on 04/08/2015, 11.28 on 07/14/2015, 11.02 on 08/17/2015, 10.35 on 11/15/2015, 14.37 on 02/15/2016, 12.46 on 06/06/2016, 13.5 on 06/23/2016, 16.63 on 09/05/2016, 20.77 on 11/07/2016, 16.41 on 12/11/2016, 26.69 on 02/27/2017, 25.67 on 04/02/2017, 19.16 on 05/10/2017, 18.94 on 07/02/2017, 28.28 on 10/01/2017, 30.23 on 12/06/2017, and 34.56 on 03/14/2018.  He has a normocytic anemia. He was diagnosed with B12 deficiency on 12/24/2014. B12 was 169 (low).  He began B12 supplimentation on 02/01/2015 (last 11/15/2017).  Parietal cell antibody was positive on 07/02/2017.  Folate was 11 on 12/24/2014 and 12 on 06/06/2016.  Normal studies included a CMP, ferritin, iron studies, folate, and TSH. TSH and free T4 were normal on 04/02/2017.   # Limited abdominal ultrasound was done on 03/14/2018 revealed a 21.8 x 8.3 x 10.2 cm fatty mass with internal calcifications.  Findings felt to correspond to the CT findings on 11/2017, and not significantly changed from 12/2015 scan.  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  Neoplasm felt to be unlikely.  INTERVAL HISTORY Charles Flores is a 86 y.o.  male who has above history reviewed by me today presents for follow up visit for management of prostate cancer. Patient was accompanied by her nephew. He reports feeling well.  He has no new complaints.   He takes Zytiga 750 mg daily as well as  prednisone 5 mg twice daily. Tolerates well. Pressure has been stable.   Patient is on midodrine for chronic hypotension.  Denies any dizziness.   Continue to have chronic bilateral lower extremity ankle edema, ultrasound showed negative for DVT.  Past Medical History:  Diagnosis Date  . Anemia 12/24/2014  . Arthritis   . B12 deficiency anemia 01/01/2015  . Cataract   . Coronary artery disease   . GERD (gastroesophageal reflux disease)   . Hyperlipidemia   . Hyperlipidemia   . Prostate cancer ([place])   . Renal insufficiency 04/15/2017  . Thyroid nodule 03/14/2016    Past Surgical History:  Procedure Laterality Date  . PROSTATE BIOPSY      Family History  Problem Relation Age of Onset  . Diabetes Sister   . Stroke Sister   . Gastric cancer Sister   . Cancer Mother   . Breast cancer Mother   . Kidney disease Father    Social History   Socioeconomic History  . Marital status: Single    Spouse name: Not on file  . Number of children: Not on file  . Years of education: Not on file  . Highest education level: Not on file  Occupational History  . Not on file  Tobacco Use  . Smoking status: Former Smoker    Packs/day: 0.50    Years: 15.00    Pack years: 7.50    Types: Cigarettes  . Smokeless tobacco: Never Used  . Tobacco comment: quit 40 years  Substance and Sexual Activity  . Alcohol use: No    Alcohol/week: 0.0 standard drinks  . Drug use: No  . Sexual activity: Yes    Partners: Male  Other Topics Concern  . Not on file  Social History Narrative  . Not on file   Social Determinants of Health   Financial Resource Strain:   . Difficulty of Paying Living Expenses: Not on file  Food Insecurity:   . Worried About Charity fundraiser in the Last Year: Not on file  . Ran Out of Food in the Last Year: Not on file  Transportation Needs:   . Lack of Transportation (Medical): Not on file  . Lack of Transportation (Non-Medical): Not on file  Physical Activity:   .  Days of Exercise per Week: Not on file  . Minutes of Exercise per Session: Not on file  Stress:   . Feeling of Stress : Not on file  Social Connections:   . Frequency of Communication with Friends and Family: Not on file  . Frequency of Social Gatherings with Friends and Family: Not on file  . Attends Religious Services: Not on file  . Active Member of Clubs or Organizations: Not on file  . Attends Archivist Meetings: Not on file  . Marital Status: Not on file  Intimate Partner Violence:   . Fear of Current or Ex-Partner: Not on file  . Emotionally Abused: Not on file  . Physically Abused: Not on file  . Sexually Abused: Not on file     Allergies: No Known Allergies  Current Outpatient Medications  Medication Sig Dispense Refill  . abiraterone acetate (ZYTIGA) 250 MG tablet TAKE 3 TABLETS BY MOUTH DAILY.  TAKE ON AN EMPTY STOMACH 1 HOUR BEFORE OR 2 HOURS AFTER A MEAL 90 tablet 2  . midodrine (PROAMATINE) 2.5 MG tablet Take 1 tablet (2.5 mg total) by mouth 3 (three) times daily with meals. 270 tablet 0  . omeprazole (PRILOSEC) 20 MG capsule TAKE 1 CAPSULE BY MOUTH EVERY DAY 90 capsule 0  . predniSONE (DELTASONE) 5 MG tablet TAKE 1 TABLET (5 MG TOTAL) BY MOUTH 2 (TWO) TIMES DAILY WITH A MEAL. 60 tablet 3   No current facility-administered medications for this visit.   Review of Systems  Constitutional: Positive for fatigue. Negative for appetite change, chills, fever and unexpected weight change.  HENT:   Negative for hearing loss and voice change.   Eyes: Negative for eye problems and icterus.  Respiratory: Negative for chest tightness, cough and shortness of breath.   Cardiovascular: Positive for leg swelling. Negative for chest pain.  Gastrointestinal: Negative for abdominal distention and abdominal pain.  Endocrine: Negative for hot flashes.  Genitourinary: Negative for difficulty urinating, dysuria and frequency.   Musculoskeletal: Negative for arthralgias.  Skin:  Negative for itching and rash.  Neurological: Negative for light-headedness and numbness.  Hematological: Negative for adenopathy. Does not bruise/bleed easily.  Psychiatric/Behavioral: Negative for confusion.    Performance status (ECOG): 1 - Symptomatic but completely ambulatory  Vital Signs BP 97/62 (BP Location: Right Arm)   Pulse 91   Temp (!) 97.2 F (36.2 C)   Resp 18   Wt 137 lb 1.6 oz (62.2 kg)   BMI 19.12 kg/m   Physical Exam  Constitutional: He is oriented to person, place, and time. He appears healthy. No distress.  Thin, frail appearance  HENT:  Head: Normocephalic and atraumatic.  Nose: Nose normal.  Mouth/Throat: Oropharynx is clear and moist and mucous membranes are normal. He has dentures. No oropharyngeal exudate.  Eyes: Pupils are equal, round, and reactive to light. EOM are normal. No scleral icterus.  Neck: No tracheal deviation present. No thyromegaly present.  Cardiovascular: Normal rate, regular rhythm, normal heart sounds and intact distal pulses. Exam reveals no gallop and no friction rub.  No murmur heard. Pulmonary/Chest: Effort normal and breath sounds normal. No respiratory distress. He has no wheezes. He has no rales. He exhibits no tenderness.  Abdominal: Soft. Bowel sounds are normal. He exhibits no distension. Mass: Large right flank mass: Previously ultrasound consistent with a lipomatous growth fat necrosis and resultant calcification. There is no abdominal tenderness.  Musculoskeletal:        General: No tenderness or edema. Normal range of motion.     Cervical back: Normal range of motion and neck supple.  Lymphadenopathy:    He has no cervical adenopathy.    He has no axillary adenopathy.       Right: No supraclavicular adenopathy present.       Left: No supraclavicular adenopathy present.  Neurological: He is alert and oriented to person, place, and time. No cranial nerve deficit. He exhibits normal muscle tone. Coordination normal.   Skin: Skin is warm and dry. No rash noted. He is not diaphoretic. No erythema.  Psychiatric: Mood and affect normal.  Nursing note and vitals reviewed.  CBC    Component Value Date/Time   WBC 5.2 08/29/2019 0920   RBC 3.03 (L) 08/29/2019 0920   HGB 10.4 (L) 08/29/2019 0920   HCT 31.2 (L) 08/29/2019 0920   PLT 165 08/29/2019 0920   MCV 103.0 (H) 08/29/2019 0920   MCH 34.3 (H) 08/29/2019 0920  MCHC 33.3 08/29/2019 0920   RDW 13.4 08/29/2019 0920   LYMPHSABS 0.7 08/29/2019 0920   MONOABS 0.4 08/29/2019 0920   EOSABS 0.0 08/29/2019 0920   BASOSABS 0.0 08/29/2019 0920   CMP Latest Ref Rng & Units 08/29/2019 05/29/2019 04/17/2019  Glucose 70 - 99 mg/dL 98 90 95  BUN 8 - 23 mg/dL 28(H) 27(H) 21  Creatinine 0.61 - 1.24 mg/dL 1.50(H) 1.45(H) 1.34(H)  Sodium 135 - 145 mmol/L 138 139 140  Potassium 3.5 - 5.1 mmol/L 3.7 3.6 4.3  Chloride 98 - 111 mmol/L 108 108 107  CO2 22 - 32 mmol/L '22 24 26  ' Calcium 8.9 - 10.3 mg/dL 9.0 9.2 9.3  Total Protein 6.5 - 8.1 g/dL 6.3(L) 6.8 6.8  Total Bilirubin 0.3 - 1.2 mg/dL 1.3(H) 1.0 1.0  Alkaline Phos 38 - 126 U/L 71 67 71  AST 15 - 41 U/L '15 15 15  ' ALT 0 - 44 U/L '11 11 12    ' Assessment and Plan 86 y.o. male present for management of prostate cancer and vitamin B12 deficiency. 1. Prostate cancer ([place])   2. Anemia due to vitamin B12 deficiency, unspecified B12 deficiency type   3. Encounter for antineoplastic chemotherapy   4. Encounter for monitoring Lupron therapy   5. 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  Today's PSA 0.92. Continue current regimen. Continue omeprazole for GI prophylaxis.  #Chronic lower extremity edema, likely vein insufficiency Ultrasound duplex bilateral lower extremity negative for DVT. Recommend patient to use compression stocking.  #Androgen deprivation therapy,  Lupron 30 mg will be given around 09/18/2019. #Pernicious anemia Vitamin B12 deficiency, vitamin B12 level is at 239. We will proceed with vitamin B12 injection monthly.  He will receive extra vitamin B12 injection around 09/18/2019.  #Anemia  hemoglobin has decreased to 10.4.  Multifactorial..  Secondary to CKD, vitamin B12 deficiency. Refer to palliative service to establish care   Follow-up in 4 months. Orders Placed This Encounter  Procedures  . Ambulatory Referral to Palliative Care    Referral Priority:   Routine    Referral Type:   Consultation    Referral Reason:   Advance Care Planning    Referred to Provider:   Borders, Kirt Boys, NP    Number of Visits Requested:   1    We spent sufficient time to discuss many aspect of care, questions were answered to patient's satisfaction.   Earlie Server, MD, PhD 08/29/2019

## 2019-08-29 NOTE — Progress Notes (Signed)
Patient here for follow up. No concerns voiced.  °

## 2019-08-29 NOTE — Addendum Note (Signed)
Addended by: Earlie Server on: 08/29/2019 11:33 PM   Modules accepted: Orders

## 2019-08-30 MED ORDER — OMEPRAZOLE 20 MG PO CPDR: DELAYED_RELEASE_CAPSULE | ORAL | 1 refills | Status: DC

## 2019-09-16 ENCOUNTER — Other Ambulatory Visit: Payer: Self-pay | Admitting: Oncology

## 2019-09-16 DIAGNOSIS — C61 Malignant neoplasm of prostate: Secondary | ICD-10-CM

## 2019-09-16 MED FILL — predniSONE 5 MG TABS: 5 | 30 days supply | Qty: 60 | Fill #0

## 2019-09-18 ENCOUNTER — Inpatient Hospital Stay (HOSPITAL_BASED_OUTPATIENT_CLINIC_OR_DEPARTMENT_OTHER): Payer: Medicare Other | Admitting: Hospice and Palliative Medicine

## 2019-09-18 ENCOUNTER — Other Ambulatory Visit: Payer: Self-pay

## 2019-09-18 ENCOUNTER — Encounter: Payer: Self-pay | Admitting: Hospice and Palliative Medicine

## 2019-09-18 ENCOUNTER — Other Ambulatory Visit: Payer: Medicare Other

## 2019-09-18 ENCOUNTER — Inpatient Hospital Stay: Payer: Medicare Other

## 2019-09-18 VITALS — BP 105/63 | HR 92

## 2019-09-18 VITALS — BP 121/79 | HR 88 | Temp 97.4°F | Resp 16 | Wt 143.6 lb

## 2019-09-18 DIAGNOSIS — Z5111 Encounter for antineoplastic chemotherapy: Secondary | ICD-10-CM | POA: Diagnosis not present

## 2019-09-18 DIAGNOSIS — R1310 Dysphagia, unspecified: Secondary | ICD-10-CM

## 2019-09-18 DIAGNOSIS — Z515 Encounter for palliative care: Secondary | ICD-10-CM | POA: Diagnosis not present

## 2019-09-18 DIAGNOSIS — C61 Malignant neoplasm of prostate: Secondary | ICD-10-CM

## 2019-09-18 DIAGNOSIS — D519 Vitamin B12 deficiency anemia, unspecified: Secondary | ICD-10-CM

## 2019-09-18 DIAGNOSIS — D510 Vitamin B12 deficiency anemia due to intrinsic factor deficiency: Secondary | ICD-10-CM | POA: Diagnosis not present

## 2019-09-18 MED ORDER — CYANOCOBALAMIN 1000 MCG/ML IJ SOLN
1000.0000 ug | Freq: Once | INTRAMUSCULAR | Status: AC
  Administered 2019-09-18: 1000 ug via INTRAMUSCULAR
  Filled 2019-09-18: qty 1

## 2019-09-18 MED ORDER — LEUPROLIDE ACETATE (4 MONTH) 30 MG IM KIT
30.0000 mg | PACK | Freq: Once | INTRAMUSCULAR | Status: AC
  Administered 2019-09-18: 30 mg via INTRAMUSCULAR
  Filled 2019-09-18: qty 30

## 2019-09-18 NOTE — Progress Notes (Signed)
[place]  Telephone:([phone] [phone]   Name: Charles Flores Date: 09/18/2019 MRN: [id number]  DOB: Sep 29, 1926  Patient Care Team: Ronnell Freshwater, NP as PCP - General (Family Medicine) Collier Flowers, MD as Referring Physician (Urology)    REASON FOR CONSULTATION: Charles Flores is a 86 y.o. 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  Patient was referred to palliative care by medical oncology to establish goals and provide ongoing support.  SOCIAL HISTORY:     reports that he has quit smoking. His smoking use included cigarettes. He has a 7.50 pack-year smoking history. He has never used smokeless tobacco. He reports that he does not drink alcohol or use drugs.   Patient is unmarried.  He has no children.  He lives at home with his nephew.  He also has a niece who is involved in his care.  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  ADVANCE DIRECTIVES:  Does not have  CODE STATUS: DNR/DNI (MOST Form completed on 09/18/2019)  PAST MEDICAL HISTORY: Past Medical History:  Diagnosis Date  . Anemia 12/24/2014  . Arthritis   . B12 deficiency anemia 01/01/2015  . Cataract   . Coronary artery disease   . GERD (gastroesophageal reflux disease)   . Hyperlipidemia   . Hyperlipidemia   . Prostate cancer ([place])   . Renal insufficiency 04/15/2017  . Thyroid nodule 03/14/2016    PAST SURGICAL HISTORY:  Past Surgical History:  Procedure Laterality Date  . PROSTATE BIOPSY      HEMATOLOGY/ONCOLOGY HISTORY:  Oncology History   No history exists.    ALLERGIES:  has No Known Allergies.  MEDICATIONS:  Current Outpatient Medications  Medication Sig Dispense Refill  . abiraterone acetate (ZYTIGA) 250 MG tablet TAKE 3 TABLETS BY MOUTH DAILY. TAKE ON AN EMPTY STOMACH 1 HOUR BEFORE OR 2 HOURS AFTER A MEAL 90 tablet 2  .  midodrine (PROAMATINE) 2.5 MG tablet Take 1 tablet (2.5 mg total) by mouth 3 (three) times daily with meals. 270 tablet 0  . omeprazole (PRILOSEC) 20 MG capsule TAKE 1 CAPSULE BY MOUTH EVERY DAY 90 capsule 1  . predniSONE (DELTASONE) 5 MG tablet TAKE 1 TABLET (5 MG TOTAL) BY MOUTH 2 (TWO) TIMES DAILY WITH A MEAL. 60 tablet 3   No current facility-administered medications for this visit.    VITAL SIGNS: BP 121/79 (BP Location: Right Arm, Patient Position: Sitting)   Pulse 88   Temp (!) 97.4 F (36.3 C) (Tympanic)   Resp 16   Wt 143 lb 9.6 oz (65.1 kg)   SpO2 100%   BMI 20.03 kg/m  Filed Weights   09/18/19 0931  Weight: 143 lb 9.6 oz (65.1 kg)    Estimated body mass index is 20.03 kg/m as calculated from the following:   Height as of 08/12/19: _0  (1.803 m).   Weight as of this encounter: 143 lb 9.6 oz (65.1 kg).  LABS: CBC:    Component Value Date/Time   WBC 5.2 08/29/2019 0920   HGB 10.4 (L) 08/29/2019 0920   HCT 31.2 (L) 08/29/2019 0920   PLT 165 08/29/2019 0920   MCV 103.0 (H) 08/29/2019 0920   NEUTROABS 4.1 08/29/2019 0920   LYMPHSABS 0.7 08/29/2019 0920   MONOABS 0.4 08/29/2019 0920   EOSABS 0.0 08/29/2019 0920   BASOSABS 0.0 08/29/2019 0920   Comprehensive Metabolic Panel:    Component  Value Date/Time   NA 138 08/29/2019 0920   K 3.7 08/29/2019 0920   CL 108 08/29/2019 0920   CO2 22 08/29/2019 0920   BUN 28 (H) 08/29/2019 0920   CREATININE 1.50 (H) 08/29/2019 0920   GLUCOSE 98 08/29/2019 0920   CALCIUM 9.0 08/29/2019 0920   AST 15 08/29/2019 0920   ALT 11 08/29/2019 0920   ALKPHOS 71 08/29/2019 0920   BILITOT 1.3 (H) 08/29/2019 0920   PROT 6.3 (L) 08/29/2019 0920   ALBUMIN 3.6 08/29/2019 0920    RADIOGRAPHIC STUDIES: No results found.  PERFORMANCE STATUS (ECOG) : 1 - Symptomatic but completely ambulatory  Review of Systems Unless otherwise noted, a complete review of systems is negative.  Physical Exam General: NAD, frail appearing, thin  Pulmonary: Unlabored Extremities: no edema, no joint deformities Skin: no rashes Neurological: Weakness but otherwise nonfocal  IMPRESSION: I met with patient and his nephew today in the clinic.  Introduced palliative care services and attempt to establish therapeutic rapport.  Patient reports that overall he is doing well.  He is frail reports that he is fully independent with all of his own care at home.  His nephew ambulates with him outside the home as patient tends to become unsteady.  Despite that, he does not use assistive devices to ambulate.  Symptomatically, he does endorse about a month of feeling like harder foods are getting "stuck" in his distal esophagus.  He does have a history of reflux but denies any recent problems with burning or pain.  Patient denies having previously had a GI work-up or having seen a gastroenterologist.  Patient does request referral to GI for evaluation of symptoms.  Symptoms concerning for presbyesophagus versus esophageal stricture.   Patient denies other distressing symptoms.  Patient does not have any established ACP documents.  He says he would want his nieces and nephews involved in his decision-making if necessary.  I did review with patient ACP documents today.  We also discussed CODE STATUS.  Patient says that he would not want to be resuscitated nor have his life prolonged artificially machines.  He would be okay with short-term hospitalization if necessary to treat the treatable.   I completed a MOST form today. The patient and family outlined their wishes for the following treatment decisions:  Cardiopulmonary Resuscitation: Do Not Attempt Resuscitation (DNR/No CPR)  Medical Interventions: Limited Additional Interventions: Use medical treatment, IV fluids and cardiac monitoring as indicated, DO NOT USE intubation or mechanical ventilation. May consider use of less invasive airway support such as BiPAP or CPAP. Also provide comfort measures.  Transfer to the hospital if indicated. Avoid intensive care.   Antibiotics: Antibiotics if indicated  IV Fluids: IV fluids if indicated  Feeding Tube: No feeding tube    PLAN: -Continue current scope of treatment -Referral to GI -DNR/DNI -MOST Form completed -ACP documents reviewed -RTC in 3 months  Case and plan discussed with Dr. Tasia Catchings   Patient expressed understanding and was in agreement with this plan. He also understands that He can call the clinic at any time with any questions, concerns, or complaints.     Time Total: 30 minutes  Visit consisted of counseling and education dealing with the complex and emotionally intense issues of symptom management and palliative care in the setting of serious and potentially life-threatening illness.Greater than 50%  of this time was spent counseling and coordinating care related to the above assessment and plan.  Signed by: Altha Harm, PhD, NP-C

## 2019-09-22 ENCOUNTER — Telehealth: Payer: Self-pay | Admitting: Pharmacy Technician

## 2019-09-22 MED FILL — ABIRATERONE ACETATE 250 MG: 250 | 30 days supply | Qty: 90 | Fill #2

## 2019-09-22 NOTE — Telephone Encounter (Signed)
Oral Oncology Patient Advocate Encounter  Was successful in securing patient a $8,000 grant from [employer] to provide copayment coverage for Zytiga.  This will keep the out of pocket expense at $0.     [employer] ID: [id number]  I have spoken with the patient.   The billing information is as follows and has been shared with [facility].    RxBin: [id number] PCN: [id number] Member ID: [id number] Group ID: [id number] Dates of Eligibility: 08/23/19 through 08/21/2020  Fund:  [employer] [facility] Phone [phone] Fax [phone] 09/22/2019 12:03 PM

## 2019-10-13 ENCOUNTER — Other Ambulatory Visit: Payer: Self-pay | Admitting: Oncology

## 2019-10-13 DIAGNOSIS — C61 Malignant neoplasm of prostate: Secondary | ICD-10-CM

## 2019-10-15 ENCOUNTER — Ambulatory Visit: Payer: Medicare Other | Admitting: Gastroenterology

## 2019-10-15 ENCOUNTER — Other Ambulatory Visit: Payer: Self-pay

## 2019-10-15 VITALS — BP 123/79 | HR 78 | Temp 98.2°F | Ht 71.0 in | Wt 150.8 lb

## 2019-10-15 DIAGNOSIS — R1310 Dysphagia, unspecified: Secondary | ICD-10-CM

## 2019-10-15 MED FILL — predniSONE 5 MG TABS: 5 | 30 days supply | Qty: 60 | Fill #1

## 2019-10-15 MED FILL — ABIRATERONE ACETATE 250 MG: 250 | 30 days supply | Qty: 90 | Fill #0

## 2019-10-15 NOTE — Progress Notes (Signed)
Jonathon Bellows MD, MRCP(U.K) [address]  [place]  [place], [place] [address]  Main: [phone]  Fax: [phone]   Gastroenterology Consultation  Referring Provider:     Earlie Server, MD Primary Care Physician:  Ronnell Freshwater, NP Primary Gastroenterologist:  Dr. Jonathon Bellows  Reason for Consultation:     Dysphagia        HPI:   Charles Flores is a 86 y.o. 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  And anemia.  Established with oncology and palliative care.  She is here to see me for dysphagia.  08/29/2019 hemoglobin 10.4 g, B12 low at 239.  Creatinine 1.5.  CT scan of the chest in 2017 demonstrated moderate centrilobular emphysema.  Pulmonary nodules.Last echocardiogram in [id number] shows diastolic dysfunction mild to moderate tricuspid regurgitation.  Borderline pulmonary hypertension.  He states that he has had difficulty swallowing solids more than liquids for the past 1 year which has not really progressed.  He is points to his neck and says that is where the food get stuck.  He states that sometimes the food goes the wrong way and makes him cough.  Denies any other complaint denies any weight loss.  Past Medical History:  Diagnosis Date  . Anemia 12/24/2014  . Arthritis   . B12 deficiency anemia 01/01/2015  . Cataract   . Coronary artery disease   . GERD (gastroesophageal reflux disease)   . Hyperlipidemia   . Hyperlipidemia   . Prostate cancer ([place])   . Renal insufficiency 04/15/2017  . Thyroid nodule 03/14/2016    Past Surgical History:  Procedure Laterality Date  . PROSTATE BIOPSY      Prior to Admission medications   Medication Sig Start Date End Date Taking? Authorizing Provider  abiraterone acetate (ZYTIGA) 250 MG tablet TAKE 3 TABLETS BY MOUTH DAILY. TAKE ON AN EMPTY STOMACH 1 HOUR BEFORE OR 2 HOURS AFTER A MEAL 10/14/19   Earlie Server, MD  midodrine (PROAMATINE) 2.5 MG tablet Take 1  tablet (2.5 mg total) by mouth 3 (three) times daily with meals. 08/12/19   Kendell Bane, NP  omeprazole (PRILOSEC) 20 MG capsule TAKE 1 CAPSULE BY MOUTH EVERY DAY 08/30/19   Earlie Server, MD  predniSONE (DELTASONE) 5 MG tablet TAKE 1 TABLET (5 MG TOTAL) BY MOUTH 2 (TWO) TIMES DAILY WITH A MEAL. 09/16/19   Earlie Server, MD    Family History  Problem Relation Age of Onset  . Diabetes Sister   . Stroke Sister   . Gastric cancer Sister   . Cancer Mother   . Breast cancer Mother   . Kidney disease Father      Social History   Tobacco Use  . Smoking status: Former Smoker    Packs/day: 0.50    Years: 15.00    Pack years: 7.50    Types: Cigarettes  . Smokeless tobacco: Never Used  . Tobacco comment: quit 40 years  Substance Use Topics  . Alcohol use: No    Alcohol/week: 0.0 standard drinks  . Drug use: No    Allergies as of 10/15/2019  . (No Known Allergies)    Review of Systems:    All systems reviewed and negative except where noted in HPI.   Physical Exam:  There were no vitals taken for this visit. No LMP for male patient. Psych:  Alert and cooperative. Normal mood and affect. General:   Alert,  Well-developed, well-nourished,  pleasant and cooperative in NAD Head:  Normocephalic and atraumatic. Eyes:  Sclera clear, no icterus.   Conjunctiva pink. Ears:  Normal auditory acuity. Lungs:  Respirations even and unlabored.  Clear throughout to auscultation.   No wheezes, crackles, or rhonchi. No acute distress. Heart:  Regular rate and rhythm; no murmurs, clicks, rubs, or gallops. Abdomen:  Normal bowel sounds.  No bruits.  Soft, non-tender and non-distended without masses, hepatosplenomegaly or hernias noted.  No guarding or rebound tenderness.    Neurologic:  Alert and oriented x3;  grossly normal neurologically. Psych:  Alert and cooperative. Normal mood and affect.  Imaging Studies: No results found.  Assessment and Plan:   Montell Winget is a 86 y.o. y/o male has been  referred for dysphagia.  He has history of prostate cancer.  Prior echocardiogram demonstrates features of pulmonary hypertension and moderate tricuspid regurgitation.  He has a history of emphysema based on his last CT scan.  In terms of evaluation of dysphagia considering his risk factors for anesthesia and his age would probably be safe to first evaluate nonendoscopically in the form of a barium swallow with a tablet to determine if there is any gross obstruction in the esophageal lumen and also obtain a modified barium swallow to rule out transfer dysphagia based on his history of coughing associated with swallowing..  Based on the results we can discuss if endoscopic evaluation is warranted after.  Plan 1.  Barium swallow with tablet. 2.  Modified barium swallow to rule out transfer dysphagia.  Follow up in 6 to 8 weeks telephone or video visit  Dr Jonathon Bellows MD,MRCP(U.K)

## 2019-10-17 ENCOUNTER — Inpatient Hospital Stay: Payer: Medicare Other

## 2019-10-21 ENCOUNTER — Other Ambulatory Visit: Payer: Self-pay

## 2019-10-21 ENCOUNTER — Inpatient Hospital Stay: Payer: Medicare Other | Attending: Oncology

## 2019-10-21 DIAGNOSIS — D519 Vitamin B12 deficiency anemia, unspecified: Secondary | ICD-10-CM

## 2019-10-21 DIAGNOSIS — E538 Deficiency of other specified B group vitamins: Secondary | ICD-10-CM | POA: Diagnosis not present

## 2019-10-21 MED ORDER — CYANOCOBALAMIN 1000 MCG/ML IJ SOLN
1000.0000 ug | Freq: Once | INTRAMUSCULAR | Status: AC
  Administered 2019-10-21: 1000 ug via INTRAMUSCULAR
  Filled 2019-10-21: qty 1

## 2019-10-23 ENCOUNTER — Ambulatory Visit: Payer: Medicare Other

## 2019-10-24 ENCOUNTER — Ambulatory Visit
Admission: RE | Admit: 2019-10-24 | Discharge: 2019-10-24 | Disposition: A | Discharge status: Home / Self Care | Payer: Medicare Other | Source: Ambulatory Visit | Attending: Gastroenterology | Admitting: Gastroenterology

## 2019-10-24 ENCOUNTER — Other Ambulatory Visit: Payer: Self-pay

## 2019-10-24 DIAGNOSIS — R1310 Dysphagia, unspecified: Secondary | ICD-10-CM | POA: Insufficient documentation

## 2019-10-29 ENCOUNTER — Other Ambulatory Visit: Payer: Self-pay

## 2019-10-29 ENCOUNTER — Ambulatory Visit
Admission: RE | Admit: 2019-10-29 | Discharge: 2019-10-29 | Disposition: A | Discharge status: Home / Self Care | Payer: Medicare Other | Source: Ambulatory Visit | Attending: Gastroenterology | Admitting: Gastroenterology

## 2019-10-29 DIAGNOSIS — R1312 Dysphagia, oropharyngeal phase: Secondary | ICD-10-CM | POA: Insufficient documentation

## 2019-10-29 DIAGNOSIS — R1310 Dysphagia, unspecified: Secondary | ICD-10-CM | POA: Diagnosis not present

## 2019-10-29 NOTE — Therapy (Signed)
Charles Flores, [place], [address] Phone: [phone]   Fax:     Modified Barium Swallow  Patient Details  Name: Charles Flores MRN: [id number] Date of Birth: 07/15/1927 No data recorded  Encounter Date: 10/29/2019  End of Session - 10/29/19 1332    Visit Number  1    Number of Visits  1    Date for SLP Re-Evaluation  10/29/19    SLP Start Time  27    SLP Stop Time   1315    SLP Time Calculation (min)  45 min    Activity Tolerance  Patient tolerated treatment well       Past Medical History:  Diagnosis Date  . Anemia 12/24/2014  . Arthritis   . B12 deficiency anemia 01/01/2015  . Cataract   . Coronary artery disease   . GERD (gastroesophageal reflux disease)   . Hyperlipidemia   . Hyperlipidemia   . Prostate cancer ([place])   . Renal insufficiency 04/15/2017  . Thyroid nodule 03/14/2016    Past Surgical History:  Procedure Laterality Date  . PROSTATE BIOPSY      There were no vitals filed for this visit.     Subjective: Patient behavior: (alertness, ability to follow instructions, etc.):  The patient is alert and able to follow dierections.  Chief complaint: Patient reports some coughing with swallowing   Objective:  Radiological Procedure: A videoflouroscopic evaluation of oral-preparatory, reflex initiation, and pharyngeal phases of the swallow was performed; as well as a screening of the upper esophageal phase.  I. POSTURE: Upright in MBS chair  II. VIEW: Lateral  III. COMPENSATORY STRATEGIES: Voluntary hard swallow aids pharyngeal clearance  IV. BOLUSES ADMINISTERED:   Thin Liquid: 1 small, 2 large consecutive   Nectar-thick Liquid: 1 large   Honey-thick Liquid: DNT   Puree: 1 teaspoon, 1 heaping teaspoon   Mechanical Soft: 1/4 graham cracker in applesauce  V. RESULTS OF EVALUATION: A. ORAL PREPARATORY PHASE: (The lips, tongue, and velum are observed for strength and  coordination)       **Overall Severity Rating: within normal limits   B. SWALLOW INITIATION/REFLEX: (The reflex is normal if "triggered" by the time the bolus reached the base of the tongue)  **Overall Severity Rating: Mild-moderate; triggers while falling from the valleclae to the pyriform sinuses  C. PHARYNGEAL PHASE: (Pharyngeal function is normal if the bolus shows rapid, smooth, and continuous transit through the pharynx and there is no pharyngeal residue after the swallow)  **Overall Severity Rating: Mild; reduced epiglottic inversion, reduced hyolaryngeal excursion, mild pharyngeal residue  D. LARYNGEAL PENETRATION: (Material entering into the laryngeal inlet/vestibule but not aspirated) X1 transient   E. ASPIRATION: None  F. ESOPHAGEAL PHASE: (Screening of the upper esophagus) In the cervical esophagus there is a finger-like protrusion along the posterior wall during swallow (does not impede flow of boluses) consistent with prominent cricopharyngeus.  ASSESSMENT: This 86 year old man is presenting with mild oropharyngeal dysphagia characterized by delayed pharyngeal swallow initiation, reduced epiglottic inversion, reduced hyolaryngeal excursion, mild pharyngeal residue, and 1 instance of transient laryngeal penetration.  Oral control of the bolus including oral hold, rotary mastication, and anterior to posterior transfer is within functional limits.   There is no observed tracheal aspiration.  The patient is not at risk for prandial aspiration.  A voluntary hard swallow is effective in clearing most of the pharyngeal residue.  Delayed pharyngeal swallow initiation is consistent with effects of laryngopharyngeal reflux (inflammation, edema,  and resultant decreased sensation of the larynx and pharynx).  In the cervical esophagus there is a finger-like protrusion along the posterior wall during swallow (does not impede flow of boluses) consistent with prominent cricopharyngeus.  The patient was  counseled to "swallow hard" if he feels pharyngeal residue.  PLAN/RECOMMENDATIONS:   A. Diet: Regular, soften and moisten as needed for easier swallowing   B. Swallowing Precautions: Hard voluntary swallow to aid pharyngeal clearance   C. Recommended consultation to: follow up with MDs as recommended   D. Therapy recommendations: speech therapy is not indicated   E. Results and recommendations were discussed with the patient immediately following the study and the final report routed to the referring MD.    Oropharyngeal dysphagia - Plan: DG SWALLOW FUNC OP MEDICARE SPEECH PATH, DG SWALLOW FUNC OP MEDICARE SPEECH PATH        Problem List Patient Active Problem List   Diagnosis Date Noted  . 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 Near syncope 01/18/2018  . SOB (shortness of breath) 01/18/2018  . Pulmonary nodule 12/06/2017  . Goals of care, counseling/discussion 10/01/2017  . Renal insufficiency 04/15/2017  . Thyroid nodule 03/14/2016  . Weight loss 02/24/2016  . B12 deficiency anemia 01/01/2015  . Prostate cancer ([place]) 12/24/2014  . Anemia 12/24/2014   Leroy Sea, MS/CCC- SLP  Lou Miner 10/29/2019, 1:33 PM  [facility] [place], [place], [address] Phone: [phone]   Fax:     Name: Charles Flores MRN: [id number] Date of Birth: 1927/05/30

## 2019-11-10 ENCOUNTER — Telehealth: Payer: Self-pay

## 2019-11-10 ENCOUNTER — Encounter: Payer: Self-pay | Admitting: Gastroenterology

## 2019-11-10 NOTE — Telephone Encounter (Signed)
Confirmed appointment on 11/12/2019 and screened for covid. klh °

## 2019-11-11 ENCOUNTER — Telehealth: Payer: Self-pay

## 2019-11-11 MED FILL — predniSONE 5 MG TABS: 5 | 30 days supply | Qty: 60 | Fill #2

## 2019-11-11 MED FILL — ABIRATERONE ACETATE 250 MG: 250 | 30 days supply | Qty: 90 | Fill #1

## 2019-11-11 NOTE — Telephone Encounter (Signed)
Spoke with pt's nephew, Lanny Hurst, and informed him of pt's results and the recommendations. He understands and agrees.

## 2019-11-11 NOTE — Telephone Encounter (Signed)
-----   Message from Jonathon Bellows, MD sent at 11/10/2019 11:40 AM EDT ----- Charles Flores  Form patient that no gross lesions were seen on the barium swallow or the modified barium swallow.  I reviewed the speech pathologist report.  Recommendations were  1.Diet: Regular, soften and moisten as needed for easier swallowing. The patient was counseled to "swallow hard" if he feels pharyngeal residue.  I will discuss this further at his follow-up visit.  If he has poor dentition that might need to be addressed ,if he is using false teeth he needs to make sure he has them on and is utilizing them during his meals  Dr Jonathon Bellows MD,MRCP [facility]) Gastroenterology/Hepatology Pager: [phone]

## 2019-11-12 ENCOUNTER — Ambulatory Visit (INDEPENDENT_AMBULATORY_CARE_PROVIDER_SITE_OTHER): Payer: Medicare Other | Admitting: Adult Health

## 2019-11-12 ENCOUNTER — Other Ambulatory Visit: Payer: Self-pay

## 2019-11-12 ENCOUNTER — Encounter: Payer: Self-pay | Admitting: Adult Health

## 2019-11-12 VITALS — BP 93/62 | HR 97 | Temp 95.6°F | Resp 16 | Ht 71.0 in | Wt 150.0 lb

## 2019-11-12 DIAGNOSIS — R42 Dizziness and giddiness: Secondary | ICD-10-CM

## 2019-11-12 DIAGNOSIS — C61 Malignant neoplasm of prostate: Secondary | ICD-10-CM

## 2019-11-12 DIAGNOSIS — K219 Gastro-esophageal reflux disease without esophagitis: Secondary | ICD-10-CM

## 2019-11-12 DIAGNOSIS — Z0001 Encounter for general adult medical examination with abnormal findings: Secondary | ICD-10-CM

## 2019-11-12 DIAGNOSIS — R300 Dysuria: Secondary | ICD-10-CM

## 2019-11-12 DIAGNOSIS — I950 Idiopathic hypotension: Secondary | ICD-10-CM

## 2019-11-12 NOTE — Progress Notes (Signed)
[facility] [place], [place] [address]  Internal MEDICINE  Office Visit Note  Patient Name: Charles Flores  [id number]  [id number]  Date of Service: 11/12/2019  Chief Complaint  Patient presents with  . Medicare Wellness  . Gastroesophageal Reflux  . Hyperlipidemia     HPI Pt is here for routine health maintenance examination.  He is a frail appearing 86 yo AA male. He has a history of hypotension, Anemia and prostate cancer.  Overall he appears at his baseline.  His nephew is in exam room at this time.  He reports his bp is well controlled.  currently 93/62 at this visit.  He Denies Chest pain, Shortness of breath, palpitations, headache, or blurred vision. He sees Dr. Tasia Catchings for his prostate cancer and anemia.  He will see them again at the end of April.  He denies any current complaints.  He has a good appetite and is cooperative and smiling in exam room today.    Current Medication: Outpatient Encounter Medications as of 11/12/2019  Medication Sig  . abiraterone acetate (ZYTIGA) 250 MG tablet TAKE 3 TABLETS BY MOUTH DAILY. TAKE ON AN EMPTY STOMACH 1 HOUR BEFORE OR 2 HOURS AFTER A MEAL  . midodrine (PROAMATINE) 2.5 MG tablet Take 1 tablet (2.5 mg total) by mouth 3 (three) times daily with meals.  Marland Kitchen omeprazole (PRILOSEC) 20 MG capsule TAKE 1 CAPSULE BY MOUTH EVERY DAY  . predniSONE (DELTASONE) 5 MG tablet TAKE 1 TABLET (5 MG TOTAL) BY MOUTH 2 (TWO) TIMES DAILY WITH A MEAL.  . [DISCONTINUED] abiraterone acetate (ZYTIGA) 250 MG tablet TAKE 3 TABLETS BY MOUTH DAILY. TAKE ON AN EMPTY STOMACH 1 HOUR BEFORE OR 2 HOURS AFTER A MEAL  . [DISCONTINUED] omeprazole (PRILOSEC) 20 MG capsule TAKE 1 CAPSULE BY MOUTH EVERY DAY  . [DISCONTINUED] predniSONE (DELTASONE) 5 MG tablet TAKE 1 TABLET (5 MG TOTAL) BY MOUTH 2 (TWO) TIMES DAILY WITH A MEAL.   No facility-administered encounter medications on file as of 11/12/2019.    Surgical History: Past Surgical History:  Procedure  Laterality Date  . PROSTATE BIOPSY      Medical History: Past Medical History:  Diagnosis Date  . Anemia 12/24/2014  . Arthritis   . B12 deficiency anemia 01/01/2015  . Cataract   . Coronary artery disease   . GERD (gastroesophageal reflux disease)   . Hyperlipidemia   . Hyperlipidemia   . Prostate cancer ([place])   . Renal insufficiency 04/15/2017  . Thyroid nodule 03/14/2016    Family History: Family History  Problem Relation Age of Onset  . Diabetes Sister   . Stroke Sister   . Gastric cancer Sister   . Cancer Mother   . Breast cancer Mother   . Kidney disease Father       Review of Systems  Constitutional: Negative.  Negative for chills, fatigue and unexpected weight change.  HENT: Negative.  Negative for congestion, rhinorrhea, sneezing and sore throat.   Eyes: Negative for redness.  Respiratory: Negative.  Negative for cough, chest tightness and shortness of breath.   Cardiovascular: Negative.  Negative for chest pain and palpitations.  Gastrointestinal: Negative.  Negative for abdominal pain, constipation, diarrhea, nausea and vomiting.  Endocrine: Negative.   Genitourinary: Negative.  Negative for dysuria and frequency.  Musculoskeletal: Negative.  Negative for arthralgias, back pain, joint swelling and neck pain.  Skin: Negative.  Negative for rash.  Allergic/Immunologic: Negative.   Neurological: Negative.  Negative for tremors and numbness.  Hematological:  Negative for adenopathy. Does not bruise/bleed easily.  Psychiatric/Behavioral: Negative.  Negative for behavioral problems, sleep disturbance and suicidal ideas. The patient is not nervous/anxious.      Vital Signs: BP 93/62   Pulse 97   Temp (!) 95.6 F (35.3 C)   Resp 16   Ht 5\' 11"  (1.803 m)   Wt 150 lb (68 kg)   SpO2 96%   BMI 20.92 kg/m    Physical Exam Vitals and nursing note reviewed.  Constitutional:      General: He is not in acute distress.    Appearance: He is well-developed. He is  not diaphoretic.  HENT:     Head: Normocephalic and atraumatic.     Mouth/Throat:     Pharynx: No oropharyngeal exudate.  Eyes:     Pupils: Pupils are equal, round, and reactive to light.  Neck:     Thyroid: No thyromegaly.     Vascular: No JVD.     Trachea: No tracheal deviation.  Cardiovascular:     Rate and Rhythm: Normal rate and regular rhythm.     Heart sounds: Normal heart sounds. No murmur. No friction rub. No gallop.   Pulmonary:     Effort: Pulmonary effort is normal. No respiratory distress.     Breath sounds: Normal breath sounds. No wheezing or rales.  Chest:     Chest wall: No tenderness.  Abdominal:     Palpations: Abdomen is soft.     Tenderness: There is no abdominal tenderness. There is no guarding.  Musculoskeletal:        General: Normal range of motion.     Cervical back: Normal range of motion and neck supple.  Lymphadenopathy:     Cervical: No cervical adenopathy.  Skin:    General: Skin is warm and dry.  Neurological:     Mental Status: He is alert and oriented to person, place, and time.     Cranial Nerves: No cranial nerve deficit.  Psychiatric:        Behavior: Behavior normal.        Thought Content: Thought content normal.        Judgment: Judgment normal.      LABS: Recent Results (from the past 2160 hour(s))  Vitamin B12     Status: None   Collection Time: 08/29/19  9:20 AM  Result Value Ref Range   Vitamin B-12 239 180 - 914 pg/mL    Comment: (NOTE) This assay is not validated for testing neonatal or myeloproliferative syndrome specimens for Vitamin B12 levels. Performed at [facility], [place] [address] [place], [place] [address]   PSA     Status: None   Collection Time: 08/29/19  9:20 AM  Result Value Ref Range   Prostatic Specific Antigen 0.92 0.00 - 4.00 ng/mL    Comment: (NOTE) While PSA levels of <=4.0 ng/ml are reported as reference range, some men with levels below 4.0 ng/ml can have prostate cancer and many  men with PSA above 4.0 ng/ml do not have prostate cancer.  Other tests such as free PSA, age specific reference ranges, PSA velocity and PSA doubling time may be helpful especially in men less than 3 years old. Performed at [facility], [place] [address] [place], [place] [address]   Comprehensive metabolic panel     Status: Abnormal   Collection Time: 08/29/19  9:20 AM  Result Value Ref Range   Sodium 138 135 - 145 mmol/L   Potassium  3.7 3.5 - 5.1 mmol/L   Chloride 108 98 - 111 mmol/L   CO2 22 22 - 32 mmol/L   Glucose, Bld 98 70 - 99 mg/dL   BUN 28 (H) 8 - 23 mg/dL   Creatinine, Ser 1.50 (H) 0.61 - 1.24 mg/dL   Calcium 9.0 8.9 - 10.3 mg/dL   Total Protein 6.3 (L) 6.5 - 8.1 g/dL   Albumin 3.6 3.5 - 5.0 g/dL   AST 15 15 - 41 U/L   ALT 11 0 - 44 U/L   Alkaline Phosphatase 71 38 - 126 U/L   Total Bilirubin 1.3 (H) 0.3 - 1.2 mg/dL   GFR calc non Af Amer 40 (L) >60 mL/min   GFR calc Af Amer 47 (L) >60 mL/min   Anion gap 8 5 - 15    Comment: Performed at [facility], [address] [place], [place] [address]  CBC with Differential/Platelet     Status: Abnormal   Collection Time: 08/29/19  9:20 AM  Result Value Ref Range   WBC 5.2 4.0 - 10.5 K/uL   RBC 3.03 (L) 4.22 - 5.81 MIL/uL   Hemoglobin 10.4 (L) 13.0 - 17.0 g/dL   HCT 31.2 (L) 39.0 - 52.0 %   MCV 103.0 (H) 80.0 - 100.0 fL   MCH 34.3 (H) 26.0 - 34.0 pg   MCHC 33.3 30.0 - 36.0 g/dL   RDW 13.4 11.5 - 15.5 %   Platelets 165 150 - 400 K/uL   nRBC 0.0 0.0 - 0.2 %   Neutrophils Relative % 79 %   Neutro Abs 4.1 1.7 - 7.7 K/uL   Lymphocytes Relative 13 %   Lymphs Abs 0.7 0.7 - 4.0 K/uL   Monocytes Relative 8 %   Monocytes Absolute 0.4 0.1 - 1.0 K/uL   Eosinophils Relative 0 %   Eosinophils Absolute 0.0 0.0 - 0.5 K/uL   Basophils Relative 0 %   Basophils Absolute 0.0 0.0 - 0.1 K/uL   Immature Granulocytes 0 %   Abs Immature Granulocytes 0.01 0.00 - 0.07 K/uL    Comment: Performed at [facility], [address] [place], [place] [address]     Assessment/Plan: 1. Encounter for general adult medical examination with abnormal findings Discussed checking thyroid and lipid panel.  Patient is not interested at this time.  Will leave orders, if he changes his mind.  - TSH + free T4 - Lipid Panel With LDL/HDL Ratio  2. Idiopathic hypotension Improved in recent months.  Continue to monitor.  3. Dizziness Much improved, rare episodes at this time and they are much more manageable.   4. Prostate cancer ([place]) Continue to see Dr. Tasia Catchings for management.   5. Gastroesophageal reflux disease without esophagitis Stable, continue present therapy.   General Counseling: lorinzo mallia understanding of the findings of todays visit and agrees with plan of treatment. I have discussed any further diagnostic evaluation that may be needed or ordered today. We also reviewed his medications today. he has been encouraged to call the office with any questions or concerns that should arise related to todays visit.   Orders Placed This Encounter  Procedures  . TSH + free T4  . Lipid Panel With LDL/HDL Ratio    No orders of the defined types were placed in this encounter.   Time spent: 25 Minutes   This patient was seen by Orson Gear AGNP-C in Collaboration with Dr Lavera Guise as a part of collaborative care agreement  Kendell Bane AGNP-C Internal Medicine

## 2019-11-13 LAB — URINALYSIS, ROUTINE W REFLEX MICROSCOPIC
Bilirubin, UA: NEGATIVE
Glucose, UA: NEGATIVE
Ketones, UA: NEGATIVE
Leukocytes,UA: NEGATIVE
Nitrite, UA: POSITIVE — AB
RBC, UA: NEGATIVE
Specific Gravity, UA: 1.018 (ref 1.005–1.030)
Urobilinogen, Ur: 1 mg/dL (ref 0.2–1.0)
pH, UA: 7 (ref 5.0–7.5)

## 2019-11-13 LAB — MICROSCOPIC EXAMINATION
Bacteria, UA: NONE SEEN
Casts: NONE SEEN /lpf
Epithelial Cells (non renal): NONE SEEN /hpf (ref 0–10)
RBC, Urine: NONE SEEN /hpf (ref 0–2)

## 2019-11-14 ENCOUNTER — Inpatient Hospital Stay: Payer: Medicare Other

## 2019-11-18 ENCOUNTER — Other Ambulatory Visit: Payer: Self-pay

## 2019-11-18 ENCOUNTER — Inpatient Hospital Stay: Payer: Medicare Other

## 2019-11-18 DIAGNOSIS — E538 Deficiency of other specified B group vitamins: Secondary | ICD-10-CM | POA: Diagnosis not present

## 2019-11-18 DIAGNOSIS — D519 Vitamin B12 deficiency anemia, unspecified: Secondary | ICD-10-CM

## 2019-11-18 MED ORDER — CYANOCOBALAMIN 1000 MCG/ML IJ SOLN
1000.0000 ug | Freq: Once | INTRAMUSCULAR | Status: AC
  Administered 2019-11-18: 1000 ug via INTRAMUSCULAR
  Filled 2019-11-18: qty 1

## 2019-12-04 ENCOUNTER — Ambulatory Visit: Payer: Medicare Other | Admitting: Gastroenterology

## 2019-12-19 ENCOUNTER — Inpatient Hospital Stay: Payer: Medicare Other

## 2019-12-19 ENCOUNTER — Inpatient Hospital Stay (HOSPITAL_BASED_OUTPATIENT_CLINIC_OR_DEPARTMENT_OTHER): Payer: Medicare Other | Admitting: Hospice and Palliative Medicine

## 2019-12-19 ENCOUNTER — Encounter: Payer: Self-pay | Admitting: Oncology

## 2019-12-19 ENCOUNTER — Other Ambulatory Visit: Payer: Self-pay

## 2019-12-19 ENCOUNTER — Inpatient Hospital Stay (HOSPITAL_BASED_OUTPATIENT_CLINIC_OR_DEPARTMENT_OTHER): Payer: Medicare Other | Admitting: Oncology

## 2019-12-19 ENCOUNTER — Inpatient Hospital Stay: Payer: Medicare Other | Attending: Oncology

## 2019-12-19 VITALS — BP 149/84 | HR 91 | Temp 95.4°F | Resp 18 | Wt 152.6 lb

## 2019-12-19 DIAGNOSIS — C61 Malignant neoplasm of prostate: Secondary | ICD-10-CM

## 2019-12-19 DIAGNOSIS — E538 Deficiency of other specified B group vitamins: Secondary | ICD-10-CM | POA: Diagnosis not present

## 2019-12-19 DIAGNOSIS — Z515 Encounter for palliative care: Secondary | ICD-10-CM | POA: Diagnosis not present

## 2019-12-19 DIAGNOSIS — D519 Vitamin B12 deficiency anemia, unspecified: Secondary | ICD-10-CM | POA: Diagnosis not present

## 2019-12-19 LAB — CBC WITH DIFFERENTIAL/PLATELET
Abs Immature Granulocytes: 0.01 10*3/uL (ref 0.00–0.07)
Basophils Absolute: 0 10*3/uL (ref 0.0–0.1)
Basophils Relative: 1 %
Eosinophils Absolute: 0.1 10*3/uL (ref 0.0–0.5)
Eosinophils Relative: 1 %
HCT: 33.6 % — ABNORMAL LOW (ref 39.0–52.0)
Hemoglobin: 11.2 g/dL — ABNORMAL LOW (ref 13.0–17.0)
Immature Granulocytes: 0 %
Lymphocytes Relative: 27 %
Lymphs Abs: 1.2 10*3/uL (ref 0.7–4.0)
MCH: 34.4 pg — ABNORMAL HIGH (ref 26.0–34.0)
MCHC: 33.3 g/dL (ref 30.0–36.0)
MCV: 103.1 fL — ABNORMAL HIGH (ref 80.0–100.0)
Monocytes Absolute: 0.6 10*3/uL (ref 0.1–1.0)
Monocytes Relative: 14 %
Neutro Abs: 2.6 10*3/uL (ref 1.7–7.7)
Neutrophils Relative %: 57 %
Platelets: 198 10*3/uL (ref 150–400)
RBC: 3.26 MIL/uL — ABNORMAL LOW (ref 4.22–5.81)
RDW: 12.7 % (ref 11.5–15.5)
WBC: 4.6 10*3/uL (ref 4.0–10.5)
nRBC: 0 % (ref 0.0–0.2)

## 2019-12-19 LAB — COMPREHENSIVE METABOLIC PANEL WITH GFR
ALT: 10 U/L (ref 0–44)
AST: 12 U/L — ABNORMAL LOW (ref 15–41)
Albumin: 3.6 g/dL (ref 3.5–5.0)
Alkaline Phosphatase: 71 U/L (ref 38–126)
Anion gap: 9 (ref 5–15)
BUN: 22 mg/dL (ref 8–23)
CO2: 26 mmol/L (ref 22–32)
Calcium: 9 mg/dL (ref 8.9–10.3)
Chloride: 106 mmol/L (ref 98–111)
Creatinine, Ser: 1.31 mg/dL — ABNORMAL HIGH (ref 0.61–1.24)
GFR calc Af Amer: 55 mL/min — ABNORMAL LOW
GFR calc non Af Amer: 48 mL/min — ABNORMAL LOW
Glucose, Bld: 92 mg/dL (ref 70–99)
Potassium: 4 mmol/L (ref 3.5–5.1)
Sodium: 141 mmol/L (ref 135–145)
Total Bilirubin: 1.2 mg/dL (ref 0.3–1.2)
Total Protein: 6.4 g/dL — ABNORMAL LOW (ref 6.5–8.1)

## 2019-12-19 LAB — PSA: Prostatic Specific Antigen: 0.78 ng/mL (ref 0.00–4.00)

## 2019-12-19 LAB — VITAMIN B12: Vitamin B-12: 374 pg/mL (ref 180–914)

## 2019-12-19 MED ORDER — LEUPROLIDE ACETATE (4 MONTH) 30 MG ~~LOC~~ KIT: 30.0000 mg | PACK | Freq: Once | SUBCUTANEOUS | Status: DC

## 2019-12-19 MED ORDER — CYANOCOBALAMIN 1000 MCG/ML IJ SOLN
1000.0000 ug | Freq: Once | INTRAMUSCULAR | Status: AC
  Administered 2019-12-19: 1000 ug via INTRAMUSCULAR
  Filled 2019-12-19: qty 1

## 2019-12-19 NOTE — Progress Notes (Signed)
[facility]  Telephone:([phone] [phone]   Name: Charles Flores Date: 12/19/2019 MRN: [id number]  DOB: 29-May-1927  Patient Care Team: Ronnell Freshwater, NP as PCP - General (Family Medicine) Collier Flowers, MD as Referring Physician (Urology)    REASON FOR CONSULTATION: Charles Flores is a 86 y.o. 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  Patient was referred to palliative care by medical oncology to establish goals and provide ongoing support.  SOCIAL HISTORY:     reports that he has quit smoking. His smoking use included cigarettes. He has a 7.50 pack-year smoking history. He has never used smokeless tobacco. He reports that he does not drink alcohol or use drugs.   Patient is unmarried.  He has no children.  He lives at home with his nephew.  He also has a niece who is involved in his care.  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  ADVANCE DIRECTIVES:  Does not have  CODE STATUS: DNR/DNI (MOST Form completed on 09/18/2019)  PAST MEDICAL HISTORY: Past Medical History:  Diagnosis Date  . Anemia 12/24/2014  . Arthritis   . B12 deficiency anemia 01/01/2015  . Cataract   . Coronary artery disease   . GERD (gastroesophageal reflux disease)   . Hyperlipidemia   . Hyperlipidemia   . Prostate cancer ([place])   . Renal insufficiency 04/15/2017  . Thyroid nodule 03/14/2016    PAST SURGICAL HISTORY:  Past Surgical History:  Procedure Laterality Date  . PROSTATE BIOPSY      HEMATOLOGY/ONCOLOGY HISTORY:  Oncology History   No history exists.    ALLERGIES:  has No Known Allergies.  MEDICATIONS:  Current Outpatient Medications  Medication Sig Dispense Refill  . abiraterone acetate (ZYTIGA) 250 MG tablet TAKE 3 TABLETS BY MOUTH DAILY. TAKE ON AN EMPTY STOMACH 1 HOUR BEFORE OR 2 HOURS AFTER A MEAL 90 tablet 2  .  midodrine (PROAMATINE) 2.5 MG tablet Take 1 tablet (2.5 mg total) by mouth 3 (three) times daily with meals. 270 tablet 0  . omeprazole (PRILOSEC) 20 MG capsule TAKE 1 CAPSULE BY MOUTH EVERY DAY 90 capsule 1  . predniSONE (DELTASONE) 5 MG tablet TAKE 1 TABLET (5 MG TOTAL) BY MOUTH 2 (TWO) TIMES DAILY WITH A MEAL. 60 tablet 3   No current facility-administered medications for this visit.    VITAL SIGNS: There were no vitals taken for this visit. There were no vitals filed for this visit.  Estimated body mass index is 21.28 kg/m as calculated from the following:   Height as of 11/12/19: 5\' 11"  (1.803 m).   Weight as of an earlier encounter on 12/19/19: 152 lb 9.6 oz (69.2 kg).  LABS: CBC:    Component Value Date/Time   WBC 4.6 12/19/2019 0904   HGB 11.2 (L) 12/19/2019 0904   HCT 33.6 (L) 12/19/2019 0904   PLT 198 12/19/2019 0904   MCV 103.1 (H) 12/19/2019 0904   NEUTROABS 2.6 12/19/2019 0904   LYMPHSABS 1.2 12/19/2019 0904   MONOABS 0.6 12/19/2019 0904   EOSABS 0.1 12/19/2019 0904   BASOSABS 0.0 12/19/2019 0904   Comprehensive Metabolic Panel:    Component Value Date/Time   NA 141 12/19/2019 0904   K 4.0 12/19/2019 0904   CL 106 12/19/2019 0904   CO2 26 12/19/2019 0904   BUN 22 12/19/2019 0904   CREATININE 1.31 (H) 12/19/2019 0904   GLUCOSE  92 12/19/2019 0904   CALCIUM 9.0 12/19/2019 0904   AST 12 (L) 12/19/2019 0904   ALT 10 12/19/2019 0904   ALKPHOS 71 12/19/2019 0904   BILITOT 1.2 12/19/2019 0904   PROT 6.4 (L) 12/19/2019 0904   ALBUMIN 3.6 12/19/2019 0904    RADIOGRAPHIC STUDIES: No results found.  PERFORMANCE STATUS (ECOG) : 1 - Symptomatic but completely ambulatory  Review of Systems Unless otherwise noted, a complete review of systems is negative.  Physical Exam General: NAD, frail appearing, thin Pulmonary: Unlabored Extremities: no edema, no joint deformities Skin: no rashes Neurological: Weakness but otherwise nonfocal  IMPRESSION: Routine  follow-up visit today with patient.  He was accompanied by his nephew.  Patient is doing well.  He denies any symptomatic complaints.  No changes or concerns from patient or family.  Performance status is good.  He is mowing the grass weekly.  Appetite is reportedly good.  No issues with medications nor need for refills.  Patient saw GI and underwent evaluation for dysphagia.  MBSS did not show concern for aspiration.  Patient says that his swallowing difficulty has improved over time.   PLAN: -Continue current scope of treatment -RTC in 3 months   Patient expressed understanding and was in agreement with this plan. He also understands that He can call the clinic at any time with any questions, concerns, or complaints.     Time Total: 15 minutes  Visit consisted of counseling and education dealing with the complex and emotionally intense issues of symptom management and palliative care in the setting of serious and potentially life-threatening illness.Greater than 50%  of this time was spent counseling and coordinating care related to the above assessment and plan.  Signed by: Altha Harm, PhD, NP-C

## 2019-12-19 NOTE — Progress Notes (Signed)
Patient here for follow up. No new concerns voiced.  °

## 2019-12-19 NOTE — Progress Notes (Signed)
[facility] day:  12/19/2019    Chief Complaint: Charles Flores is an 86 y.o. male with prostate cancer and B12 deficiency who is seen for  3 month assessment.  Pertinent oncology history Patient previously follows up with Dr. Mike Gip.  He switched care to me on 09/05/2018. The patient is a 86 year old African American gentleman with prostate cancer and a rising PSA on Lupron.  Prostate cancer was discovered approximately 20 years secondary to an elevated PSA.  He underwent biopsy at [facility] (no report available). Initial PSA was 774.   I have personally reviewed the radiological images as listed and agreed with the findings in the report. Abdomen and pelvic CT on 01/05/2006 revealed an enlarged and heterogeneous prostate with mass effect on the base of the bladder.  There was no adenopathy.  Chest, abdomen, and pelvic CT on 03/15/2016 revealed moderate centrilobular emphysema.  There were mild lucent mottled appearance of the osseous structures (cannot exclude multiple myeloma).  There were scattered pulmonary nodules (4 mm or less in size).  There was a 2.7 cm low-attenuation left thyroid nodule.    Abdomen and pelvic CT on 12/07/2016 revealed no new or progressive findings.  There was no evidence of lymphadenopathy in the abdomen or pelvis. There was a stable appearance of heterogeneous/mottled bony mineralization.  Abdomen and pelvic CT on 11/27/2017 was stable with no evidence of adenopathy within the abdomen and pelvis.  There were no suspicious bone lesions.  There was a stable 3 mm RLL nodule.  Work-up on 03/17/2016 revealed no monoclonal protein.  SPEP revealed no monoclonal protein.  Kappa free light chains were 28.9, lambda free light chains 20.7 with a ratio of 1.40 (normal).  Immunoglobulins were normal (IgG 1391, IgA 282, IgM 29).  24 hour urine revealed no monoclonal protein. Thyroid ultrasound on 03/22/2016 revealed a  solitary 2.9 cm left lower pole nodule.  Thyroid biopsy on 04/14/2016 was benign (Bethesda category II) with abundant foamy macrophages and colloid. The cytologic findings were compatible with a cystic colloid nodule  Bone scan on 04/08/2014, 12/29/2014, 02/23/2016, 12/07/2016, and 11/27/2017 revealed no evidence of metastatic disease.  Patient has been on Lupron and Casodex.  He receives Lupron 30 mg every 4 months (last 12/06/2017).    PSA has been followed: 127.6 on 06/30/2014, 168.3 on 12/02/2014, 63.32 on 02/01/2015, 40.31 on 03/03/2015, 29.12 on 04/08/2015, 11.28 on 07/14/2015, 11.02 on 08/17/2015, 10.35 on 11/15/2015, 14.37 on 02/15/2016, 12.46 on 06/06/2016, 13.5 on 06/23/2016, 16.63 on 09/05/2016, 20.77 on 11/07/2016, 16.41 on 12/11/2016, 26.69 on 02/27/2017, 25.67 on 04/02/2017, 19.16 on 05/10/2017, 18.94 on 07/02/2017, 28.28 on 10/01/2017, 30.23 on 12/06/2017, and 34.56 on 03/14/2018.  He has a normocytic anemia. He was diagnosed with B12 deficiency on 12/24/2014. B12 was 169 (low).  He began B12 supplimentation on 02/01/2015 (last 11/15/2017).  Parietal cell antibody was positive on 07/02/2017.  Folate was 11 on 12/24/2014 and 12 on 06/06/2016.  Normal studies included a CMP, ferritin, iron studies, folate, and TSH. TSH and free T4 were normal on 04/02/2017.   # Limited abdominal ultrasound was done on 03/14/2018 revealed a 21.8 x 8.3 x 10.2 cm fatty mass with internal calcifications.  Findings felt to correspond to the CT findings on 11/2017, and not significantly changed from 12/2015 scan.  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  Neoplasm felt to be unlikely.  # chronic bilateral lower extremity ankle edema, ultrasound  showed negative for DVT.  INTERVAL HISTORY Charles Flores is a 86 y.o. male who has above history reviewed by me today presents for follow up visit for management of prostate cancer. Patient was accompanied by her nephew. He reports  feeling well.  He has no new complaints.   He takes Zytiga 750 mg daily as well as prednisone 5 mg twice daily. Patient has no new complaints today.  He has been doing well..   Patient is on midodrine for chronic hypotension.  Denies any dizziness.     Past Medical History:  Diagnosis Date  . Anemia 12/24/2014  . Arthritis   . B12 deficiency anemia 01/01/2015  . Cataract   . Coronary artery disease   . GERD (gastroesophageal reflux disease)   . Hyperlipidemia   . Hyperlipidemia   . Prostate cancer ([place])   . Renal insufficiency 04/15/2017  . Thyroid nodule 03/14/2016    Past Surgical History:  Procedure Laterality Date  . PROSTATE BIOPSY      Family History  Problem Relation Age of Onset  . Diabetes Sister   . Stroke Sister   . Gastric cancer Sister   . Cancer Mother   . Breast cancer Mother   . Kidney disease Father    Social History   Socioeconomic History  . Marital status: Single    Spouse name: Not on file  . Number of children: Not on file  . Years of education: Not on file  . Highest education level: Not on file  Occupational History  . Not on file  Tobacco Use  . Smoking status: Former Smoker    Packs/day: 0.50    Years: 15.00    Pack years: 7.50    Types: Cigarettes  . Smokeless tobacco: Never Used  . Tobacco comment: quit 40 years  Substance and Sexual Activity  . Alcohol use: No    Alcohol/week: 0.0 standard drinks  . Drug use: No  . Sexual activity: Yes    Partners: Male  Other Topics Concern  . Not on file  Social History Narrative  . Not on file   Social Determinants of Health   Financial Resource Strain:   . Difficulty of Paying Living Expenses:   Food Insecurity:   . Worried About Charity fundraiser in the Last Year:   . Arboriculturist in the Last Year:   Transportation Needs:   . Film/video editor (Medical):   Marland Kitchen Lack of Transportation (Non-Medical):   Physical Activity:   . Days of Exercise per Week:   . Minutes of Exercise  per Session:   Stress:   . Feeling of Stress :   Social Connections:   . Frequency of Communication with Friends and Family:   . Frequency of Social Gatherings with Friends and Family:   . Attends Religious Services:   . Active Member of Clubs or Organizations:   . Attends Archivist Meetings:   Marland Kitchen Marital Status:   Intimate Partner Violence:   . Fear of Current or Ex-Partner:   . Emotionally Abused:   Marland Kitchen Physically Abused:   . Sexually Abused:      Allergies: No Known Allergies  Current Outpatient Medications  Medication Sig Dispense Refill  . abiraterone acetate (ZYTIGA) 250 MG tablet TAKE 3 TABLETS BY MOUTH DAILY. TAKE ON AN EMPTY STOMACH 1 HOUR BEFORE OR 2 HOURS AFTER A MEAL 90 tablet 2  . midodrine (PROAMATINE) 2.5 MG tablet Take 1 tablet (2.5 mg total)  by mouth 3 (three) times daily with meals. 270 tablet 0  . omeprazole (PRILOSEC) 20 MG capsule TAKE 1 CAPSULE BY MOUTH EVERY DAY 90 capsule 1  . predniSONE (DELTASONE) 5 MG tablet TAKE 1 TABLET (5 MG TOTAL) BY MOUTH 2 (TWO) TIMES DAILY WITH A MEAL. 60 tablet 3   No current facility-administered medications for this visit.   Review of Systems  Constitutional: Positive for fatigue. Negative for appetite change, chills, fever and unexpected weight change.  HENT:   Negative for hearing loss and voice change.   Eyes: Negative for eye problems and icterus.  Respiratory: Negative for chest tightness, cough and shortness of breath.   Cardiovascular: Positive for leg swelling. Negative for chest pain.  Gastrointestinal: Negative for abdominal distention and abdominal pain.  Endocrine: Negative for hot flashes.  Genitourinary: Negative for difficulty urinating, dysuria and frequency.   Musculoskeletal: Negative for arthralgias.  Skin: Negative for itching and rash.  Neurological: Negative for light-headedness and numbness.  Hematological: Negative for adenopathy. Does not bruise/bleed easily.  Psychiatric/Behavioral:  Negative for confusion.    Performance status (ECOG): 1 - Symptomatic but completely ambulatory  Vital Signs BP (!) 149/84 (BP Location: Left Arm, Patient Position: Sitting)   Pulse 91   Temp (!) 95.4 F (35.2 C)   Resp 18   Wt 152 lb 9.6 oz (69.2 kg)   BMI 21.28 kg/m   Physical Exam  Constitutional: He is oriented to person, place, and time. He appears healthy. No distress.  Thin, frail appearance  HENT:  Head: Normocephalic and atraumatic.  Nose: Nose normal.  Mouth/Throat: Oropharynx is clear and moist and mucous membranes are normal. He has dentures. No oropharyngeal exudate.  Eyes: Pupils are equal, round, and reactive to light. EOM are normal. No scleral icterus.  Neck: No tracheal deviation present. No thyromegaly present.  Cardiovascular: Normal rate, regular rhythm, normal heart sounds and intact distal pulses. Exam reveals no gallop and no friction rub.  No murmur heard. Pulmonary/Chest: Effort normal and breath sounds normal. No respiratory distress. He has no wheezes. He has no rales. He exhibits no tenderness.  Abdominal: Soft. Bowel sounds are normal. He exhibits no distension. Mass: Large right flank mass: Previously ultrasound consistent with a lipomatous growth fat necrosis and resultant calcification. There is no abdominal tenderness.  Musculoskeletal:        General: No tenderness or edema. Normal range of motion.     Cervical back: Normal range of motion and neck supple.  Lymphadenopathy:    He has no cervical adenopathy.    He has no axillary adenopathy.       Right: No supraclavicular adenopathy present.       Left: No supraclavicular adenopathy present.  Neurological: He is alert and oriented to person, place, and time. No cranial nerve deficit. He exhibits normal muscle tone. Coordination normal.  Skin: Skin is warm and dry. No rash noted. He is not diaphoretic. No erythema.  Psychiatric: Mood and affect normal.  Nursing note and vitals reviewed.  CBC     Component Value Date/Time   WBC 4.6 12/19/2019 0904   RBC 3.26 (L) 12/19/2019 0904   HGB 11.2 (L) 12/19/2019 0904   HCT 33.6 (L) 12/19/2019 0904   PLT 198 12/19/2019 0904   MCV 103.1 (H) 12/19/2019 0904   MCH 34.4 (H) 12/19/2019 0904   MCHC 33.3 12/19/2019 0904   RDW 12.7 12/19/2019 0904   LYMPHSABS 1.2 12/19/2019 0904   MONOABS 0.6 12/19/2019 0904   EOSABS  0.1 12/19/2019 0904   BASOSABS 0.0 12/19/2019 0904   CMP Latest Ref Rng & Units 12/19/2019 08/29/2019 05/29/2019  Glucose 70 - 99 mg/dL 92 98 90  BUN 8 - 23 mg/dL 22 28(H) 27(H)  Creatinine 0.61 - 1.24 mg/dL 1.31(H) 1.50(H) 1.45(H)  Sodium 135 - 145 mmol/L 141 138 139  Potassium 3.5 - 5.1 mmol/L 4.0 3.7 3.6  Chloride 98 - 111 mmol/L 106 108 108  CO2 22 - 32 mmol/L '26 22 24  ' Calcium 8.9 - 10.3 mg/dL 9.0 9.0 9.2  Total Protein 6.5 - 8.1 g/dL 6.4(L) 6.3(L) 6.8  Total Bilirubin 0.3 - 1.2 mg/dL 1.2 1.3(H) 1.0  Alkaline Phos 38 - 126 U/L 71 71 67  AST 15 - 41 U/L 12(L) 15 15  ALT 0 - 44 U/L '10 11 11    ' Assessment and Plan 86 y.o. male present for management of prostate cancer and vitamin B12 deficiency. 1. Anemia due to vitamin B12 deficiency, unspecified B12 deficiency type   2. 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  Today's PSA is pending. Continue current regimen.  Continue omeprazole for GI prophylaxis  #Androgen deprivation therapy, Lupron 22.5 mg will be given around 01/18/2020. #Pernicious anemia Vitamin B12 deficiency,B12 is pending today Continue parenteral B12 injection monthly   #Anemia  hemoglobin has decreased to 11.2, multifactorial.  Secondary to CKD, vitamin B12 deficiency. Follow up with palliative care.    Follow-up in 4 months. Orders Placed This Encounter  Procedures  . CBC with Differential/Platelet    Standing Status:   Future    Number of Occurrences:   1     Standing Expiration Date:   12/18/2020  . Comprehensive metabolic panel    Standing Status:   Future    Number of Occurrences:   1    Standing Expiration Date:   12/18/2020  . Vitamin B12    Standing Status:   Future    Number of Occurrences:   1    Standing Expiration Date:   12/18/2020  . PSA    Standing Status:   Future    Number of Occurrences:   1    Standing Expiration Date:   12/18/2020    We spent sufficient time to discuss many aspect of care, questions were answered to patient's satisfaction.   Earlie Server, MD, PhD 12/19/2019

## 2019-12-29 ENCOUNTER — Other Ambulatory Visit: Payer: Self-pay

## 2019-12-29 DIAGNOSIS — C61 Malignant neoplasm of prostate: Secondary | ICD-10-CM

## 2019-12-29 MED ORDER — ABIRATERONE ACETATE 250 MG PO TABS: ORAL_TABLET | ORAL | 2 refills | Status: DC

## 2019-12-29 MED ORDER — PREDNISONE 5 MG PO TABS: 5.0000 mg | ORAL_TABLET | Freq: Two times a day (BID) | ORAL | 5 refills | Status: DC

## 2020-01-16 ENCOUNTER — Inpatient Hospital Stay: Payer: Medicare Other | Attending: Oncology

## 2020-01-16 ENCOUNTER — Other Ambulatory Visit: Payer: Self-pay

## 2020-01-16 DIAGNOSIS — C61 Malignant neoplasm of prostate: Secondary | ICD-10-CM | POA: Diagnosis not present

## 2020-01-16 DIAGNOSIS — E538 Deficiency of other specified B group vitamins: Secondary | ICD-10-CM | POA: Insufficient documentation

## 2020-01-16 DIAGNOSIS — Z5111 Encounter for antineoplastic chemotherapy: Secondary | ICD-10-CM | POA: Insufficient documentation

## 2020-01-16 DIAGNOSIS — D519 Vitamin B12 deficiency anemia, unspecified: Secondary | ICD-10-CM

## 2020-01-16 MED ORDER — CYANOCOBALAMIN 1000 MCG/ML IJ SOLN
1000.0000 ug | Freq: Once | INTRAMUSCULAR | Status: AC
  Administered 2020-01-16: 1000 ug via INTRAMUSCULAR
  Filled 2020-01-16: qty 1

## 2020-01-16 MED ORDER — LEUPROLIDE ACETATE (3 MONTH) 22.5 MG IM KIT: 22.5000 mg | PACK | Freq: Once | INTRAMUSCULAR | Status: DC

## 2020-01-16 MED ORDER — LEUPROLIDE ACETATE (3 MONTH) 22.5 MG ~~LOC~~ KIT
22.5000 mg | PACK | Freq: Once | SUBCUTANEOUS | Status: AC
  Administered 2020-01-16: 22.5 mg via SUBCUTANEOUS
  Filled 2020-01-16: qty 22.5

## 2020-01-21 MED FILL — predniSONE 5 MG TABS: 5 | 30 days supply | Qty: 60 | Fill #3

## 2020-02-17 ENCOUNTER — Inpatient Hospital Stay: Payer: Medicare Other | Attending: Oncology

## 2020-02-17 ENCOUNTER — Other Ambulatory Visit: Payer: Self-pay

## 2020-02-17 DIAGNOSIS — D519 Vitamin B12 deficiency anemia, unspecified: Secondary | ICD-10-CM

## 2020-02-17 DIAGNOSIS — E538 Deficiency of other specified B group vitamins: Secondary | ICD-10-CM | POA: Insufficient documentation

## 2020-02-17 MED ORDER — CYANOCOBALAMIN 1000 MCG/ML IJ SOLN
1000.0000 ug | Freq: Once | INTRAMUSCULAR | Status: AC
  Administered 2020-02-17: 1000 ug via INTRAMUSCULAR
  Filled 2020-02-17: qty 1

## 2020-02-24 MED FILL — ABIRATERONE ACETATE 250 MG: 250 | 30 days supply | Qty: 90 | Fill #1

## 2020-02-24 MED FILL — predniSONE 5 MG TABS: 5 | 30 days supply | Qty: 60 | Fill #1

## 2020-02-29 ENCOUNTER — Other Ambulatory Visit: Payer: Self-pay | Admitting: Oncology

## 2020-03-01 DIAGNOSIS — Z23 Encounter for immunization: Secondary | ICD-10-CM | POA: Diagnosis not present

## 2020-03-01 DIAGNOSIS — Z7189 Other specified counseling: Secondary | ICD-10-CM | POA: Diagnosis not present

## 2020-03-19 ENCOUNTER — Inpatient Hospital Stay: Payer: Medicare Other | Attending: Oncology

## 2020-03-19 ENCOUNTER — Other Ambulatory Visit: Payer: Self-pay

## 2020-03-19 ENCOUNTER — Encounter: Payer: Medicare Other | Admitting: Hospice and Palliative Medicine

## 2020-03-19 ENCOUNTER — Ambulatory Visit: Payer: Medicare Other | Admitting: Oncology

## 2020-03-19 ENCOUNTER — Ambulatory Visit: Payer: Medicare Other

## 2020-03-19 ENCOUNTER — Other Ambulatory Visit: Payer: Medicare Other

## 2020-03-19 DIAGNOSIS — E538 Deficiency of other specified B group vitamins: Secondary | ICD-10-CM | POA: Diagnosis not present

## 2020-03-19 DIAGNOSIS — D519 Vitamin B12 deficiency anemia, unspecified: Secondary | ICD-10-CM

## 2020-03-19 MED ORDER — CYANOCOBALAMIN 1000 MCG/ML IJ SOLN
1000.0000 ug | Freq: Once | INTRAMUSCULAR | Status: AC
  Administered 2020-03-19: 1000 ug via INTRAMUSCULAR
  Filled 2020-03-19: qty 1

## 2020-03-22 DIAGNOSIS — Z23 Encounter for immunization: Secondary | ICD-10-CM | POA: Diagnosis not present

## 2020-03-22 DIAGNOSIS — Z7189 Other specified counseling: Secondary | ICD-10-CM | POA: Diagnosis not present

## 2020-03-25 MED FILL — ABIRATERONE ACETATE 250 MG: 250 | 30 days supply | Qty: 90 | Fill #2

## 2020-03-25 MED FILL — predniSONE 5 MG TABS: 5 | 30 days supply | Qty: 60 | Fill #2

## 2020-03-26 ENCOUNTER — Other Ambulatory Visit: Payer: Self-pay | Admitting: Adult Health

## 2020-03-26 DIAGNOSIS — I951 Orthostatic hypotension: Secondary | ICD-10-CM

## 2020-03-26 DIAGNOSIS — I950 Idiopathic hypotension: Secondary | ICD-10-CM

## 2020-04-16 ENCOUNTER — Ambulatory Visit: Payer: Medicare Other

## 2020-04-16 ENCOUNTER — Encounter: Payer: Medicare Other | Admitting: Hospice and Palliative Medicine

## 2020-04-16 ENCOUNTER — Other Ambulatory Visit: Payer: Medicare Other

## 2020-04-16 ENCOUNTER — Ambulatory Visit: Payer: Medicare Other | Admitting: Oncology

## 2020-04-20 ENCOUNTER — Other Ambulatory Visit: Payer: Self-pay

## 2020-04-20 ENCOUNTER — Inpatient Hospital Stay: Payer: Medicare Other

## 2020-04-20 ENCOUNTER — Inpatient Hospital Stay: Payer: Medicare Other | Attending: Oncology | Admitting: Oncology

## 2020-04-20 ENCOUNTER — Inpatient Hospital Stay: Payer: Medicare Other | Admitting: Hospice and Palliative Medicine

## 2020-04-20 ENCOUNTER — Encounter: Payer: Self-pay | Admitting: Oncology

## 2020-04-20 VITALS — BP 114/57 | HR 45 | Temp 96.9°F | Resp 16 | Wt 145.3 lb

## 2020-04-20 DIAGNOSIS — C61 Malignant neoplasm of prostate: Secondary | ICD-10-CM | POA: Diagnosis present

## 2020-04-20 DIAGNOSIS — Z87891 Personal history of nicotine dependence: Secondary | ICD-10-CM | POA: Diagnosis not present

## 2020-04-20 DIAGNOSIS — I9589 Other hypotension: Secondary | ICD-10-CM | POA: Insufficient documentation

## 2020-04-20 DIAGNOSIS — D519 Vitamin B12 deficiency anemia, unspecified: Secondary | ICD-10-CM

## 2020-04-20 DIAGNOSIS — Z5111 Encounter for antineoplastic chemotherapy: Secondary | ICD-10-CM | POA: Diagnosis not present

## 2020-04-20 DIAGNOSIS — D631 Anemia in chronic kidney disease: Secondary | ICD-10-CM | POA: Insufficient documentation

## 2020-04-20 DIAGNOSIS — R634 Abnormal weight loss: Secondary | ICD-10-CM | POA: Diagnosis not present

## 2020-04-20 DIAGNOSIS — E538 Deficiency of other specified B group vitamins: Secondary | ICD-10-CM | POA: Insufficient documentation

## 2020-04-20 DIAGNOSIS — N1831 Chronic kidney disease, stage 3a: Secondary | ICD-10-CM | POA: Diagnosis not present

## 2020-04-20 LAB — CBC WITH DIFFERENTIAL/PLATELET
Abs Immature Granulocytes: 0.02 10*3/uL (ref 0.00–0.07)
Basophils Absolute: 0 10*3/uL (ref 0.0–0.1)
Basophils Relative: 1 %
Eosinophils Absolute: 0 10*3/uL (ref 0.0–0.5)
Eosinophils Relative: 0 %
HCT: 33.3 % — ABNORMAL LOW (ref 39.0–52.0)
Hemoglobin: 11.7 g/dL — ABNORMAL LOW (ref 13.0–17.0)
Immature Granulocytes: 0 %
Lymphocytes Relative: 25 %
Lymphs Abs: 1.3 10*3/uL (ref 0.7–4.0)
MCH: 34.8 pg — ABNORMAL HIGH (ref 26.0–34.0)
MCHC: 35.1 g/dL (ref 30.0–36.0)
MCV: 99.1 fL (ref 80.0–100.0)
Monocytes Absolute: 0.5 10*3/uL (ref 0.1–1.0)
Monocytes Relative: 10 %
Neutro Abs: 3.4 10*3/uL (ref 1.7–7.7)
Neutrophils Relative %: 64 %
Platelets: 165 10*3/uL (ref 150–400)
RBC: 3.36 MIL/uL — ABNORMAL LOW (ref 4.22–5.81)
RDW: 13 % (ref 11.5–15.5)
WBC: 5.3 10*3/uL (ref 4.0–10.5)
nRBC: 0 % (ref 0.0–0.2)

## 2020-04-20 LAB — PSA: Prostatic Specific Antigen: 0.78 ng/mL (ref 0.00–4.00)

## 2020-04-20 LAB — COMPREHENSIVE METABOLIC PANEL
ALT: 11 U/L (ref 0–44)
AST: 12 U/L — ABNORMAL LOW (ref 15–41)
Albumin: 4 g/dL (ref 3.5–5.0)
Alkaline Phosphatase: 67 U/L (ref 38–126)
Anion gap: 9 (ref 5–15)
BUN: 25 mg/dL — ABNORMAL HIGH (ref 8–23)
CO2: 25 mmol/L (ref 22–32)
Calcium: 9.1 mg/dL (ref 8.9–10.3)
Chloride: 107 mmol/L (ref 98–111)
Creatinine, Ser: 1.3 mg/dL — ABNORMAL HIGH (ref 0.61–1.24)
GFR calc Af Amer: 55 mL/min — ABNORMAL LOW (ref >60)
GFR calc non Af Amer: 48 mL/min — ABNORMAL LOW (ref >60)
Glucose, Bld: 102 mg/dL — ABNORMAL HIGH (ref 70–99)
Potassium: 3.9 mmol/L (ref 3.5–5.1)
Sodium: 141 mmol/L (ref 135–145)
Total Bilirubin: 1.2 mg/dL (ref 0.3–1.2)
Total Protein: 7 g/dL (ref 6.5–8.1)

## 2020-04-20 LAB — VITAMIN B12: Vitamin B-12: 323 pg/mL (ref 180–914)

## 2020-04-20 MED ORDER — LEUPROLIDE ACETATE (3 MONTH) 22.5 MG ~~LOC~~ KIT
22.5000 mg | PACK | Freq: Once | SUBCUTANEOUS | Status: AC
  Administered 2020-04-20: 22.5 mg via SUBCUTANEOUS
  Filled 2020-04-20: qty 22.5

## 2020-04-20 MED ORDER — CYANOCOBALAMIN 1000 MCG/ML IJ SOLN
1000.0000 ug | Freq: Once | INTRAMUSCULAR | Status: AC
  Administered 2020-04-20: 1000 ug via INTRAMUSCULAR
  Filled 2020-04-20: qty 1

## 2020-04-20 NOTE — Progress Notes (Signed)
Patient denies new problems/concerns today.   °

## 2020-04-20 NOTE — Progress Notes (Signed)
[facility] day:  04/20/2020    Chief Complaint: Charles Flores is an 86 y.o. male with prostate cancer and B12 deficiency who is seen for  3 month assessment.  Pertinent oncology history Patient previously follows up with Dr. Mike Gip.  He switched care to me on 09/05/2018. The patient is a 86 year old African American gentleman with prostate cancer and a rising PSA on Lupron.  Prostate cancer was discovered approximately 20 years secondary to an elevated PSA.  He underwent biopsy at [facility] (no report available). Initial PSA was 774.   I have personally reviewed the radiological images as listed and agreed with the findings in the report. Abdomen and pelvic CT on 01/05/2006 revealed an enlarged and heterogeneous prostate with mass effect on the base of the bladder.  There was no adenopathy.  Chest, abdomen, and pelvic CT on 03/15/2016 revealed moderate centrilobular emphysema.  There were mild lucent mottled appearance of the osseous structures (cannot exclude multiple myeloma).  There were scattered pulmonary nodules (4 mm or less in size).  There was a 2.7 cm low-attenuation left thyroid nodule.    Abdomen and pelvic CT on 12/07/2016 revealed no new or progressive findings.  There was no evidence of lymphadenopathy in the abdomen or pelvis. There was a stable appearance of heterogeneous/mottled bony mineralization.  Abdomen and pelvic CT on 11/27/2017 was stable with no evidence of adenopathy within the abdomen and pelvis.  There were no suspicious bone lesions.  There was a stable 3 mm RLL nodule.  Work-up on 03/17/2016 revealed no monoclonal protein.  SPEP revealed no monoclonal protein.  Kappa free light chains were 28.9, lambda free light chains 20.7 with a ratio of 1.40 (normal).  Immunoglobulins were normal (IgG 1391, IgA 282, IgM 29).  24 hour urine revealed no monoclonal protein. Thyroid ultrasound on 03/22/2016 revealed a  solitary 2.9 cm left lower pole nodule.  Thyroid biopsy on 04/14/2016 was benign (Bethesda category II) with abundant foamy macrophages and colloid. The cytologic findings were compatible with a cystic colloid nodule  Bone scan on 04/08/2014, 12/29/2014, 02/23/2016, 12/07/2016, and 11/27/2017 revealed no evidence of metastatic disease.  Patient has been on Lupron and Casodex.  He receives Lupron 30 mg every 4 months (last 12/06/2017).    PSA has been followed: 127.6 on 06/30/2014, 168.3 on 12/02/2014, 63.32 on 02/01/2015, 40.31 on 03/03/2015, 29.12 on 04/08/2015, 11.28 on 07/14/2015, 11.02 on 08/17/2015, 10.35 on 11/15/2015, 14.37 on 02/15/2016, 12.46 on 06/06/2016, 13.5 on 06/23/2016, 16.63 on 09/05/2016, 20.77 on 11/07/2016, 16.41 on 12/11/2016, 26.69 on 02/27/2017, 25.67 on 04/02/2017, 19.16 on 05/10/2017, 18.94 on 07/02/2017, 28.28 on 10/01/2017, 30.23 on 12/06/2017, and 34.56 on 03/14/2018.  He has a normocytic anemia. He was diagnosed with B12 deficiency on 12/24/2014. B12 was 169 (low).  He began B12 supplimentation on 02/01/2015 (last 11/15/2017).  Parietal cell antibody was positive on 07/02/2017.  Folate was 11 on 12/24/2014 and 12 on 06/06/2016.  Normal studies included a CMP, ferritin, iron studies, folate, and TSH. TSH and free T4 were normal on 04/02/2017.   # Limited abdominal ultrasound was done on 03/14/2018 revealed a 21.8 x 8.3 x 10.2 cm fatty mass with internal calcifications.  Findings felt to correspond to the CT findings on 11/2017, and not significantly changed from 12/2015 scan.  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  Neoplasm felt to be unlikely.  # chronic bilateral lower extremity ankle edema, ultrasound  showed negative for DVT. #Chronic hypotension on midodrine  INTERVAL HISTORY Charles Flores is a 86 y.o. male who has above history reviewed by me today presents for follow up visit for management of prostate cancer. Patient was  accompanied by her nephew. Patient reports feeling well.  He has a good appetite. He has decreased 7 pounds since last visit in April. Patient takes Zytiga 750 mg daily as well as prednisone 5 mg twice daily. Occasionally he has back pain.  Today he has no new complaints.    Past Medical History:  Diagnosis Date  . Anemia 12/24/2014  . Arthritis   . B12 deficiency anemia 01/01/2015  . Cataract   . Coronary artery disease   . GERD (gastroesophageal reflux disease)   . Hyperlipidemia   . Hyperlipidemia   . Prostate cancer ([place])   . Renal insufficiency 04/15/2017  . Thyroid nodule 03/14/2016    Past Surgical History:  Procedure Laterality Date  . PROSTATE BIOPSY      Family History  Problem Relation Age of Onset  . Diabetes Sister   . Stroke Sister   . Gastric cancer Sister   . Cancer Mother   . Breast cancer Mother   . Kidney disease Father    Social History   Socioeconomic History  . Marital status: Single    Spouse name: Not on file  . Number of children: Not on file  . Years of education: Not on file  . Highest education level: Not on file  Occupational History  . Not on file  Tobacco Use  . Smoking status: Former Smoker    Packs/day: 0.50    Years: 15.00    Pack years: 7.50    Types: Cigarettes  . Smokeless tobacco: Never Used  . Tobacco comment: quit 40 years  Vaping Use  . Vaping Use: Never used  Substance and Sexual Activity  . Alcohol use: No    Alcohol/week: 0.0 standard drinks  . Drug use: No  . Sexual activity: Yes    Partners: Male  Other Topics Concern  . Not on file  Social History Narrative  . Not on file   Social Determinants of Health   Financial Resource Strain:   . Difficulty of Paying Living Expenses: Not on file  Food Insecurity:   . Worried About Charity fundraiser in the Last Year: Not on file  . Ran Out of Food in the Last Year: Not on file  Transportation Needs:   . Lack of Transportation (Medical): Not on file  . Lack of  Transportation (Non-Medical): Not on file  Physical Activity:   . Days of Exercise per Week: Not on file  . Minutes of Exercise per Session: Not on file  Stress:   . Feeling of Stress : Not on file  Social Connections:   . Frequency of Communication with Friends and Family: Not on file  . Frequency of Social Gatherings with Friends and Family: Not on file  . Attends Religious Services: Not on file  . Active Member of Clubs or Organizations: Not on file  . Attends Archivist Meetings: Not on file  . Marital Status: Not on file  Intimate Partner Violence:   . Fear of Current or Ex-Partner: Not on file  . Emotionally Abused: Not on file  . Physically Abused: Not on file  . Sexually Abused: Not on file     Allergies: No Known Allergies  Current Outpatient Medications  Medication Sig Dispense Refill  .  abiraterone acetate (ZYTIGA) 250 MG tablet TAKE 3 TABLETS BY MOUTH DAILY. TAKE ON AN EMPTY STOMACH 1 HOUR BEFORE OR 2 HOURS AFTER A MEAL 90 tablet 2  . midodrine (PROAMATINE) 2.5 MG tablet TAKE 1 TABLET (2.5 MG TOTAL) BY MOUTH 3 (THREE) TIMES DAILY WITH MEALS. 270 tablet 2  . omeprazole (PRILOSEC) 20 MG capsule TAKE 1 CAPSULE BY MOUTH EVERY DAY 90 capsule 1  . predniSONE (DELTASONE) 5 MG tablet Take 1 tablet (5 mg total) by mouth 2 (two) times daily with a meal. 60 tablet 5   No current facility-administered medications for this visit.   Review of Systems  Constitutional: Positive for fatigue. Negative for appetite change, chills, fever and unexpected weight change.  HENT:   Negative for hearing loss and voice change.   Eyes: Negative for eye problems and icterus.  Respiratory: Negative for chest tightness, cough and shortness of breath.   Cardiovascular: Positive for leg swelling. Negative for chest pain.  Gastrointestinal: Negative for abdominal distention and abdominal pain.  Endocrine: Negative for hot flashes.  Genitourinary: Negative for difficulty urinating, dysuria  and frequency.   Musculoskeletal: Negative for arthralgias.  Skin: Negative for itching and rash.  Neurological: Negative for light-headedness and numbness.  Hematological: Negative for adenopathy. Does not bruise/bleed easily.  Psychiatric/Behavioral: Negative for confusion.    Performance status (ECOG): 1 - Symptomatic but completely ambulatory  Vital Signs BP (!) 114/57   Pulse (!) 45   Temp (!) 96.9 F (36.1 C)   Resp 16   Wt 145 lb 4.8 oz (65.9 kg)   BMI 20.27 kg/m   Physical Exam Vitals and nursing note reviewed.  Constitutional:      General: He is not in acute distress.    Appearance: He is not diaphoretic.     Comments: Thin, frail appearance  HENT:     Head: Normocephalic and atraumatic.     Nose: Nose normal.     Mouth/Throat:     Dentition: Has dentures.     Pharynx: No oropharyngeal exudate.  Eyes:     General: No scleral icterus.    Pupils: Pupils are equal, round, and reactive to light.  Neck:     Thyroid: No thyromegaly.     Trachea: No tracheal deviation.  Cardiovascular:     Rate and Rhythm: Normal rate and regular rhythm.     Heart sounds: Normal heart sounds. No murmur heard.  No friction rub. No gallop.   Pulmonary:     Effort: Pulmonary effort is normal. No respiratory distress.     Breath sounds: Normal breath sounds. No wheezing or rales.  Chest:     Chest wall: No tenderness.  Abdominal:     General: Bowel sounds are normal. There is no distension.     Palpations: Abdomen is soft. Mass: Large right flank mass: Previously ultrasound consistent with a lipomatous growth fat necrosis and resultant calcification.     Tenderness: There is no abdominal tenderness.  Musculoskeletal:        General: No tenderness. Normal range of motion.     Cervical back: Normal range of motion and neck supple.  Lymphadenopathy:     Cervical: No cervical adenopathy.     Upper Body:     Right upper body: No supraclavicular adenopathy.     Left upper body: No  supraclavicular adenopathy.  Skin:    General: Skin is warm and dry.     Findings: No erythema or rash.  Neurological:  Mental Status: He is alert and oriented to person, place, and time.     Cranial Nerves: No cranial nerve deficit.     Motor: No abnormal muscle tone.     Coordination: Coordination normal.  Psychiatric:        Mood and Affect: Mood and affect normal.    CBC    Component Value Date/Time   WBC 5.3 04/20/2020 1213   RBC 3.36 (L) 04/20/2020 1213   HGB 11.7 (L) 04/20/2020 1213   HCT 33.3 (L) 04/20/2020 1213   PLT 165 04/20/2020 1213   MCV 99.1 04/20/2020 1213   MCH 34.8 (H) 04/20/2020 1213   MCHC 35.1 04/20/2020 1213   RDW 13.0 04/20/2020 1213   LYMPHSABS 1.3 04/20/2020 1213   MONOABS 0.5 04/20/2020 1213   EOSABS 0.0 04/20/2020 1213   BASOSABS 0.0 04/20/2020 1213   CMP Latest Ref Rng & Units 04/20/2020 12/19/2019 08/29/2019  Glucose 70 - 99 mg/dL 102(H) 92 98  BUN 8 - 23 mg/dL 25(H) 22 28(H)  Creatinine 0.61 - 1.24 mg/dL 1.30(H) 1.31(H) 1.50(H)  Sodium 135 - 145 mmol/L 141 141 138  Potassium 3.5 - 5.1 mmol/L 3.9 4.0 3.7  Chloride 98 - 111 mmol/L 107 106 108  CO2 22 - 32 mmol/L _0 Calcium 8.9 - 10.3 mg/dL 9.1 9.0 9.0  Total Protein 6.5 - 8.1 g/dL 7.0 6.4(L) 6.3(L)  Total Bilirubin 0.3 - 1.2 mg/dL 1.2 1.2 1.3(H)  Alkaline Phos 38 - 126 U/L 67 71 71  AST 15 - 41 U/L 12(L) 12(L) 15  ALT 0 - 44 U/L _1 Assessment and Plan 86 y.o. male present for management of prostate cancer and vitamin B12 deficiency. 1. Prostate cancer ([place])   2. B12 deficiency   3. Encounter for antineoplastic chemotherapy   4. Anemia due to stage 3a chronic kidney disease   5. 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  Today's PSA is pending Continue current regimen. Continue omeprazole for GI prophylaxis.  Androgen deprivation therapy, patient  will receive Lupron 22.5 mg today.  #Androgen deprivation therapy, Lupron 22.5 mg will be given around 01/18/2020. #Pernicious anemia Vitamin B12 deficiency,B12 levels pending today proceed with parenteral vitamin B12 injection monthly. #Anemia  hemoglobin has decreased to 11.7., multifactorial.  Secondary to CKD, vitamin B12 deficiency. #Weight loss, his weight fluctuates.  Etiology unknown.  Recommend patient to take nutrition supplements.  I will check TSH at the next visit. Follow-up in 3 months. Orders Placed This Encounter  Procedures  . CBC with Differential/Platelet    Standing Status:   Future    Standing Expiration Date:   04/20/2021  . Comprehensive metabolic panel    Standing Status:   Future    Standing Expiration Date:   04/20/2021  . Vitamin B12    Standing Status:   Future    Standing Expiration Date:   04/20/2021  . PSA    Standing Status:   Future    Standing Expiration Date:   04/20/2021    We spent sufficient time to discuss many aspect of care, questions were answered to patient's satisfaction.   Earlie Server, MD, PhD 04/20/2020

## 2020-04-21 ENCOUNTER — Telehealth: Payer: Self-pay

## 2020-04-21 ENCOUNTER — Other Ambulatory Visit: Payer: Self-pay | Admitting: Oncology

## 2020-04-21 DIAGNOSIS — C61 Malignant neoplasm of prostate: Secondary | ICD-10-CM

## 2020-04-21 NOTE — Telephone Encounter (Signed)
Done.. Vitamin B12 mth x2 appts were scheduled as requested. I was unable to reach pt by phone. A NEW appt reminder letter will be mailed out.

## 2020-04-21 NOTE — Telephone Encounter (Signed)
Please schedule and notify pt of appts. Pt last had B12 inj on 8/31.

## 2020-04-21 NOTE — Telephone Encounter (Signed)
-----   Message from Charles Server, MD sent at 04/20/2020  4:47 PM EDT ----- Please arrange patient to have monthly vitamin B12 x2

## 2020-04-27 MED FILL — ABIRATERONE ACETATE 250 MG: 250 | 30 days supply | Qty: 90 | Fill #0

## 2020-04-27 MED FILL — predniSONE 5 MG TABS: 5 | 30 days supply | Qty: 60 | Fill #3

## 2020-05-18 ENCOUNTER — Other Ambulatory Visit: Payer: Self-pay

## 2020-05-18 ENCOUNTER — Inpatient Hospital Stay: Payer: Medicare Other | Attending: Oncology

## 2020-05-18 DIAGNOSIS — E538 Deficiency of other specified B group vitamins: Secondary | ICD-10-CM | POA: Diagnosis not present

## 2020-05-18 DIAGNOSIS — D519 Vitamin B12 deficiency anemia, unspecified: Secondary | ICD-10-CM

## 2020-05-18 MED ORDER — CYANOCOBALAMIN 1000 MCG/ML IJ SOLN
1000.0000 ug | Freq: Once | INTRAMUSCULAR | Status: AC
  Administered 2020-05-18: 1000 ug via INTRAMUSCULAR
  Filled 2020-05-18: qty 1

## 2020-05-26 MED FILL — predniSONE 5 MG TABS: 5 | 30 days supply | Qty: 60 | Fill #4

## 2020-05-26 MED FILL — ABIRATERONE ACETATE 250 MG: 250 | 30 days supply | Qty: 90 | Fill #1

## 2020-06-15 ENCOUNTER — Other Ambulatory Visit: Payer: Self-pay

## 2020-06-15 ENCOUNTER — Inpatient Hospital Stay: Payer: Medicare Other | Attending: Oncology

## 2020-06-15 DIAGNOSIS — D519 Vitamin B12 deficiency anemia, unspecified: Secondary | ICD-10-CM

## 2020-06-15 DIAGNOSIS — E538 Deficiency of other specified B group vitamins: Secondary | ICD-10-CM | POA: Insufficient documentation

## 2020-06-15 MED ORDER — CYANOCOBALAMIN 1000 MCG/ML IJ SOLN
1000.0000 ug | Freq: Once | INTRAMUSCULAR | Status: AC
  Administered 2020-06-15: 1000 ug via INTRAMUSCULAR
  Filled 2020-06-15: qty 1

## 2020-06-21 MED FILL — ABIRATERONE ACETATE 250 MG: 250 | 30 days supply | Qty: 90 | Fill #2

## 2020-06-21 MED FILL — predniSONE 5 MG TABS: 5 | 30 days supply | Qty: 60 | Fill #5

## 2020-07-16 ENCOUNTER — Other Ambulatory Visit: Payer: Self-pay | Admitting: Oncology

## 2020-07-16 DIAGNOSIS — C61 Malignant neoplasm of prostate: Secondary | ICD-10-CM

## 2020-07-19 ENCOUNTER — Inpatient Hospital Stay: Payer: Medicare Other | Attending: Oncology

## 2020-07-19 ENCOUNTER — Other Ambulatory Visit: Payer: Self-pay | Admitting: Oncology

## 2020-07-19 DIAGNOSIS — D631 Anemia in chronic kidney disease: Secondary | ICD-10-CM | POA: Insufficient documentation

## 2020-07-19 DIAGNOSIS — N1831 Chronic kidney disease, stage 3a: Secondary | ICD-10-CM | POA: Diagnosis not present

## 2020-07-19 DIAGNOSIS — R634 Abnormal weight loss: Secondary | ICD-10-CM

## 2020-07-19 DIAGNOSIS — C61 Malignant neoplasm of prostate: Secondary | ICD-10-CM | POA: Diagnosis present

## 2020-07-19 LAB — COMPREHENSIVE METABOLIC PANEL
ALT: 9 U/L (ref 0–44)
AST: 13 U/L — ABNORMAL LOW (ref 15–41)
Albumin: 3.9 g/dL (ref 3.5–5.0)
Alkaline Phosphatase: 63 U/L (ref 38–126)
Anion gap: 11 (ref 5–15)
BUN: 27 mg/dL — ABNORMAL HIGH (ref 8–23)
CO2: 26 mmol/L (ref 22–32)
Calcium: 9.5 mg/dL (ref 8.9–10.3)
Chloride: 104 mmol/L (ref 98–111)
Creatinine, Ser: 1.39 mg/dL — ABNORMAL HIGH (ref 0.61–1.24)
GFR, Estimated: 48 mL/min — ABNORMAL LOW (ref >60)
Glucose, Bld: 105 mg/dL — ABNORMAL HIGH (ref 70–99)
Potassium: 4 mmol/L (ref 3.5–5.1)
Sodium: 141 mmol/L (ref 135–145)
Total Bilirubin: 1.1 mg/dL (ref 0.3–1.2)
Total Protein: 7 g/dL (ref 6.5–8.1)

## 2020-07-19 LAB — CBC WITH DIFFERENTIAL/PLATELET
Abs Immature Granulocytes: 0.01 10*3/uL (ref 0.00–0.07)
Basophils Absolute: 0 10*3/uL (ref 0.0–0.1)
Basophils Relative: 0 %
Eosinophils Absolute: 0 10*3/uL (ref 0.0–0.5)
Eosinophils Relative: 1 %
HCT: 34.1 % — ABNORMAL LOW (ref 39.0–52.0)
Hemoglobin: 11.6 g/dL — ABNORMAL LOW (ref 13.0–17.0)
Immature Granulocytes: 0 %
Lymphocytes Relative: 27 %
Lymphs Abs: 1.3 10*3/uL (ref 0.7–4.0)
MCH: 34.1 pg — ABNORMAL HIGH (ref 26.0–34.0)
MCHC: 34 g/dL (ref 30.0–36.0)
MCV: 100.3 fL — ABNORMAL HIGH (ref 80.0–100.0)
Monocytes Absolute: 0.4 10*3/uL (ref 0.1–1.0)
Monocytes Relative: 10 %
Neutro Abs: 2.9 10*3/uL (ref 1.7–7.7)
Neutrophils Relative %: 62 %
Platelets: 175 10*3/uL (ref 150–400)
RBC: 3.4 MIL/uL — ABNORMAL LOW (ref 4.22–5.81)
RDW: 12.8 % (ref 11.5–15.5)
WBC: 4.6 10*3/uL (ref 4.0–10.5)
nRBC: 0 % (ref 0.0–0.2)

## 2020-07-19 LAB — VITAMIN B12: Vitamin B-12: 420 pg/mL (ref 180–914)

## 2020-07-19 LAB — TSH: TSH: 4.604 u[IU]/mL — ABNORMAL HIGH (ref 0.350–4.500)

## 2020-07-19 LAB — PSA: Prostatic Specific Antigen: 0.77 ng/mL (ref 0.00–4.00)

## 2020-07-21 ENCOUNTER — Ambulatory Visit: Payer: Medicare Other | Admitting: Oncology

## 2020-07-21 ENCOUNTER — Encounter: Payer: Medicare Other | Admitting: Hospice and Palliative Medicine

## 2020-07-21 ENCOUNTER — Ambulatory Visit: Payer: Medicare Other

## 2020-07-22 MED FILL — ABIRATERONE ACETATE 250 MG: 250 | 30 days supply | Qty: 90 | Fill #0

## 2020-07-22 MED FILL — predniSONE 5 MG TABS: 5 | 30 days supply | Qty: 60 | Fill #0

## 2020-07-28 ENCOUNTER — Encounter: Payer: Self-pay | Admitting: Oncology

## 2020-07-28 ENCOUNTER — Ambulatory Visit
Admission: RE | Admit: 2020-07-28 | Discharge: 2020-07-28 | Disposition: A | Discharge status: Home / Self Care | Payer: Medicare Other | Source: Ambulatory Visit | Attending: Oncology | Admitting: Oncology

## 2020-07-28 ENCOUNTER — Other Ambulatory Visit: Payer: Self-pay

## 2020-07-28 ENCOUNTER — Inpatient Hospital Stay: Payer: Medicare Other

## 2020-07-28 ENCOUNTER — Inpatient Hospital Stay (HOSPITAL_BASED_OUTPATIENT_CLINIC_OR_DEPARTMENT_OTHER): Payer: Medicare Other | Admitting: Hospice and Palliative Medicine

## 2020-07-28 ENCOUNTER — Inpatient Hospital Stay: Payer: Medicare Other | Attending: Oncology | Admitting: Oncology

## 2020-07-28 VITALS — BP 146/77 | HR 86 | Temp 96.5°F | Resp 16 | Wt 145.5 lb

## 2020-07-28 DIAGNOSIS — Z515 Encounter for palliative care: Secondary | ICD-10-CM

## 2020-07-28 DIAGNOSIS — E538 Deficiency of other specified B group vitamins: Secondary | ICD-10-CM | POA: Insufficient documentation

## 2020-07-28 DIAGNOSIS — Z79899 Other long term (current) drug therapy: Secondary | ICD-10-CM | POA: Insufficient documentation

## 2020-07-28 DIAGNOSIS — C61 Malignant neoplasm of prostate: Secondary | ICD-10-CM

## 2020-07-28 DIAGNOSIS — D631 Anemia in chronic kidney disease: Secondary | ICD-10-CM

## 2020-07-28 DIAGNOSIS — D649 Anemia, unspecified: Secondary | ICD-10-CM

## 2020-07-28 DIAGNOSIS — D519 Vitamin B12 deficiency anemia, unspecified: Secondary | ICD-10-CM

## 2020-07-28 DIAGNOSIS — N1831 Chronic kidney disease, stage 3a: Secondary | ICD-10-CM | POA: Diagnosis not present

## 2020-07-28 DIAGNOSIS — Z5111 Encounter for antineoplastic chemotherapy: Secondary | ICD-10-CM | POA: Diagnosis not present

## 2020-07-28 DIAGNOSIS — R911 Solitary pulmonary nodule: Secondary | ICD-10-CM | POA: Diagnosis not present

## 2020-07-28 LAB — T4, FREE: Free T4: 0.9 ng/dL (ref 0.61–1.12)

## 2020-07-28 MED ORDER — LEUPROLIDE ACETATE (3 MONTH) 22.5 MG ~~LOC~~ KIT
22.5000 mg | PACK | Freq: Once | SUBCUTANEOUS | Status: AC
  Administered 2020-07-28: 22.5 mg via SUBCUTANEOUS
  Filled 2020-07-28: qty 22.5

## 2020-07-28 MED ORDER — CYANOCOBALAMIN 1000 MCG/ML IJ SOLN
1000.0000 ug | Freq: Once | INTRAMUSCULAR | Status: AC
  Administered 2020-07-28: 1000 ug via INTRAMUSCULAR
  Filled 2020-07-28: qty 1

## 2020-07-28 NOTE — Progress Notes (Signed)
Patient denies new problems/concerns today.   °

## 2020-07-28 NOTE — Progress Notes (Signed)
[facility] day:  07/28/2020    Chief Complaint: Charles Flores is an 86 y.o. male with prostate cancer and B12 deficiency who is seen for  3 month assessment.  Pertinent oncology history Patient previously follows up with Dr. Mike Gip.  He switched care to me on 09/05/2018. The patient is a 86 year old African American gentleman with prostate cancer and a rising PSA on Lupron.  Prostate cancer was discovered approximately 20 years secondary to an elevated PSA.  He underwent biopsy at [facility] (no report available). Initial PSA was 774.   I have personally reviewed the radiological images as listed and agreed with the findings in the report. Abdomen and pelvic CT on 01/05/2006 revealed an enlarged and heterogeneous prostate with mass effect on the base of the bladder.  There was no adenopathy.  Chest, abdomen, and pelvic CT on 03/15/2016 revealed moderate centrilobular emphysema.  There were mild lucent mottled appearance of the osseous structures (cannot exclude multiple myeloma).  There were scattered pulmonary nodules (4 mm or less in size).  There was a 2.7 cm low-attenuation left thyroid nodule.    Abdomen and pelvic CT on 12/07/2016 revealed no new or progressive findings.  There was no evidence of lymphadenopathy in the abdomen or pelvis. There was a stable appearance of heterogeneous/mottled bony mineralization.  Abdomen and pelvic CT on 11/27/2017 was stable with no evidence of adenopathy within the abdomen and pelvis.  There were no suspicious bone lesions.  There was a stable 3 mm RLL nodule.  Work-up on 03/17/2016 revealed no monoclonal protein.  SPEP revealed no monoclonal protein.  Kappa free light chains were 28.9, lambda free light chains 20.7 with a ratio of 1.40 (normal).  Immunoglobulins were normal (IgG 1391, IgA 282, IgM 29).  24 hour urine revealed no monoclonal protein. Thyroid ultrasound on 03/22/2016 revealed a  solitary 2.9 cm left lower pole nodule.  Thyroid biopsy on 04/14/2016 was benign (Bethesda category II) with abundant foamy macrophages and colloid. The cytologic findings were compatible with a cystic colloid nodule  Bone scan on 04/08/2014, 12/29/2014, 02/23/2016, 12/07/2016, and 11/27/2017 revealed no evidence of metastatic disease.  Patient has been on Lupron and Casodex.  He receives Lupron 30 mg every 4 months (last 12/06/2017).    PSA has been followed: 127.6 on 06/30/2014, 168.3 on 12/02/2014, 63.32 on 02/01/2015, 40.31 on 03/03/2015, 29.12 on 04/08/2015, 11.28 on 07/14/2015, 11.02 on 08/17/2015, 10.35 on 11/15/2015, 14.37 on 02/15/2016, 12.46 on 06/06/2016, 13.5 on 06/23/2016, 16.63 on 09/05/2016, 20.77 on 11/07/2016, 16.41 on 12/11/2016, 26.69 on 02/27/2017, 25.67 on 04/02/2017, 19.16 on 05/10/2017, 18.94 on 07/02/2017, 28.28 on 10/01/2017, 30.23 on 12/06/2017, and 34.56 on 03/14/2018.  He has a normocytic anemia. He was diagnosed with B12 deficiency on 12/24/2014. B12 was 169 (low).  He began B12 supplimentation on 02/01/2015 (last 11/15/2017).  Parietal cell antibody was positive on 07/02/2017.  Folate was 11 on 12/24/2014 and 12 on 06/06/2016.  Normal studies included a CMP, ferritin, iron studies, folate, and TSH. TSH and free T4 were normal on 04/02/2017.   # Limited abdominal ultrasound was done on 03/14/2018 revealed a 21.8 x 8.3 x 10.2 cm fatty mass with internal calcifications.  Findings felt to correspond to the CT findings on 11/2017, and not significantly changed from 12/2015 scan.  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  Neoplasm felt to be unlikely.  # chronic bilateral lower extremity ankle edema, ultrasound  showed negative for DVT. #Chronic hypotension on midodrine  INTERVAL HISTORY Charles Flores is a 86 y.o. male who has above history reviewed by me today presents for follow up visit for management of prostate cancer. Patient was  accompanied by her nephew. Patient reports having shortness of breath with exertion. Appetite is fair, weight has been stable since last visit. Patient takes Zytiga 750 mg daily as well as prednisone 5 mg twice daily. Denies any pain today.    Past Medical History:  Diagnosis Date  . Anemia 12/24/2014  . Arthritis   . B12 deficiency anemia 01/01/2015  . Cataract   . Coronary artery disease   . GERD (gastroesophageal reflux disease)   . Hyperlipidemia   . Hyperlipidemia   . Prostate cancer ([place])   . Renal insufficiency 04/15/2017  . Thyroid nodule 03/14/2016    Past Surgical History:  Procedure Laterality Date  . PROSTATE BIOPSY      Family History  Problem Relation Age of Onset  . Diabetes Sister   . Stroke Sister   . Gastric cancer Sister   . Cancer Mother   . Breast cancer Mother   . Kidney disease Father    Social History   Socioeconomic History  . Marital status: Single    Spouse name: Not on file  . Number of children: Not on file  . Years of education: Not on file  . Highest education level: Not on file  Occupational History  . Not on file  Tobacco Use  . Smoking status: Former Smoker    Packs/day: 0.50    Years: 15.00    Pack years: 7.50    Types: Cigarettes  . Smokeless tobacco: Never Used  . Tobacco comment: quit 40 years  Vaping Use  . Vaping Use: Never used  Substance and Sexual Activity  . Alcohol use: No    Alcohol/week: 0.0 standard drinks  . Drug use: No  . Sexual activity: Yes    Partners: Male  Other Topics Concern  . Not on file  Social History Narrative  . Not on file   Social Determinants of Health   Financial Resource Strain:   . Difficulty of Paying Living Expenses: Not on file  Food Insecurity:   . Worried About Charity fundraiser in the Last Year: Not on file  . Ran Out of Food in the Last Year: Not on file  Transportation Needs:   . Lack of Transportation (Medical): Not on file  . Lack of Transportation (Non-Medical):  Not on file  Physical Activity:   . Days of Exercise per Week: Not on file  . Minutes of Exercise per Session: Not on file  Stress:   . Feeling of Stress : Not on file  Social Connections:   . Frequency of Communication with Friends and Family: Not on file  . Frequency of Social Gatherings with Friends and Family: Not on file  . Attends Religious Services: Not on file  . Active Member of Clubs or Organizations: Not on file  . Attends Archivist Meetings: Not on file  . Marital Status: Not on file  Intimate Partner Violence:   . Fear of Current or Ex-Partner: Not on file  . Emotionally Abused: Not on file  . Physically Abused: Not on file  . Sexually Abused: Not on file     Allergies: No Known Allergies  Current Outpatient Medications  Medication Sig Dispense Refill  . abiraterone acetate (ZYTIGA) 250 MG tablet TAKE 3 TABLETS  BY MOUTH DAILY. TAKE ON AN EMPTY STOMACH 1 HOUR BEFORE OR 2 HOURS AFTER A MEAL 90 tablet 2  . midodrine (PROAMATINE) 2.5 MG tablet TAKE 1 TABLET (2.5 MG TOTAL) BY MOUTH 3 (THREE) TIMES DAILY WITH MEALS. 270 tablet 2  . predniSONE (DELTASONE) 5 MG tablet TAKE 1 TABLET BY MOUTH TWO TIMES DAILY WITH A MEAL 60 tablet 0  . omeprazole (PRILOSEC) 20 MG capsule TAKE 1 CAPSULE BY MOUTH EVERY DAY (Patient not taking: Reported on 07/28/2020) 90 capsule 1   No current facility-administered medications for this visit.   Review of Systems  Constitutional: Positive for fatigue. Negative for appetite change, chills, fever and unexpected weight change.  HENT:   Negative for hearing loss and voice change.   Eyes: Negative for eye problems and icterus.  Respiratory: Positive for shortness of breath. Negative for chest tightness and cough.   Cardiovascular: Positive for leg swelling. Negative for chest pain.  Gastrointestinal: Negative for abdominal distention and abdominal pain.  Endocrine: Negative for hot flashes.  Genitourinary: Negative for difficulty urinating,  dysuria and frequency.   Musculoskeletal: Negative for arthralgias.  Skin: Negative for itching and rash.  Neurological: Negative for light-headedness and numbness.  Hematological: Negative for adenopathy. Does not bruise/bleed easily.  Psychiatric/Behavioral: Negative for confusion.    Performance status (ECOG): 1 - Symptomatic but completely ambulatory  Vital Signs BP (!) 146/77   Pulse 86   Temp (!) 96.5 F (35.8 C)   Resp 16   Wt 145 lb 8 oz (66 kg)   BMI 20.29 kg/m   Physical Exam Vitals and nursing note reviewed.  Constitutional:      General: He is not in acute distress.    Appearance: He is not diaphoretic.     Comments: Thin, frail appearance  HENT:     Head: Normocephalic and atraumatic.     Nose: Nose normal.     Mouth/Throat:     Dentition: Has dentures.     Pharynx: No oropharyngeal exudate.  Eyes:     General: No scleral icterus.    Pupils: Pupils are equal, round, and reactive to light.  Neck:     Thyroid: No thyromegaly.     Trachea: No tracheal deviation.  Cardiovascular:     Rate and Rhythm: Normal rate and regular rhythm.     Heart sounds: Normal heart sounds. No murmur heard.  No friction rub. No gallop.   Pulmonary:     Effort: Pulmonary effort is normal. No respiratory distress.     Breath sounds: No wheezing or rales.     Comments: Decreased breath sound bilaterally Chest:     Chest wall: No tenderness.  Abdominal:     General: Bowel sounds are normal. There is no distension.     Palpations: Abdomen is soft. Mass: Large right flank mass: Previously ultrasound consistent with a lipomatous growth fat necrosis and resultant calcification.     Tenderness: There is no abdominal tenderness.  Musculoskeletal:        General: No tenderness. Normal range of motion.     Cervical back: Normal range of motion and neck supple.  Lymphadenopathy:     Cervical: No cervical adenopathy.     Upper Body:     Right upper body: No supraclavicular adenopathy.      Left upper body: No supraclavicular adenopathy.  Skin:    General: Skin is warm and dry.     Findings: No erythema or rash.  Neurological:  Mental Status: He is alert and oriented to person, place, and time.     Cranial Nerves: No cranial nerve deficit.     Motor: No abnormal muscle tone.     Coordination: Coordination normal.  Psychiatric:        Mood and Affect: Mood and affect normal.    CBC    Component Value Date/Time   WBC 4.6 07/19/2020 1331   RBC 3.40 (L) 07/19/2020 1331   HGB 11.6 (L) 07/19/2020 1331   HCT 34.1 (L) 07/19/2020 1331   PLT 175 07/19/2020 1331   MCV 100.3 (H) 07/19/2020 1331   MCH 34.1 (H) 07/19/2020 1331   MCHC 34.0 07/19/2020 1331   RDW 12.8 07/19/2020 1331   LYMPHSABS 1.3 07/19/2020 1331   MONOABS 0.4 07/19/2020 1331   EOSABS 0.0 07/19/2020 1331   BASOSABS 0.0 07/19/2020 1331   CMP Latest Ref Rng & Units 07/19/2020 04/20/2020 12/19/2019  Glucose 70 - 99 mg/dL 105(H) 102(H) 92  BUN 8 - 23 mg/dL 27(H) 25(H) 22  Creatinine 0.61 - 1.24 mg/dL 1.39(H) 1.30(H) 1.31(H)  Sodium 135 - 145 mmol/L 141 141 141  Potassium 3.5 - 5.1 mmol/L 4.0 3.9 4.0  Chloride 98 - 111 mmol/L 104 107 106  CO2 22 - 32 mmol/L _0 Calcium 8.9 - 10.3 mg/dL 9.5 9.1 9.0  Total Protein 6.5 - 8.1 g/dL 7.0 7.0 6.4(L)  Total Bilirubin 0.3 - 1.2 mg/dL 1.1 1.2 1.2  Alkaline Phos 38 - 126 U/L 63 67 71  AST 15 - 41 U/L 13(L) 12(L) 12(L)  ALT 0 - 44 U/L _1 Assessment and Plan 86 y.o. male present for management of prostate cancer and vitamin B12 deficiency. 1. Normocytic anemia   2. B12 deficiency   3. Encounter for antineoplastic chemotherapy   4. Anemia due to stage 3a chronic kidney disease ([place])   5. 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   Most recent PSA is 0.77.  Stable. Continue androgen deprivation therapy, proceed with  Lupron 22.5 mg today. Continue Zytiga 750 mg with prednisone 5 mg twice daily. Continue GI prophylaxis with omeprazole.  #Androgen deprivation therapy, Lupron 22.5 mg will be given around 01/18/2020. #Pernicious anemia Vitamin B12 deficiency, Proceed with vitamin B12 injection today.  Check B12 the next visit.  #Anemia hemoglobin has been stable. # TSH is mildly elevated.  Add free T4-which is normal.  Monitor. #Shortness of breath, I will check chest x-ray for further evaluation. Follow-up in 3 months. Orders Placed This Encounter  Procedures  . DG Chest 2 View    Standing Status:   Future    Number of Occurrences:   1    Standing Expiration Date:   07/28/2021    Order Specific Question:   Reason for exam:    Answer:   shortness of breath    Order Specific Question:   Preferred imaging location?    Answer:   [facility]  . T4, free    Standing Status:   Future    Number of Occurrences:   1    Standing Expiration Date:   07/28/2021    We spent sufficient time to discuss many aspect of care, questions were answered to patient's satisfaction.   Earlie Server, MD, PhD 07/28/2020

## 2020-07-28 NOTE — Progress Notes (Signed)
[place]  Telephone:([phone] [phone]   Name: Charles Flores Date: 07/28/2020 MRN: [id number]  DOB: [birth year]  Patient Care Team: Ronnell Freshwater, NP as PCP - General (Family Medicine) Collier Flowers, MD as Referring Physician (Urology)    REASON FOR CONSULTATION: Charles Flores is a 86 y.o. male with multiple medical problems including castration resistant prostate cancer on Lupron, chronic lower extremity edema, B12 deficiency, and anemia.  Patient was referred to palliative care by medical oncology to establish goals and provide ongoing support.  SOCIAL HISTORY:     reports that he has quit smoking. His smoking use included cigarettes. He has a 7.50 pack-year smoking history. He has never used smokeless tobacco. He reports that he does not drink alcohol and does not use drugs.   Patient is unmarried.  He has no children.  He lives at home with his nephew.  He also has a niece who is involved in his care.  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  ADVANCE DIRECTIVES:  Does not have  CODE STATUS: DNR/DNI (MOST Form completed on 09/18/2019)  PAST MEDICAL HISTORY: Past Medical History:  Diagnosis Date  . Anemia 12/24/2014  . Arthritis   . B12 deficiency anemia 01/01/2015  . Cataract   . Coronary artery disease   . GERD (gastroesophageal reflux disease)   . Hyperlipidemia   . Hyperlipidemia   . Prostate cancer ([place])   . Renal insufficiency 04/15/2017  . Thyroid nodule 03/14/2016    PAST SURGICAL HISTORY:  Past Surgical History:  Procedure Laterality Date  . PROSTATE BIOPSY      HEMATOLOGY/ONCOLOGY HISTORY:  Oncology History   No history exists.    ALLERGIES:  has No Known Allergies.  MEDICATIONS:  Current Outpatient Medications  Medication Sig Dispense Refill  . abiraterone acetate (ZYTIGA) 250 MG tablet TAKE 3 TABLETS BY MOUTH DAILY. TAKE ON AN EMPTY STOMACH 1 HOUR BEFORE OR 2 HOURS AFTER A MEAL 90 tablet  2  . midodrine (PROAMATINE) 2.5 MG tablet TAKE 1 TABLET (2.5 MG TOTAL) BY MOUTH 3 (THREE) TIMES DAILY WITH MEALS. 270 tablet 2  . omeprazole (PRILOSEC) 20 MG capsule TAKE 1 CAPSULE BY MOUTH EVERY DAY (Patient not taking: Reported on 07/28/2020) 90 capsule 1  . predniSONE (DELTASONE) 5 MG tablet TAKE 1 TABLET BY MOUTH TWO TIMES DAILY WITH A MEAL 60 tablet 0   No current facility-administered medications for this visit.    VITAL SIGNS: There were no vitals taken for this visit. There were no vitals filed for this visit.  Estimated body mass index is 20.29 kg/m as calculated from the following:   Height as of 11/12/19: 5\' 11"  (1.803 m).   Weight as of an earlier encounter on 07/28/20: 145 lb 8 oz (66 kg).  LABS: CBC:    Component Value Date/Time   WBC 4.6 07/19/2020 1331   HGB 11.6 (L) 07/19/2020 1331   HCT 34.1 (L) 07/19/2020 1331   PLT 175 07/19/2020 1331   MCV 100.3 (H) 07/19/2020 1331   NEUTROABS 2.9 07/19/2020 1331   LYMPHSABS 1.3 07/19/2020 1331   MONOABS 0.4 07/19/2020 1331   EOSABS 0.0 07/19/2020 1331   BASOSABS 0.0 07/19/2020 1331   Comprehensive Metabolic Panel:    Component Value Date/Time   NA 141 07/19/2020 1331   K 4.0 07/19/2020 1331   CL 104 07/19/2020 1331   CO2 26 07/19/2020 1331   BUN 27 (H) 07/19/2020 1331   CREATININE 1.39 (H)  07/19/2020 1331   GLUCOSE 105 (H) 07/19/2020 1331   CALCIUM 9.5 07/19/2020 1331   AST 13 (L) 07/19/2020 1331   ALT 9 07/19/2020 1331   ALKPHOS 63 07/19/2020 1331   BILITOT 1.1 07/19/2020 1331   PROT 7.0 07/19/2020 1331   ALBUMIN 3.9 07/19/2020 1331    RADIOGRAPHIC STUDIES: No results found.  PERFORMANCE STATUS (ECOG) : 1 - Symptomatic but completely ambulatory  Review of Systems Unless otherwise noted, a complete review of systems is negative.  Physical Exam General: NAD, frail appearing, thin Pulmonary: Unlabored Extremities: no edema, no joint deformities Skin: no rashes Neurological: Weakness but otherwise  nonfocal  IMPRESSION: Routine follow-up visit today with patient.  He was accompanied by his nephew.  Patient reports he is doing well.  He denies any acute changes or concerns.  No symptomatic complaints at present.  He denies falls or changes in performance status.  He reports stable appetite.  Weight also appears stable.  No issues with with medications no need for refills.  PLAN: -Continue current scope of treatment -RTC in 4-6 months  Case and plan discussed with Dr. Tasia Catchings  Patient expressed understanding and was in agreement with this plan. He also understands that He can call the clinic at any time with any questions, concerns, or complaints.     Time Total: 15 minutes  Visit consisted of counseling and education dealing with the complex and emotionally intense issues of symptom management and palliative care in the setting of serious and potentially life-threatening illness.Greater than 50%  of this time was spent counseling and coordinating care related to the above assessment and plan.  Signed by: Altha Harm, PhD, NP-C

## 2020-07-29 ENCOUNTER — Telehealth: Payer: Self-pay

## 2020-07-29 NOTE — Telephone Encounter (Signed)
-----   Message from Earlie Server, MD sent at 07/28/2020  8:57 PM EST ----- My bad. patient needs to have vitamin B12 injections at next visit.  He used to do B12 monthly.  With his age and other medical problems, I will see if B12 every 3 months is feasible or not.

## 2020-07-29 NOTE — Telephone Encounter (Signed)
Please add B12 injection to next appt in 3 months. Thanks

## 2020-07-29 NOTE — Telephone Encounter (Signed)
Done... B12 injection has been added to pts 10/26/20 appts as requested

## 2020-07-30 ENCOUNTER — Telehealth: Payer: Self-pay

## 2020-07-30 ENCOUNTER — Telehealth: Payer: Self-pay | Admitting: *Deleted

## 2020-07-30 ENCOUNTER — Other Ambulatory Visit: Payer: Self-pay

## 2020-07-30 DIAGNOSIS — R9389 Abnormal findings on diagnostic imaging of other specified body structures: Secondary | ICD-10-CM

## 2020-07-30 NOTE — Telephone Encounter (Signed)
Called report  IMPRESSION: New nodular density seen laterally in left lung base. CT scan of the chest is recommended for further evaluation. These results will be called to the ordering clinician or representative by the Radiologist Assistant, and communication documented in the PACS or zVision Dashboard.  Hyperexpansion of the lungs is noted consistent with COPD.   Electronically Signed   By: Marijo Conception M.D.   On: 07/29/2020 16:03

## 2020-07-30 NOTE — Telephone Encounter (Signed)
-----   Message from Earlie Server, MD sent at 07/29/2020  7:20 PM EST ----- Please arrange him to get CT chest wo for further evlauation

## 2020-07-30 NOTE — Telephone Encounter (Signed)
Please schedule CT. Will call pt with appt once scheduled. Thanks

## 2020-07-30 NOTE — Telephone Encounter (Signed)
Done  CT has been sched as requested. CT is sched for 08/10/20 @ 4:30 at [facility] Pt must arrive by 4pm.

## 2020-07-30 NOTE — Telephone Encounter (Signed)
Contacted patient's nephew and he wrote down appt information. Patient was present as well. Will mail appt reminder.

## 2020-08-10 ENCOUNTER — Ambulatory Visit: Payer: Medicare Other

## 2020-08-16 ENCOUNTER — Ambulatory Visit: Payer: Medicare Other

## 2020-08-17 ENCOUNTER — Other Ambulatory Visit: Payer: Self-pay | Admitting: Oncology

## 2020-08-17 DIAGNOSIS — C61 Malignant neoplasm of prostate: Secondary | ICD-10-CM

## 2020-08-17 MED FILL — predniSONE 5 MG TABS: 5 | 30 days supply | Qty: 60 | Fill #0

## 2020-08-25 ENCOUNTER — Other Ambulatory Visit: Payer: Self-pay

## 2020-08-25 ENCOUNTER — Telehealth: Payer: Self-pay

## 2020-08-25 ENCOUNTER — Ambulatory Visit
Admission: RE | Admit: 2020-08-25 | Discharge: 2020-08-25 | Disposition: A | Discharge status: Home / Self Care | Payer: Medicare Other | Source: Ambulatory Visit | Attending: Oncology | Admitting: Oncology

## 2020-08-25 DIAGNOSIS — R0602 Shortness of breath: Secondary | ICD-10-CM | POA: Diagnosis not present

## 2020-08-25 DIAGNOSIS — R9389 Abnormal findings on diagnostic imaging of other specified body structures: Secondary | ICD-10-CM | POA: Insufficient documentation

## 2020-08-25 NOTE — Telephone Encounter (Signed)
-----   Message from Rickard Patience, MD sent at 08/25/2020  4:57 PM EST ----- Let him know that CT chest result showed no acute process. If more SOB, ask him to contact PCP Keep current follow up plan.

## 2020-08-25 NOTE — Telephone Encounter (Signed)
Pt notified and voiced understanding 

## 2020-08-31 MED FILL — ABIRATERONE ACETATE 250 MG: 250 | 30 days supply | Qty: 90 | Fill #1

## 2020-09-22 ENCOUNTER — Other Ambulatory Visit: Payer: Self-pay | Admitting: Pharmacist

## 2020-09-22 ENCOUNTER — Other Ambulatory Visit: Payer: Self-pay | Admitting: Oncology

## 2020-09-22 DIAGNOSIS — C61 Malignant neoplasm of prostate: Secondary | ICD-10-CM

## 2020-09-22 MED ORDER — PREDNISONE 5 MG PO TABS: 5.0000 mg | ORAL_TABLET | Freq: Two times a day (BID) | ORAL | 3 refills | Status: DC

## 2020-09-22 MED FILL — predniSONE 5 MG TABS: 5 | 30 days supply | Qty: 60 | Fill #0

## 2020-09-27 DIAGNOSIS — Z23 Encounter for immunization: Secondary | ICD-10-CM | POA: Diagnosis not present

## 2020-09-27 DIAGNOSIS — Z7189 Other specified counseling: Secondary | ICD-10-CM | POA: Diagnosis not present

## 2020-09-28 ENCOUNTER — Telehealth: Payer: Self-pay | Admitting: Pharmacy Technician

## 2020-09-29 NOTE — Telephone Encounter (Signed)
Oral Oncology Patient Advocate Encounter   Was successful in securing patient an [phone] grant from Patient [place] [facility]) to provide copayment coverage for Zytiga.  This will keep the out of pocket expense at $0.    The billing information is as follows and has been shared with [place].   Member ID: [id number] Group ID: [id number] RxBin: [id number] Dates of Eligibility: 06/30/20 through 09/27/21  Fund:  [place] [facility] [place] Phone [phone] Fax [phone] 09/29/2020 2:05 PM

## 2020-10-20 MED FILL — predniSONE 5 MG TABS: 5 | 30 days supply | Qty: 60 | Fill #1

## 2020-10-20 MED FILL — ABIRATERONE ACETATE 250 MG: 250 | 30 days supply | Qty: 90 | Fill #2

## 2020-10-26 ENCOUNTER — Other Ambulatory Visit: Payer: Self-pay

## 2020-10-26 ENCOUNTER — Inpatient Hospital Stay (HOSPITAL_BASED_OUTPATIENT_CLINIC_OR_DEPARTMENT_OTHER): Payer: Medicare Other | Admitting: Hospice and Palliative Medicine

## 2020-10-26 ENCOUNTER — Inpatient Hospital Stay: Payer: Medicare Other

## 2020-10-26 ENCOUNTER — Encounter: Payer: Self-pay | Admitting: Oncology

## 2020-10-26 ENCOUNTER — Inpatient Hospital Stay (HOSPITAL_BASED_OUTPATIENT_CLINIC_OR_DEPARTMENT_OTHER): Payer: Medicare Other | Admitting: Oncology

## 2020-10-26 ENCOUNTER — Inpatient Hospital Stay: Payer: Medicare Other | Attending: Oncology

## 2020-10-26 VITALS — BP 123/76 | HR 92 | Temp 96.3°F | Resp 16 | Wt 151.4 lb

## 2020-10-26 DIAGNOSIS — E538 Deficiency of other specified B group vitamins: Secondary | ICD-10-CM | POA: Diagnosis not present

## 2020-10-26 DIAGNOSIS — C61 Malignant neoplasm of prostate: Secondary | ICD-10-CM | POA: Diagnosis not present

## 2020-10-26 DIAGNOSIS — D519 Vitamin B12 deficiency anemia, unspecified: Secondary | ICD-10-CM | POA: Diagnosis not present

## 2020-10-26 DIAGNOSIS — Z515 Encounter for palliative care: Secondary | ICD-10-CM | POA: Diagnosis not present

## 2020-10-26 DIAGNOSIS — Z5111 Encounter for antineoplastic chemotherapy: Secondary | ICD-10-CM | POA: Insufficient documentation

## 2020-10-26 DIAGNOSIS — N1831 Chronic kidney disease, stage 3a: Secondary | ICD-10-CM

## 2020-10-26 DIAGNOSIS — D631 Anemia in chronic kidney disease: Secondary | ICD-10-CM

## 2020-10-26 DIAGNOSIS — D649 Anemia, unspecified: Secondary | ICD-10-CM

## 2020-10-26 LAB — COMPREHENSIVE METABOLIC PANEL
ALT: 9 U/L (ref 0–44)
AST: 14 U/L — ABNORMAL LOW (ref 15–41)
Albumin: 3.9 g/dL (ref 3.5–5.0)
Alkaline Phosphatase: 54 U/L (ref 38–126)
Anion gap: 10 (ref 5–15)
BUN: 29 mg/dL — ABNORMAL HIGH (ref 8–23)
CO2: 24 mmol/L (ref 22–32)
Calcium: 9.2 mg/dL (ref 8.9–10.3)
Chloride: 106 mmol/L (ref 98–111)
Creatinine, Ser: 1.35 mg/dL — ABNORMAL HIGH (ref 0.61–1.24)
GFR, Estimated: 50 mL/min — ABNORMAL LOW (ref >60)
Glucose, Bld: 96 mg/dL (ref 70–99)
Potassium: 4 mmol/L (ref 3.5–5.1)
Sodium: 140 mmol/L (ref 135–145)
Total Bilirubin: 1.1 mg/dL (ref 0.3–1.2)
Total Protein: 6.8 g/dL (ref 6.5–8.1)

## 2020-10-26 LAB — PSA: Prostatic Specific Antigen: 1.1 ng/mL (ref 0.00–4.00)

## 2020-10-26 LAB — CBC WITH DIFFERENTIAL/PLATELET
Abs Immature Granulocytes: 0.01 10*3/uL (ref 0.00–0.07)
Basophils Absolute: 0 10*3/uL (ref 0.0–0.1)
Basophils Relative: 1 %
Eosinophils Absolute: 0 10*3/uL (ref 0.0–0.5)
Eosinophils Relative: 1 %
HCT: 33.2 % — ABNORMAL LOW (ref 39.0–52.0)
Hemoglobin: 11.3 g/dL — ABNORMAL LOW (ref 13.0–17.0)
Immature Granulocytes: 0 %
Lymphocytes Relative: 22 %
Lymphs Abs: 0.9 10*3/uL (ref 0.7–4.0)
MCH: 34.6 pg — ABNORMAL HIGH (ref 26.0–34.0)
MCHC: 34 g/dL (ref 30.0–36.0)
MCV: 101.5 fL — ABNORMAL HIGH (ref 80.0–100.0)
Monocytes Absolute: 0.4 10*3/uL (ref 0.1–1.0)
Monocytes Relative: 11 %
Neutro Abs: 2.6 10*3/uL (ref 1.7–7.7)
Neutrophils Relative %: 65 %
Platelets: 175 10*3/uL (ref 150–400)
RBC: 3.27 MIL/uL — ABNORMAL LOW (ref 4.22–5.81)
RDW: 13 % (ref 11.5–15.5)
WBC: 4 10*3/uL (ref 4.0–10.5)
nRBC: 0 % (ref 0.0–0.2)

## 2020-10-26 LAB — VITAMIN B12: Vitamin B-12: 298 pg/mL (ref 180–914)

## 2020-10-26 LAB — FOLATE: Folate: 13.4 ng/mL (ref >5.9)

## 2020-10-26 MED ORDER — CYANOCOBALAMIN 1000 MCG/ML IJ SOLN
1000.0000 ug | Freq: Once | INTRAMUSCULAR | Status: AC
  Administered 2020-10-26: 1000 ug via INTRAMUSCULAR
  Filled 2020-10-26: qty 1

## 2020-10-26 MED ORDER — LEUPROLIDE ACETATE (3 MONTH) 22.5 MG ~~LOC~~ KIT
22.5000 mg | PACK | Freq: Once | SUBCUTANEOUS | Status: AC
  Administered 2020-10-26: 22.5 mg via SUBCUTANEOUS
  Filled 2020-10-26: qty 22.5

## 2020-10-26 NOTE — Progress Notes (Signed)
[facility] day:  10/26/2020    Chief Complaint: Charles Flores is an 87 y.o. male with prostate cancer and B12 deficiency who is seen for  3 month assessment.  Pertinent oncology history Patient previously follows up with Dr. Mike Gip.  He switched care to me on 09/05/2018. The patient is a 87 year old African American gentleman with prostate cancer and a rising PSA on Lupron.  Prostate cancer was discovered approximately 20 years secondary to an elevated PSA.  He underwent biopsy at [facility] (no report available). Initial PSA was 774.   I have personally reviewed the radiological images as listed and agreed with the findings in the report. Abdomen and pelvic CT on 01/05/2006 revealed an enlarged and heterogeneous prostate with mass effect on the base of the bladder.  There was no adenopathy.  Chest, abdomen, and pelvic CT on 03/15/2016 revealed moderate centrilobular emphysema.  There were mild lucent mottled appearance of the osseous structures (cannot exclude multiple myeloma).  There were scattered pulmonary nodules (4 mm or less in size).  There was a 2.7 cm low-attenuation left thyroid nodule.    Abdomen and pelvic CT on 12/07/2016 revealed no new or progressive findings.  There was no evidence of lymphadenopathy in the abdomen or pelvis. There was a stable appearance of heterogeneous/mottled bony mineralization.  Abdomen and pelvic CT on 11/27/2017 was stable with no evidence of adenopathy within the abdomen and pelvis.  There were no suspicious bone lesions.  There was a stable 3 mm RLL nodule.  Work-up on 03/17/2016 revealed no monoclonal protein.  SPEP revealed no monoclonal protein.  Kappa free light chains were 28.9, lambda free light chains 20.7 with a ratio of 1.40 (normal).  Immunoglobulins were normal (IgG 1391, IgA 282, IgM 29).  24 hour urine revealed no monoclonal protein. Thyroid ultrasound on 03/22/2016 revealed a  solitary 2.9 cm left lower pole nodule.  Thyroid biopsy on 04/14/2016 was benign (Bethesda category II) with abundant foamy macrophages and colloid. The cytologic findings were compatible with a cystic colloid nodule  Bone scan on 04/08/2014, 12/29/2014, 02/23/2016, 12/07/2016, and 11/27/2017 revealed no evidence of metastatic disease.  Patient has been on Lupron and Casodex.  He receives Lupron 30 mg every 4 months (last 12/06/2017).    PSA has been followed: 127.6 on 06/30/2014, 168.3 on 12/02/2014, 63.32 on 02/01/2015, 40.31 on 03/03/2015, 29.12 on 04/08/2015, 11.28 on 07/14/2015, 11.02 on 08/17/2015, 10.35 on 11/15/2015, 14.37 on 02/15/2016, 12.46 on 06/06/2016, 13.5 on 06/23/2016, 16.63 on 09/05/2016, 20.77 on 11/07/2016, 16.41 on 12/11/2016, 26.69 on 02/27/2017, 25.67 on 04/02/2017, 19.16 on 05/10/2017, 18.94 on 07/02/2017, 28.28 on 10/01/2017, 30.23 on 12/06/2017, and 34.56 on 03/14/2018.  He has a normocytic anemia. He was diagnosed with B12 deficiency on 12/24/2014. B12 was 169 (low).  He began B12 supplimentation on 02/01/2015 (last 11/15/2017).  Parietal cell antibody was positive on 07/02/2017.  Folate was 11 on 12/24/2014 and 12 on 06/06/2016.  Normal studies included a CMP, ferritin, iron studies, folate, and TSH. TSH and free T4 were normal on 04/02/2017.   # Limited abdominal ultrasound was done on 03/14/2018 revealed a 21.8 x 8.3 x 10.2 cm fatty mass with internal calcifications.  Findings felt to correspond to the CT findings on 11/2017, and not significantly changed from 12/2015 scan.  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  Neoplasm felt to be unlikely.  # chronic bilateral lower extremity ankle edema, ultrasound  showed negative for DVT. #Chronic hypotension on midodrine  INTERVAL HISTORY Charles Flores is a 87 y.o. male who has above history reviewed by me today presents for follow up visit for management of prostate cancer. Patient was  accompanied by her nephew. He reports no new complaints. Chronic shortness of breath with exertion. Appetite is fair. Patient takes Zytiga 750 mg daily as well as prednisone 5 mg twice daily. Tolerates well    Past Medical History:  Diagnosis Date  . Anemia 12/24/2014  . Arthritis   . B12 deficiency anemia 01/01/2015  . Cataract   . Coronary artery disease   . GERD (gastroesophageal reflux disease)   . Hyperlipidemia   . Hyperlipidemia   . Prostate cancer ([name])   . Renal insufficiency 04/15/2017  . Thyroid nodule 03/14/2016    Past Surgical History:  Procedure Laterality Date  . PROSTATE BIOPSY      Family History  Problem Relation Age of Onset  . Diabetes Sister   . Stroke Sister   . Gastric cancer Sister   . Cancer Mother   . Breast cancer Mother   . Kidney disease Father    Social History   Socioeconomic History  . Marital status: Single    Spouse name: Not on file  . Number of children: Not on file  . Years of education: Not on file  . Highest education level: Not on file  Occupational History  . Not on file  Tobacco Use  . Smoking status: Former Smoker    Packs/day: 0.50    Years: 15.00    Pack years: 7.50    Types: Cigarettes  . Smokeless tobacco: Never Used  . Tobacco comment: quit 40 years  Vaping Use  . Vaping Use: Never used  Substance and Sexual Activity  . Alcohol use: No    Alcohol/week: 0.0 standard drinks  . Drug use: No  . Sexual activity: Yes    Partners: Male  Other Topics Concern  . Not on file  Social History Narrative  . Not on file   Social Determinants of Health   Financial Resource Strain: Not on file  Food Insecurity: Not on file  Transportation Needs: Not on file  Physical Activity: Not on file  Stress: Not on file  Social Connections: Not on file  Intimate Partner Violence: Not on file     Allergies: No Known Allergies  Current Outpatient Medications  Medication Sig Dispense Refill  . abiraterone acetate  (ZYTIGA) 250 MG tablet TAKE 3 TABLETS BY MOUTH DAILY. TAKE ON AN EMPTY STOMACH 1 HOUR BEFORE OR 2 HOURS AFTER A MEAL 90 tablet 2  . midodrine (PROAMATINE) 2.5 MG tablet TAKE 1 TABLET (2.5 MG TOTAL) BY MOUTH 3 (THREE) TIMES DAILY WITH MEALS. 270 tablet 2  . omeprazole (PRILOSEC) 20 MG capsule TAKE 1 CAPSULE BY MOUTH EVERY DAY (Patient not taking: No sig reported) 90 capsule 1  . predniSONE (DELTASONE) 5 MG tablet TAKE 1 TABLET BY MOUTH 2 TIMES DAILY WITH A MEAL (Patient not taking: Reported on 10/26/2020) 60 tablet 0   No current facility-administered medications for this visit.   Review of Systems  Constitutional: Positive for fatigue. Negative for appetite change, chills, fever and unexpected weight change.  HENT:   Negative for hearing loss and voice change.   Eyes: Negative for eye problems and icterus.  Respiratory: Positive for shortness of breath. Negative for chest tightness and cough.   Cardiovascular: Positive for leg swelling. Negative for chest pain.  Gastrointestinal:  Negative for abdominal distention and abdominal pain.  Endocrine: Negative for hot flashes.  Genitourinary: Negative for difficulty urinating, dysuria and frequency.   Musculoskeletal: Negative for arthralgias.  Skin: Negative for itching and rash.  Neurological: Negative for light-headedness and numbness.  Hematological: Negative for adenopathy. Does not bruise/bleed easily.  Psychiatric/Behavioral: Negative for confusion.    Performance status (ECOG): 1 - Symptomatic but completely ambulatory  Vital Signs BP 123/76   Pulse 92   Temp (!) 96.3 F (35.7 C)   Resp 16   Wt 151 lb 6.4 oz (68.7 kg)   BMI 21.12 kg/m   Physical Exam Vitals and nursing note reviewed.  Constitutional:      General: He is not in acute distress.    Appearance: He is not diaphoretic.     Comments: Thin, frail appearance  HENT:     Head: Normocephalic and atraumatic.     Nose: Nose normal.     Mouth/Throat:     Dentition: Has  dentures.     Pharynx: No oropharyngeal exudate.  Eyes:     General: No scleral icterus.    Pupils: Pupils are equal, round, and reactive to light.  Neck:     Thyroid: No thyromegaly.     Trachea: No tracheal deviation.  Cardiovascular:     Rate and Rhythm: Normal rate and regular rhythm.     Heart sounds: Normal heart sounds. No murmur heard. No friction rub. No gallop.   Pulmonary:     Effort: Pulmonary effort is normal. No respiratory distress.     Breath sounds: No wheezing or rales.     Comments: Decreased breath sound bilaterally Chest:     Chest wall: No tenderness.  Breasts:     Right: No supraclavicular adenopathy.     Left: No supraclavicular adenopathy.    Abdominal:     General: Bowel sounds are normal. There is no distension.     Palpations: Abdomen is soft. Mass: Large right flank mass: Previously ultrasound consistent with a lipomatous growth fat necrosis and resultant calcification.     Tenderness: There is no abdominal tenderness.  Musculoskeletal:        General: No tenderness. Normal range of motion.     Cervical back: Normal range of motion and neck supple.  Lymphadenopathy:     Cervical: No cervical adenopathy.     Upper Body:     Right upper body: No supraclavicular adenopathy.     Left upper body: No supraclavicular adenopathy.  Skin:    General: Skin is warm and dry.     Findings: No erythema or rash.  Neurological:     Mental Status: He is alert and oriented to person, place, and time.     Cranial Nerves: No cranial nerve deficit.     Motor: No abnormal muscle tone.     Coordination: Coordination normal.  Psychiatric:        Mood and Affect: Mood and affect normal.    CBC    Component Value Date/Time   WBC 4.0 10/26/2020 0925   RBC 3.27 (L) 10/26/2020 0925   HGB 11.3 (L) 10/26/2020 0925   HCT 33.2 (L) 10/26/2020 0925   PLT 175 10/26/2020 0925   MCV 101.5 (H) 10/26/2020 0925   MCH 34.6 (H) 10/26/2020 0925   MCHC 34.0 10/26/2020 0925    RDW 13.0 10/26/2020 0925   LYMPHSABS 0.9 10/26/2020 0925   MONOABS 0.4 10/26/2020 0925   EOSABS 0.0 10/26/2020 0925   BASOSABS 0.0  10/26/2020 0925   CMP Latest Ref Rng & Units 10/26/2020 07/19/2020 04/20/2020  Glucose 70 - 99 mg/dL 96 105(H) 102(H)  BUN 8 - 23 mg/dL 29(H) 27(H) 25(H)  Creatinine 0.61 - 1.24 mg/dL 1.35(H) 1.39(H) 1.30(H)  Sodium 135 - 145 mmol/L 140 141 141  Potassium 3.5 - 5.1 mmol/L 4.0 4.0 3.9  Chloride 98 - 111 mmol/L 106 104 107  CO2 22 - 32 mmol/L _0 Calcium 8.9 - 10.3 mg/dL 9.2 9.5 9.1  Total Protein 6.5 - 8.1 g/dL 6.8 7.0 7.0  Total Bilirubin 0.3 - 1.2 mg/dL 1.1 1.1 1.2  Alkaline Phos 38 - 126 U/L 54 63 67  AST 15 - 41 U/L 14(L) 13(L) 12(L)  ALT 0 - 44 U/L _1 Assessment and Plan 87 y.o. male present for management of prostate cancer and vitamin B12 deficiency. 1. Prostate cancer ([place])   2. Anemia due to vitamin B12 deficiency, unspecified B12 deficiency type   3. 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   Today PSA is pending. Continue androgen deprivation therapy.  Continue Zytiga 750 mg with prednisone 5 mg twice daily.  Refill sent Continue GI prophylaxis with omeprazole.  #Androgen deprivation therapy, proceed with Lupron 22.5 mg today.Marland Kitchen #Pernicious anemia Vitamin B12 deficiency, Proceed with vitamin B12 injection today . #Anemia hemoglobin has been stable.  Hemoglobin is 11.3.  Follow-up in 3 months. Orders Placed This Encounter  Procedures  . PSA    Standing Status:   Future    Number of Occurrences:   1    Standing Expiration Date:   10/26/2021  . CBC with Differential/Platelet    Standing Status:   Future    Standing Expiration Date:   10/26/2021  . Comprehensive metabolic panel    Standing Status:   Future    Standing Expiration Date:   10/26/2021  . Vitamin B12    Standing  Status:   Future    Standing Expiration Date:   10/26/2021  . PSA    Standing Status:   Future    Standing Expiration Date:   10/26/2021    We spent sufficient time to discuss many aspect of care, questions were answered to patient's satisfaction.   Earlie Server, MD, PhD 10/26/2020

## 2020-10-26 NOTE — Progress Notes (Signed)
Patient denies new problems/concerns today.   °

## 2020-10-26 NOTE — Progress Notes (Signed)
[place]  [phone] [phone]   Name: Lake Cinquemani Date: 10/26/2020 MRN: [id number]  DOB: [birth year]  Patient Care Team: Ronnell Freshwater, NP as PCP - General (Family Medicine) Collier Flowers, MD as Referring Physician (Urology)    REASON FOR CONSULTATION: Rawlins Stuard is a 87 y.o. male with multiple medical problems including castration resistant prostate cancer on Lupron, chronic lower extremity edema, B12 deficiency, and anemia.  Patient was referred to palliative care by medical oncology to establish goals and provide ongoing support.  SOCIAL HISTORY:     reports that he has quit smoking. His smoking use included cigarettes. He has a 7.50 pack-year smoking history. He has never used smokeless tobacco. He reports that he does not drink alcohol and does not use drugs.   Patient is unmarried.  He has no children.  He lives at home with his nephew.  He also has a niece who is involved in his care.  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  ADVANCE DIRECTIVES:  Does not have  CODE STATUS: DNR/DNI (MOST Form completed on 09/18/2019)  PAST MEDICAL HISTORY: Past Medical History:  Diagnosis Date  . Anemia 12/24/2014  . Arthritis   . B12 deficiency anemia 01/01/2015  . Cataract   . Coronary artery disease   . GERD (gastroesophageal reflux disease)   . Hyperlipidemia   . Hyperlipidemia   . Prostate cancer ([place])   . Renal insufficiency 04/15/2017  . Thyroid nodule 03/14/2016    PAST SURGICAL HISTORY:  Past Surgical History:  Procedure Laterality Date  . PROSTATE BIOPSY      HEMATOLOGY/ONCOLOGY HISTORY:  Oncology History   No history exists.    ALLERGIES:  has No Known Allergies.  MEDICATIONS:  Current Outpatient Medications  Medication Sig Dispense Refill  . abiraterone acetate (ZYTIGA) 250 MG tablet TAKE 3 TABLETS BY MOUTH DAILY. TAKE ON AN EMPTY STOMACH 1 HOUR BEFORE OR 2 HOURS AFTER A MEAL 90 tablet 2   . midodrine (PROAMATINE) 2.5 MG tablet TAKE 1 TABLET (2.5 MG TOTAL) BY MOUTH 3 (THREE) TIMES DAILY WITH MEALS. 270 tablet 2  . omeprazole (PRILOSEC) 20 MG capsule TAKE 1 CAPSULE BY MOUTH EVERY DAY (Patient not taking: No sig reported) 90 capsule 1  . predniSONE (DELTASONE) 5 MG tablet TAKE 1 TABLET BY MOUTH 2 TIMES DAILY WITH A MEAL (Patient not taking: Reported on 10/26/2020) 60 tablet 0   No current facility-administered medications for this visit.   Facility-Administered Medications Ordered in Other Visits  Medication Dose Route Frequency Provider Last Rate Last Admin  . cyanocobalamin ((VITAMIN B-12)) injection 1,000 mcg  1,000 mcg Intramuscular Once Earlie Server, MD      . Leuprolide Acetate (3 Month) (ELIGARD) 22.5 MG injection 22.5 mg  22.5 mg Subcutaneous Once Earlie Server, MD        VITAL SIGNS: There were no vitals taken for this visit. There were no vitals filed for this visit.  Estimated body mass index is 21.12 kg/m as calculated from the following:   Height as of 11/12/19: 5\' 11"  (1.803 m).   Weight as of an earlier encounter on 10/26/20: 151 lb 6.4 oz (68.7 kg).  LABS: CBC:    Component Value Date/Time   WBC 4.0 10/26/2020 0925   HGB 11.3 (L) 10/26/2020 0925   HCT 33.2 (L) 10/26/2020 0925   PLT 175 10/26/2020 0925   MCV 101.5 (H) 10/26/2020 0925   NEUTROABS 2.6 10/26/2020 0925   LYMPHSABS  0.9 10/26/2020 0925   MONOABS 0.4 10/26/2020 0925   EOSABS 0.0 10/26/2020 0925   BASOSABS 0.0 10/26/2020 0925   Comprehensive Metabolic Panel:    Component Value Date/Time   NA 140 10/26/2020 0925   K 4.0 10/26/2020 0925   CL 106 10/26/2020 0925   CO2 24 10/26/2020 0925   BUN 29 (H) 10/26/2020 0925   CREATININE 1.35 (H) 10/26/2020 0925   GLUCOSE 96 10/26/2020 0925   CALCIUM 9.2 10/26/2020 0925   AST 14 (L) 10/26/2020 0925   ALT 9 10/26/2020 0925   ALKPHOS 54 10/26/2020 0925   BILITOT 1.1 10/26/2020 0925   PROT 6.8 10/26/2020 0925   ALBUMIN 3.9 10/26/2020 0925    RADIOGRAPHIC  STUDIES: No results found.  PERFORMANCE STATUS (ECOG) : 1 - Symptomatic but completely ambulatory  Review of Systems Unless otherwise noted, a complete review of systems is negative.  Physical Exam General: NAD, frail appearing, thin Pulmonary: Unlabored Extremities: no edema, no joint deformities Skin: no rashes Neurological: Weakness but otherwise nonfocal  IMPRESSION: Routine follow-up visit today with patient.  He was accompanied by his nephew.  Patient reports he is doing well.  He denies any significant changes or concerns.  No symptomatic complaints at present.  He denies any issues with medications nor need for refills today.  He reports good oral intake and stable performance status.  I noted that he was in a wheelchair today.  We discussed home equipment but he declined.  He denies any recent falls.  His nephew also did not have any concerns.  PLAN: -Continue current scope of treatment -RTC in 4-6 months    Patient expressed understanding and was in agreement with this plan. He also understands that He can call the clinic at any time with any questions, concerns, or complaints.     Time Total: 15 minutes  Visit consisted of counseling and education dealing with the complex and emotionally intense issues of symptom management and palliative care in the setting of serious and potentially life-threatening illness.Greater than 50%  of this time was spent counseling and coordinating care related to the above assessment and plan.  Signed by: Altha Harm, PhD, NP-C

## 2020-11-05 ENCOUNTER — Emergency Department: Payer: Medicare Other

## 2020-11-05 ENCOUNTER — Other Ambulatory Visit: Payer: Self-pay

## 2020-11-05 ENCOUNTER — Emergency Department
Admission: EM | Admit: 2020-11-05 | Discharge: 2020-11-05 | Disposition: A | Discharge status: Home / Self Care | Payer: Medicare Other | Attending: Emergency Medicine | Admitting: Emergency Medicine

## 2020-11-05 DIAGNOSIS — N1831 Chronic kidney disease, stage 3a: Secondary | ICD-10-CM | POA: Diagnosis not present

## 2020-11-05 DIAGNOSIS — Z8546 Personal history of malignant neoplasm of prostate: Secondary | ICD-10-CM | POA: Insufficient documentation

## 2020-11-05 DIAGNOSIS — Z87891 Personal history of nicotine dependence: Secondary | ICD-10-CM | POA: Diagnosis not present

## 2020-11-05 DIAGNOSIS — H6122 Impacted cerumen, left ear: Secondary | ICD-10-CM | POA: Diagnosis not present

## 2020-11-05 DIAGNOSIS — I2510 Atherosclerotic heart disease of native coronary artery without angina pectoris: Secondary | ICD-10-CM | POA: Diagnosis not present

## 2020-11-05 DIAGNOSIS — H6120 Impacted cerumen, unspecified ear: Secondary | ICD-10-CM

## 2020-11-05 DIAGNOSIS — R42 Dizziness and giddiness: Secondary | ICD-10-CM | POA: Diagnosis not present

## 2020-11-05 LAB — CBC
HCT: 32.2 % — ABNORMAL LOW (ref 39.0–52.0)
Hemoglobin: 11.2 g/dL — ABNORMAL LOW (ref 13.0–17.0)
MCH: 34.9 pg — ABNORMAL HIGH (ref 26.0–34.0)
MCHC: 34.8 g/dL (ref 30.0–36.0)
MCV: 100.3 fL — ABNORMAL HIGH (ref 80.0–100.0)
Platelets: 175 10*3/uL (ref 150–400)
RBC: 3.21 MIL/uL — ABNORMAL LOW (ref 4.22–5.81)
RDW: 12.7 % (ref 11.5–15.5)
WBC: 5.1 10*3/uL (ref 4.0–10.5)
nRBC: 0 % (ref 0.0–0.2)

## 2020-11-05 LAB — BASIC METABOLIC PANEL
Anion gap: 8 (ref 5–15)
BUN: 28 mg/dL — ABNORMAL HIGH (ref 8–23)
CO2: 21 mmol/L — ABNORMAL LOW (ref 22–32)
Calcium: 9.3 mg/dL (ref 8.9–10.3)
Chloride: 107 mmol/L (ref 98–111)
Creatinine, Ser: 1.42 mg/dL — ABNORMAL HIGH (ref 0.61–1.24)
GFR, Estimated: 47 mL/min — ABNORMAL LOW (ref >60)
Glucose, Bld: 97 mg/dL (ref 70–99)
Potassium: 3.7 mmol/L (ref 3.5–5.1)
Sodium: 136 mmol/L (ref 135–145)

## 2020-11-05 MED ORDER — MECLIZINE HCL 12.5 MG PO TABS
12.5000 mg | ORAL_TABLET | Freq: Three times a day (TID) | ORAL | 0 refills | Status: DC | PRN
Start: 2020-11-05 — End: 2021-01-29

## 2020-11-05 MED ORDER — CARBAMIDE PEROXIDE 6.5 % OT SOLN: 5.0000 [drp] | Freq: Two times a day (BID) | OTIC | 0 refills | Status: DC

## 2020-11-05 NOTE — ED Triage Notes (Signed)
Pt states he has felt swimmy headed for a month or longer. Pt denies pain. A&O, in wheelchair. Speech clear, speaking in complete sentences. Able to move extremities.

## 2020-11-05 NOTE — ED Provider Notes (Signed)
[facility] Emergency Department Provider Note  ____________________________________________  Time seen: Approximately 5:50 PM  I have reviewed the triage vital signs and the nursing notes.   HISTORY  Chief Complaint Dizziness    HPI Charles Flores is a 87 y.o. male who presents the emergency department complaining of "dizziness."  Patient states that he has had intermittent symptoms times at least a month.  He states that sometimes it feels like he is spinning, sometimes feels like the room is spinning.  He denies any headache, visual changes, chest pain, shortness of breath, abdominal pain, nausea vomiting, diarrhea or constipation, dysuria, polyuria, hematuria.  Patient is here primarily just for the "dizzy spells."  Patient has a history of anemia, coronary artery disease, GERD, prostate cancer.          Past Medical History:  Diagnosis Date  . Anemia 12/24/2014  . Arthritis   . B12 deficiency anemia 01/01/2015  . Cataract   . Coronary artery disease   . GERD (gastroesophageal reflux disease)   . Hyperlipidemia   . Hyperlipidemia   . Prostate cancer ([place])   . Renal insufficiency 04/15/2017  . Thyroid nodule 03/14/2016    Patient Active Problem List   Diagnosis Date Noted  . B12 deficiency 07/28/2020  . Anemia due to stage 3a chronic kidney disease ([place]) 07/28/2020  . 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 Near syncope 01/18/2018  . SOB (shortness of breath) 01/18/2018  . Pulmonary nodule 12/06/2017  . Encounter for antineoplastic chemotherapy 10/01/2017  . Renal insufficiency 04/15/2017  . Thyroid nodule 03/14/2016  . Weight loss 02/24/2016  . B12 deficiency anemia 01/01/2015  . Prostate cancer ([place]) 12/24/2014  . Anemia 12/24/2014    Past Surgical History:  Procedure Laterality Date  . PROSTATE BIOPSY      Prior to Admission medications   Medication Sig Start Date End Date Taking? Authorizing Provider  carbamide peroxide (DEBROX) 6.5 %  OTIC solution Place 5 drops into the left ear 2 (two) times daily. 11/05/20 11/05/21 Yes Cuthriell, Charline Bills, PA-C  meclizine (ANTIVERT) 12.5 MG tablet Take 1 tablet (12.5 mg total) by mouth 3 (three) times daily as needed for dizziness. 11/05/20  Yes Cuthriell, Charline Bills, PA-C  abiraterone acetate (ZYTIGA) 250 MG tablet TAKE 3 TABLETS BY MOUTH DAILY. TAKE ON AN EMPTY STOMACH 1 HOUR BEFORE OR 2 HOURS AFTER A MEAL 07/19/20   Earlie Server, MD  midodrine (PROAMATINE) 2.5 MG tablet TAKE 1 TABLET (2.5 MG TOTAL) BY MOUTH 3 (THREE) TIMES DAILY WITH MEALS. 03/26/20   Kendell Bane, NP  omeprazole (PRILOSEC) 20 MG capsule TAKE 1 CAPSULE BY MOUTH EVERY DAY Patient not taking: No sig reported 08/30/19   Earlie Server, MD  predniSONE (DELTASONE) 5 MG tablet TAKE 1 TABLET BY MOUTH 2 TIMES DAILY WITH A MEAL Patient not taking: Reported on 10/26/2020 09/22/20   Earlie Server, MD    Allergies Patient has no known allergies.  Family History  Problem Relation Age of Onset  . Diabetes Sister   . Stroke Sister   . Gastric cancer Sister   . Cancer Mother   . Breast cancer Mother   . Kidney disease Father     Social History Social History   Tobacco Use  . Smoking status: Former Smoker    Packs/day: 0.50    Years: 15.00    Pack years: 7.50    Types: Cigarettes  . Smokeless tobacco: Never Used  . Tobacco comment: quit 40 years  Vaping Use  .  Vaping Use: Never used  Substance Use Topics  . Alcohol use: No    Alcohol/week: 0.0 standard drinks  . Drug use: No     Review of Systems  Constitutional: No fever/chills.  Positive for dizziness Eyes: No visual changes. No discharge ENT: No upper respiratory complaints. Cardiovascular: no chest pain. Respiratory: no cough. No SOB. Gastrointestinal: No abdominal pain.  No nausea, no vomiting.  No diarrhea.  No constipation. Genitourinary: Negative for dysuria. No hematuria Musculoskeletal: Negative for musculoskeletal pain. Skin: Negative for rash, abrasions,  lacerations, ecchymosis. Neurological: Negative for headaches, focal weakness or numbness.  10 System ROS otherwise negative.  ____________________________________________   PHYSICAL EXAM:  VITAL SIGNS: ED Triage Vitals  Enc Vitals Group     BP 11/05/20 1630 139/88     Pulse Rate 11/05/20 1630 94     Resp 11/05/20 1630 14     Temp 11/05/20 1630 98.3 F (36.8 C)     Temp Source 11/05/20 1630 Oral     SpO2 11/05/20 1630 100 %     Weight 11/05/20 1631 141 lb (64 kg)     Height 11/05/20 1631 5\' 11"  (1.803 m)     Head Circumference --      Peak Flow --      Pain Score 11/05/20 1631 0     Pain Loc --      Pain Edu? --      Excl. in [place]? --      Constitutional: Alert and oriented. Well appearing and in no acute distress. Eyes: Conjunctivae are normal. PERRL. EOMI. Head: Atraumatic. ENT:      Ears: Left EAC with cerumen identified.  Moderate burden of cerumen but not completely occluded.   TMs unremarkable bilaterally      Nose: No congestion/rhinnorhea.      Mouth/Throat: Mucous membranes are moist.  Neck: No stridor.  Neck is supple full range of motion Hematological/Lymphatic/Immunilogical: No cervical lymphadenopathy. Cardiovascular: Normal rate, regular rhythm. Normal S1 and S2.  Good peripheral circulation. Respiratory: Normal respiratory effort without tachypnea or retractions. Lungs CTAB. Good air entry to the bases with no decreased or absent breath sounds. Gastrointestinal: Bowel sounds 4 quadrants. Soft and nontender to palpation. No guarding or rigidity. No palpable masses. No distention. No CVA tenderness. Musculoskeletal: Full range of motion to all extremities. No gross deformities appreciated. Neurologic:  Normal speech and language. No gross focal neurologic deficits are appreciated.  Cranial nerves II through XII grossly intact.  Negative Romberg's and pronator drift. Skin:  Skin is warm, dry and intact. No rash noted. Psychiatric: Mood and affect are normal.  Speech and behavior are normal. Patient exhibits appropriate insight and judgement.   ____________________________________________   LABS (all labs ordered are listed, but only abnormal results are displayed)  Labs Reviewed  BASIC METABOLIC PANEL - Abnormal; Notable for the following components:      Result Value   CO2 21 (*)    BUN 28 (*)    Creatinine, Ser 1.42 (*)    GFR, Estimated 47 (*)    All other components within normal limits  CBC - Abnormal; Notable for the following components:   RBC 3.21 (*)    Hemoglobin 11.2 (*)    HCT 32.2 (*)    MCV 100.3 (*)    MCH 34.9 (*)    All other components within normal limits  URINALYSIS, COMPLETE (UACMP) WITH MICROSCOPIC  CBG MONITORING, ED   ____________________________________________  EKG  ED ECG REPORT I, Charline Bills  Cuthriell,  personally viewed and interpreted this ECG.   Date: 11/05/2020  EKG Time: 1628 hrs.  Rate: 94 bpm  Rhythm: unchanged from previous tracings, normal sinus rhythm, occasional PVC noted, unifocal  Axis: Leftward axis deviation  Intervals:none  ST&T Change: No ST elevation or depression noted  Normal sinus rhythm.  No STEMI.  Occasional PVCs.  Compared to previous tracing from 01/17/2018, patient now has scattered PVCs but no other significant changes.  ____________________________________________  RADIOLOGY I personally viewed and evaluated these images as part of my medical decision making, as well as reviewing the written report by the radiologist.  ED Provider Interpretation: I concur with radiologist finding of no acute intracranial abnormality.  CT Head Wo Contrast  Result Date: 11/05/2020 CLINICAL DATA:  Dizziness. EXAM: CT HEAD WITHOUT CONTRAST TECHNIQUE: Contiguous axial images were obtained from the base of the skull through the vertex without intravenous contrast. COMPARISON:  None. FINDINGS: Brain: No subdural, epidural, or subarachnoid hemorrhage. Cerebellum, brainstem, and basal  cisterns are normal. Ventricles and sulci are prominent. No mass effect or midline shift. Scattered white matter changes. No acute cortical ischemia or infarct. Vascular: No hyperdense vessel or unexpected calcification. Skull: Normal. Negative for fracture or focal lesion. Sinuses/Orbits: No acute finding. Other: None. IMPRESSION: No acute intracranial abnormalities. Electronically Signed   By: Dorise Bullion III M.D   On: 11/05/2020 18:39    ____________________________________________    PROCEDURES  Procedure(s) performed:    Procedures    Medications - No data to display   ____________________________________________   INITIAL IMPRESSION / ASSESSMENT AND PLAN / ED COURSE  Pertinent labs & imaging results that were available during my care of the patient were reviewed by me and considered in my medical decision making (see chart for details).  Review of the [place] CSRS was performed in accordance of the [place] prior to dispensing any controlled drugs.           Patient's diagnosis is consistent with vertigo.  Patient presented to the emergency department with over a month of intermittent vertigo-like symptoms.  Was not currently experiencing any symptoms but presented for evaluation.  He denies any headache, visual changes, chest pain, shortness of breath, abdominal pain, nausea or vomiting, urinary changes.  Overall physical exam is reassuring.  Patient was neurologically intact.  No other significant findings on physical exam.  EKG is reassuring.  Patient remained in normal sinus rhythm throughout his ED course.  Labs are reassuring.  CT of the head revealed no acute findings.  At this time I feel that symptoms are likely vertiginous symptoms.  Patient did state that the symptoms began roughly the same time that he felt his left ear was "clogged up."  He does have a somewhat large cerumen burn to the left ear.  Patient we have put on Debrox drops, low-dose meclizine.  I discussed  meclizine side effects and patient is aware to be careful while taking that medicine.  He states that he had called ENT to be seen about his ear and he has an appointment in 2 weeks.  Follow-up with this appointment.  Concerning signs and symptoms to include headache, vision changes, weakness, chest pain, shortness of breath are discussed with the patient he will return to the emergency department if he develops any of these symptoms..  Patient is given ED precautions to return to the ED for any worsening or new symptoms.     ____________________________________________  FINAL CLINICAL IMPRESSION(S) / ED DIAGNOSES  Final diagnoses:  Vertigo  Cerumen in auditory canal on examination      NEW MEDICATIONS STARTED DURING THIS VISIT:  ED Discharge Orders         Ordered    meclizine (ANTIVERT) 12.5 MG tablet  3 times daily PRN        11/05/20 2028    carbamide peroxide (DEBROX) 6.5 % OTIC solution  2 times daily        11/05/20 2028              This chart was dictated using voice recognition software/Dragon. Despite best efforts to proofread, errors can occur which can change the meaning. Any change was purely unintentional.    Darletta Moll, PA-C 11/05/20 2029    Duffy Bruce, MD 11/08/20 [phone]

## 2020-11-09 ENCOUNTER — Other Ambulatory Visit: Payer: Self-pay

## 2020-11-09 DIAGNOSIS — I951 Orthostatic hypotension: Secondary | ICD-10-CM

## 2020-11-09 DIAGNOSIS — I950 Idiopathic hypotension: Secondary | ICD-10-CM

## 2020-11-09 MED ORDER — MIDODRINE HCL 2.5 MG PO TABS: 2.5000 mg | ORAL_TABLET | Freq: Three times a day (TID) | ORAL | 0 refills | Status: DC

## 2020-11-12 ENCOUNTER — Ambulatory Visit: Payer: Medicare Other | Admitting: Hospice and Palliative Medicine

## 2020-11-16 ENCOUNTER — Other Ambulatory Visit (HOSPITAL_COMMUNITY): Payer: Self-pay

## 2020-11-17 ENCOUNTER — Other Ambulatory Visit: Payer: Self-pay | Admitting: Oncology

## 2020-11-17 DIAGNOSIS — C61 Malignant neoplasm of prostate: Secondary | ICD-10-CM

## 2020-11-21 ENCOUNTER — Other Ambulatory Visit (HOSPITAL_COMMUNITY): Payer: Self-pay

## 2020-11-21 MED FILL — Abiraterone Acetate Tab 250 MG: ORAL | 30 days supply | Qty: 90 | Fill #0 | Status: AC

## 2020-11-21 MED FILL — Prednisone Tab 5 MG: ORAL | 30 days supply | Qty: 60 | Fill #0 | Status: AC

## 2020-11-22 ENCOUNTER — Other Ambulatory Visit: Payer: Self-pay | Admitting: Otolaryngology

## 2020-11-22 DIAGNOSIS — R42 Dizziness and giddiness: Secondary | ICD-10-CM | POA: Diagnosis not present

## 2020-11-22 DIAGNOSIS — H903 Sensorineural hearing loss, bilateral: Secondary | ICD-10-CM | POA: Diagnosis not present

## 2020-11-22 DIAGNOSIS — H6123 Impacted cerumen, bilateral: Secondary | ICD-10-CM | POA: Diagnosis not present

## 2020-11-23 ENCOUNTER — Other Ambulatory Visit (HOSPITAL_COMMUNITY): Payer: Self-pay

## 2020-11-24 ENCOUNTER — Other Ambulatory Visit (HOSPITAL_COMMUNITY): Payer: Self-pay

## 2020-11-25 ENCOUNTER — Other Ambulatory Visit (HOSPITAL_COMMUNITY): Payer: Self-pay

## 2020-11-27 ENCOUNTER — Other Ambulatory Visit: Payer: Self-pay

## 2020-11-27 ENCOUNTER — Emergency Department
Admission: EM | Admit: 2020-11-27 | Discharge: 2020-11-27 | Disposition: A | Discharge status: Home / Self Care | Payer: Medicare Other | Attending: Emergency Medicine | Admitting: Emergency Medicine

## 2020-11-27 ENCOUNTER — Encounter: Payer: Self-pay | Admitting: Emergency Medicine

## 2020-11-27 ENCOUNTER — Emergency Department: Payer: Medicare Other

## 2020-11-27 DIAGNOSIS — N1831 Chronic kidney disease, stage 3a: Secondary | ICD-10-CM | POA: Diagnosis not present

## 2020-11-27 DIAGNOSIS — Z79899 Other long term (current) drug therapy: Secondary | ICD-10-CM | POA: Insufficient documentation

## 2020-11-27 DIAGNOSIS — I2510 Atherosclerotic heart disease of native coronary artery without angina pectoris: Secondary | ICD-10-CM | POA: Diagnosis not present

## 2020-11-27 DIAGNOSIS — J90 Pleural effusion, not elsewhere classified: Secondary | ICD-10-CM | POA: Diagnosis not present

## 2020-11-27 DIAGNOSIS — Z8546 Personal history of malignant neoplasm of prostate: Secondary | ICD-10-CM | POA: Insufficient documentation

## 2020-11-27 DIAGNOSIS — J439 Emphysema, unspecified: Secondary | ICD-10-CM | POA: Diagnosis not present

## 2020-11-27 DIAGNOSIS — I517 Cardiomegaly: Secondary | ICD-10-CM | POA: Diagnosis not present

## 2020-11-27 DIAGNOSIS — Z9221 Personal history of antineoplastic chemotherapy: Secondary | ICD-10-CM | POA: Insufficient documentation

## 2020-11-27 DIAGNOSIS — R0789 Other chest pain: Secondary | ICD-10-CM | POA: Insufficient documentation

## 2020-11-27 LAB — COMPREHENSIVE METABOLIC PANEL
ALT: 7 U/L (ref 0–44)
AST: 13 U/L — ABNORMAL LOW (ref 15–41)
Albumin: 3.5 g/dL (ref 3.5–5.0)
Alkaline Phosphatase: 46 U/L (ref 38–126)
Anion gap: 8 (ref 5–15)
BUN: 31 mg/dL — ABNORMAL HIGH (ref 8–23)
CO2: 22 mmol/L (ref 22–32)
Calcium: 9.3 mg/dL (ref 8.9–10.3)
Chloride: 108 mmol/L (ref 98–111)
Creatinine, Ser: 1.51 mg/dL — ABNORMAL HIGH (ref 0.61–1.24)
GFR, Estimated: 43 mL/min — ABNORMAL LOW (ref >60)
Glucose, Bld: 87 mg/dL (ref 70–99)
Potassium: 3.5 mmol/L (ref 3.5–5.1)
Sodium: 138 mmol/L (ref 135–145)
Total Bilirubin: 1.2 mg/dL (ref 0.3–1.2)
Total Protein: 6 g/dL — ABNORMAL LOW (ref 6.5–8.1)

## 2020-11-27 LAB — TROPONIN I (HIGH SENSITIVITY): Troponin I (High Sensitivity): 11 ng/L (ref <18)

## 2020-11-27 LAB — CBC
HCT: 32.4 % — ABNORMAL LOW (ref 39.0–52.0)
Hemoglobin: 11.2 g/dL — ABNORMAL LOW (ref 13.0–17.0)
MCH: 34.3 pg — ABNORMAL HIGH (ref 26.0–34.0)
MCHC: 34.6 g/dL (ref 30.0–36.0)
MCV: 99.1 fL (ref 80.0–100.0)
Platelets: 177 10*3/uL (ref 150–400)
RBC: 3.27 MIL/uL — ABNORMAL LOW (ref 4.22–5.81)
RDW: 13.1 % (ref 11.5–15.5)
WBC: 6.1 10*3/uL (ref 4.0–10.5)
nRBC: 0 % (ref 0.0–0.2)

## 2020-11-27 NOTE — ED Provider Notes (Signed)
[facility] Emergency Department Provider Note   ____________________________________________    I have reviewed the triage vital signs and the nursing notes.   HISTORY  Chief Complaint Chest Pain     HPI Charles Flores is a 87 y.o. male who reports he has been having nighttime chest pain for approximately 3 months, it seemed to get worse last night, describes it as a cramping sensation.  No pain this morning, feeling better.  No shortness of breath fevers or chills.  No nausea vomiting or diaphoresis.  Has not taken anything for this.  Currently feels well and has no complaints.  No calf pain but has noticed swelling in his lower legs over the last month or 2  Past Medical History:  Diagnosis Date  . Anemia 12/24/2014  . Arthritis   . B12 deficiency anemia 01/01/2015  . Cataract   . Coronary artery disease   . GERD (gastroesophageal reflux disease)   . Hyperlipidemia   . Hyperlipidemia   . Prostate cancer ([place])   . Renal insufficiency 04/15/2017  . Thyroid nodule 03/14/2016    Patient Active Problem List   Diagnosis Date Noted  . B12 deficiency 07/28/2020  . Anemia due to stage 3a chronic kidney disease ([place]) 07/28/2020  . 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 Near syncope 01/18/2018  . SOB (shortness of breath) 01/18/2018  . Pulmonary nodule 12/06/2017  . Encounter for antineoplastic chemotherapy 10/01/2017  . Renal insufficiency 04/15/2017  . Thyroid nodule 03/14/2016  . Weight loss 02/24/2016  . B12 deficiency anemia 01/01/2015  . Prostate cancer ([place]) 12/24/2014  . Anemia 12/24/2014    Past Surgical History:  Procedure Laterality Date  . PROSTATE BIOPSY      Prior to Admission medications   Medication Sig Start Date End Date Taking? Authorizing Provider  abiraterone acetate (ZYTIGA) 250 MG tablet TAKE 3 TABLETS BY MOUTH DAILY. TAKE ON AN EMPTY STOMACH 1 HOUR BEFORE OR 2 HOURS AFTER A MEAL 11/17/20   Earlie Server, MD  carbamide peroxide  (DEBROX) 6.5 % OTIC solution Place 5 drops into the left ear 2 (two) times daily. 11/05/20 11/05/21  Cuthriell, Charline Bills, PA-C  meclizine (ANTIVERT) 12.5 MG tablet Take 1 tablet (12.5 mg total) by mouth 3 (three) times daily as needed for dizziness. 11/05/20   Cuthriell, Charline Bills, PA-C  midodrine (PROAMATINE) 2.5 MG tablet Take 1 tablet (2.5 mg total) by mouth 3 (three) times daily with meals. 11/09/20   Lavera Guise, MD  omeprazole ([place]) 20 MG capsule TAKE 1 CAPSULE BY MOUTH EVERY DAY Patient not taking: No sig reported 08/30/19   Earlie Server, MD  predniSONE (DELTASONE) 5 MG tablet TAKE 1 TABLET BY MOUTH 2 TIMES DAILY WITH A MEAL Patient not taking: Reported on 10/26/2020 09/22/20 09/22/21  Earlie Server, MD     Allergies Patient has no known allergies.  Family History  Problem Relation Age of Onset  . Diabetes Sister   . Stroke Sister   . Gastric cancer Sister   . Cancer Mother   . Breast cancer Mother   . Kidney disease Father     Social History Social History   Tobacco Use  . Smoking status: Former Smoker    Packs/day: 0.50    Years: 15.00    Pack years: 7.50    Types: Cigarettes  . Smokeless tobacco: Never Used  . Tobacco comment: quit 40 years  Vaping Use  . Vaping Use: Never used  Substance Use Topics  .  Alcohol use: No    Alcohol/week: 0.0 standard drinks  . Drug use: No    Review of Systems  Constitutional: No fever/chills Eyes: No visual changes.  ENT: No sore throat. Cardiovascular: As above Respiratory: Denies shortness of breath. Gastrointestinal: No abdominal pain.  No nausea, no vomiting.   Genitourinary: Negative for dysuria. Musculoskeletal: As above Skin: Negative for rash. Neurological: Negative for headaches or weakness   ____________________________________________   PHYSICAL EXAM:  VITAL SIGNS: ED Triage Vitals  Enc Vitals Group     BP 11/27/20 0851 (!) 157/92     Pulse Rate 11/27/20 0851 85     Resp 11/27/20 0851 15     Temp 11/27/20 0851  98.4 F (36.9 C)     Temp src --      SpO2 11/27/20 0851 98 %     Weight 11/27/20 0849 64 kg (141 lb)     Height 11/27/20 0849 1.803 m (5\' 11" )     Head Circumference --      Peak Flow --      Pain Score 11/27/20 0849 0     Pain Loc --      Pain Edu? --      Excl. in [place]? --     Constitutional: Alert and oriented. No acute distress. Pleasant and interactive  Nose: No congestion/rhinnorhea. Mouth/Throat: Mucous membranes are moist.   Neck:  Painless ROM Cardiovascular: Normal rate, regular rhythm. Grossly normal heart sounds.  Good peripheral circulation. Respiratory: Normal respiratory effort.  No retractions. Lungs CTAB. Gastrointestinal: Soft and nontender. No distention.  No CVA tenderness.  Musculoskeletal: 1+ edema bilaterally, warm and well perfused Neurologic:  Normal speech and language. No gross focal neurologic deficits are appreciated.  Skin:  Skin is warm, dry and intact. No rash noted. Psychiatric: Mood and affect are normal. Speech and behavior are normal.  ____________________________________________   LABS (all labs ordered are listed, but only abnormal results are displayed)  Labs Reviewed  CBC - Abnormal; Notable for the following components:      Result Value   RBC 3.27 (*)    Hemoglobin 11.2 (*)    HCT 32.4 (*)    MCH 34.3 (*)    All other components within normal limits  COMPREHENSIVE METABOLIC PANEL - Abnormal; Notable for the following components:   BUN 31 (*)    Creatinine, Ser 1.51 (*)    Total Protein 6.0 (*)    AST 13 (*)    GFR, Estimated 43 (*)    All other components within normal limits  TROPONIN I (HIGH SENSITIVITY)   ____________________________________________  EKG  ED ECG REPORT I, Lavonia Drafts, the attending physician, personally viewed and interpreted this ECG.  Date: 11/27/2020  Rhythm: normal sinus rhythm QRS Axis: normal Intervals: normal ST/T Wave abnormalities: normal Narrative Interpretation: no evidence of  acute ischemia  ____________________________________________  RADIOLOGY  Chest x-ray reviewed by me, small left pleural ____________________________________________   PROCEDURES  Procedure(s) performed: No  Procedures   Critical Care performed: No ____________________________________________   INITIAL IMPRESSION / ASSESSMENT AND PLAN / ED COURSE  Pertinent labs & imaging results that were available during my care of the patient were reviewed by me and considered in my medical decision making (see chart for details).  Patient overall well-appearing and in no acute distress today, denies active chest pain at this time.  Seems to have an acute on chronic episode of chest discomfort last night.  Question aspect of GERD/esophagitis given it seems to  happen at night only.  EKG is overall reassuring.  High sensitive troponin is reassuring, lab work otherwise unremarkable.  Given the patient is asymptomatic with reassuring work-up appropriate for discharge at this time with close outpatient follow-up with cardiology, strict return precautions discussed.    ____________________________________________   FINAL CLINICAL IMPRESSION(S) / ED DIAGNOSES  Final diagnoses:  Atypical chest pain        Note:  This document was prepared using Dragon voice recognition software and may include unintentional dictation errors.   Lavonia Drafts, MD 11/27/20 1135

## 2020-11-27 NOTE — ED Triage Notes (Signed)
Pt via POV from home. Pt c/o CP for about 3 months and it got worse last night. Pt states it is centralized and radiates to the R arm. Denies NVD. Pt also states he had some SOB associated with it. Pt is A&OX4 and NAD.

## 2020-12-01 DIAGNOSIS — R0602 Shortness of breath: Secondary | ICD-10-CM | POA: Diagnosis not present

## 2020-12-01 DIAGNOSIS — R011 Cardiac murmur, unspecified: Secondary | ICD-10-CM | POA: Diagnosis not present

## 2020-12-01 DIAGNOSIS — N182 Chronic kidney disease, stage 2 (mild): Secondary | ICD-10-CM | POA: Diagnosis not present

## 2020-12-01 DIAGNOSIS — I208 Other forms of angina pectoris: Secondary | ICD-10-CM | POA: Diagnosis not present

## 2020-12-01 DIAGNOSIS — I959 Hypotension, unspecified: Secondary | ICD-10-CM | POA: Diagnosis not present

## 2020-12-01 DIAGNOSIS — E782 Mixed hyperlipidemia: Secondary | ICD-10-CM | POA: Diagnosis not present

## 2020-12-01 DIAGNOSIS — K219 Gastro-esophageal reflux disease without esophagitis: Secondary | ICD-10-CM | POA: Diagnosis not present

## 2020-12-01 DIAGNOSIS — Z87891 Personal history of nicotine dependence: Secondary | ICD-10-CM | POA: Diagnosis not present

## 2020-12-02 ENCOUNTER — Ambulatory Visit: Admission: RE | Admit: 2020-12-02 | Payer: Medicare Other | Source: Ambulatory Visit

## 2020-12-07 ENCOUNTER — Encounter: Payer: Self-pay | Admitting: Hospice and Palliative Medicine

## 2020-12-07 ENCOUNTER — Ambulatory Visit (INDEPENDENT_AMBULATORY_CARE_PROVIDER_SITE_OTHER): Payer: Medicare Other | Admitting: Hospice and Palliative Medicine

## 2020-12-07 ENCOUNTER — Other Ambulatory Visit: Payer: Self-pay

## 2020-12-07 VITALS — BP 124/82 | HR 82 | Temp 98.3°F | Ht 71.0 in | Wt 141.4 lb

## 2020-12-07 DIAGNOSIS — C61 Malignant neoplasm of prostate: Secondary | ICD-10-CM

## 2020-12-07 DIAGNOSIS — I950 Idiopathic hypotension: Secondary | ICD-10-CM | POA: Diagnosis not present

## 2020-12-07 DIAGNOSIS — Z0001 Encounter for general adult medical examination with abnormal findings: Secondary | ICD-10-CM | POA: Diagnosis not present

## 2020-12-07 NOTE — Progress Notes (Signed)
[facility] [place], [place] [address]  Internal MEDICINE  Office Visit Note  Patient Name: Charles Flores  [id number]  [id number]  Date of Service: 12/14/2020  Chief Complaint  Patient presents with  . Medicare Wellness     HPI Pt is here for routine health maintenance examination Accompanied by his nephew today whom serves as his primary caregiver Overall, things have been going well History of prostate cancer, normocytic anemia as well as chronic hypotension Followed and managed by oncology, remains stable and tolerating Zytiga as well as prednisone well  He reports he is feeling well, continues to have a good appetite, no issues with constipation Sleeping well at night and feels rested throughout the day Denies any acute or chronic pain  Has been evaluated by cardiology for chest pain at rest--recommended echocardiogram which has not yet been performed, recommended avoiding further invasive testing He declines further episodes of chest pain and does not wish to proceed with further testing at this time  Reviewed labs from other providers--remains stable  Current Medication: Outpatient Encounter Medications as of 12/07/2020  Medication Sig  . abiraterone acetate (ZYTIGA) 250 MG tablet TAKE 3 TABLETS BY MOUTH DAILY. TAKE ON AN EMPTY STOMACH 1 HOUR BEFORE OR 2 HOURS AFTER A MEAL  . carbamide peroxide (DEBROX) 6.5 % OTIC solution Place 5 drops into the left ear 2 (two) times daily.  . meclizine (ANTIVERT) 12.5 MG tablet Take 1 tablet (12.5 mg total) by mouth 3 (three) times daily as needed for dizziness.  . midodrine (PROAMATINE) 2.5 MG tablet Take 1 tablet (2.5 mg total) by mouth 3 (three) times daily with meals.  Marland Kitchen omeprazole (PRILOSEC) 20 MG capsule TAKE 1 CAPSULE BY MOUTH EVERY DAY  . predniSONE (DELTASONE) 5 MG tablet TAKE 1 TABLET BY MOUTH 2 TIMES DAILY WITH A MEAL   No facility-administered encounter medications on file as of 12/07/2020.     Surgical History: Past Surgical History:  Procedure Laterality Date  . PROSTATE BIOPSY      Medical History: Past Medical History:  Diagnosis Date  . Anemia 12/24/2014  . Arthritis   . B12 deficiency anemia 01/01/2015  . Cataract   . Coronary artery disease   . GERD (gastroesophageal reflux disease)   . Hyperlipidemia   . Hyperlipidemia   . Prostate cancer ([place])   . Renal insufficiency 04/15/2017  . Thyroid nodule 03/14/2016    Family History: Family History  Problem Relation Age of Onset  . Diabetes Sister   . Stroke Sister   . Gastric cancer Sister   . Cancer Mother   . Breast cancer Mother   . Kidney disease Father       Review of Systems  Constitutional: Negative for chills, fatigue and unexpected weight change.  HENT: Negative for congestion, postnasal drip, rhinorrhea, sneezing and sore throat.   Eyes: Negative for redness.  Respiratory: Negative for cough, chest tightness and shortness of breath.   Cardiovascular: Negative for chest pain and palpitations.  Gastrointestinal: Negative for abdominal pain, constipation, diarrhea, nausea and vomiting.  Genitourinary: Negative for dysuria and frequency.  Musculoskeletal: Negative for arthralgias, back pain, joint swelling and neck pain.  Skin: Negative for rash.  Neurological: Negative for tremors and numbness.  Hematological: Negative for adenopathy. Does not bruise/bleed easily.  Psychiatric/Behavioral: Negative for behavioral problems (Depression), sleep disturbance and suicidal ideas. The patient is not nervous/anxious.      Vital Signs: BP 124/82   Pulse 82   Temp 98.3 F (  36.8 C)   Ht 5\' 11"  (1.803 m)   Wt 141 lb 6.4 oz (64.1 kg)   SpO2 93%   BMI 19.72 kg/m    Physical Exam Vitals reviewed.  Constitutional:      Appearance: Normal appearance. He is normal weight.  Cardiovascular:     Rate and Rhythm: Normal rate and regular rhythm.     Pulses: Normal pulses.     Heart sounds: Normal heart  sounds.  Pulmonary:     Effort: Pulmonary effort is normal.     Breath sounds: Normal breath sounds.  Abdominal:     General: Abdomen is flat.     Palpations: Abdomen is soft.  Musculoskeletal:        General: Normal range of motion.     Cervical back: Normal range of motion.  Skin:    General: Skin is warm.  Neurological:     General: No focal deficit present.     Mental Status: He is alert and oriented to person, place, and time. Mental status is at baseline.  Psychiatric:        Mood and Affect: Mood normal.        Behavior: Behavior normal.        Thought Content: Thought content normal.        Judgment: Judgment normal.      LABS: Recent Results (from the past 2160 hour(s))  Comprehensive metabolic panel     Status: Abnormal   Collection Time: 10/26/20  9:25 AM  Result Value Ref Range   Sodium 140 135 - 145 mmol/L   Potassium 4.0 3.5 - 5.1 mmol/L   Chloride 106 98 - 111 mmol/L   CO2 24 22 - 32 mmol/L   Glucose, Bld 96 70 - 99 mg/dL    Comment: Glucose reference range applies only to samples taken after fasting for at least 8 hours.   BUN 29 (H) 8 - 23 mg/dL   Creatinine, Ser 1.35 (H) 0.61 - 1.24 mg/dL   Calcium 9.2 8.9 - 10.3 mg/dL   Total Protein 6.8 6.5 - 8.1 g/dL   Albumin 3.9 3.5 - 5.0 g/dL   AST 14 (L) 15 - 41 U/L   ALT 9 0 - 44 U/L   Alkaline Phosphatase 54 38 - 126 U/L   Total Bilirubin 1.1 0.3 - 1.2 mg/dL   GFR, Estimated 50 (L) >60 mL/min    Comment: (NOTE) Calculated using the CKD-EPI Creatinine Equation (2021)    Anion gap 10 5 - 15    Comment: Performed at [facility], [address] [place], [place] [address]  Vitamin B12     Status: None   Collection Time: 10/26/20  9:25 AM  Result Value Ref Range   Vitamin B-12 298 180 - 914 pg/mL    Comment: (NOTE) This assay is not validated for testing neonatal or myeloproliferative syndrome specimens for Vitamin B12 levels. Performed at [facility], [place] [address] [place],  [place] [address]   Folate     Status: None   Collection Time: 10/26/20  9:25 AM  Result Value Ref Range   Folate 13.4 >5.9 ng/mL    Comment: Performed at [facility], [address] [place], [place] [address]  CBC with Differential/Platelet     Status: Abnormal   Collection Time: 10/26/20  9:25 AM  Result Value Ref Range   WBC 4.0 4.0 - 10.5 K/uL   RBC 3.27 (L) 4.22 - 5.81 MIL/uL  Hemoglobin 11.3 (L) 13.0 - 17.0 g/dL   HCT 33.2 (L) 39.0 - 52.0 %   MCV 101.5 (H) 80.0 - 100.0 fL   MCH 34.6 (H) 26.0 - 34.0 pg   MCHC 34.0 30.0 - 36.0 g/dL   RDW 13.0 11.5 - 15.5 %   Platelets 175 150 - 400 K/uL   nRBC 0.0 0.0 - 0.2 %   Neutrophils Relative % 65 %   Neutro Abs 2.6 1.7 - 7.7 K/uL   Lymphocytes Relative 22 %   Lymphs Abs 0.9 0.7 - 4.0 K/uL   Monocytes Relative 11 %   Monocytes Absolute 0.4 0.1 - 1.0 K/uL   Eosinophils Relative 1 %   Eosinophils Absolute 0.0 0.0 - 0.5 K/uL   Basophils Relative 1 %   Basophils Absolute 0.0 0.0 - 0.1 K/uL   Immature Granulocytes 0 %   Abs Immature Granulocytes 0.01 0.00 - 0.07 K/uL    Comment: Performed at [facility], [place], [place] [address]  PSA     Status: None   Collection Time: 10/26/20  9:25 AM  Result Value Ref Range   Prostatic Specific Antigen 1.10 0.00 - 4.00 ng/mL    Comment: (NOTE) While PSA levels of <=4.0 ng/ml are reported as reference range, some men with levels below 4.0 ng/ml can have prostate cancer and many men with PSA above 4.0 ng/ml do not have prostate cancer.  Other tests such as free PSA, age specific reference ranges, PSA velocity and PSA doubling time may be helpful especially in men less than 50 years old. Performed at [facility], [place] [address], [place], [place] [address]   Basic metabolic panel     Status: Abnormal   Collection Time: 11/05/20  4:33 PM  Result Value Ref Range   Sodium 136 135 - 145 mmol/L   Potassium 3.7 3.5 - 5.1 mmol/L   Chloride 107 98 - 111 mmol/L    CO2 21 (L) 22 - 32 mmol/L   Glucose, Bld 97 70 - 99 mg/dL    Comment: Glucose reference range applies only to samples taken after fasting for at least 8 hours.   BUN 28 (H) 8 - 23 mg/dL   Creatinine, Ser 1.42 (H) 0.61 - 1.24 mg/dL   Calcium 9.3 8.9 - 10.3 mg/dL   GFR, Estimated 47 (L) >60 mL/min    Comment: (NOTE) Calculated using the CKD-EPI Creatinine Equation (2021)    Anion gap 8 5 - 15    Comment: Performed at [facility], [address] [place], [place] [address]  CBC     Status: Abnormal   Collection Time: 11/05/20  4:33 PM  Result Value Ref Range   WBC 5.1 4.0 - 10.5 K/uL   RBC 3.21 (L) 4.22 - 5.81 MIL/uL   Hemoglobin 11.2 (L) 13.0 - 17.0 g/dL   HCT 32.2 (L) 39.0 - 52.0 %   MCV 100.3 (H) 80.0 - 100.0 fL   MCH 34.9 (H) 26.0 - 34.0 pg   MCHC 34.8 30.0 - 36.0 g/dL   RDW 12.7 11.5 - 15.5 %   Platelets 175 150 - 400 K/uL   nRBC 0.0 0.0 - 0.2 %    Comment: Performed at [facility], [address] [place], [place] [address]  CBC     Status: Abnormal   Collection Time: 11/27/20  9:30 AM  Result Value Ref Range   WBC 6.1 4.0 - 10.5 K/uL   RBC 3.27 (L)  4.22 - 5.81 MIL/uL   Hemoglobin 11.2 (L) 13.0 - 17.0 g/dL   HCT 32.4 (L) 39.0 - 52.0 %   MCV 99.1 80.0 - 100.0 fL   MCH 34.3 (H) 26.0 - 34.0 pg   MCHC 34.6 30.0 - 36.0 g/dL   RDW 13.1 11.5 - 15.5 %   Platelets 177 150 - 400 K/uL   nRBC 0.0 0.0 - 0.2 %    Comment: Performed at [facility], [address] [place], [place] [address]  Comprehensive metabolic panel     Status: Abnormal   Collection Time: 11/27/20  9:30 AM  Result Value Ref Range   Sodium 138 135 - 145 mmol/L   Potassium 3.5 3.5 - 5.1 mmol/L   Chloride 108 98 - 111 mmol/L   CO2 22 22 - 32 mmol/L   Glucose, Bld 87 70 - 99 mg/dL    Comment: Glucose reference range applies only to samples taken after fasting for at least 8 hours.   BUN 31 (H) 8 - 23 mg/dL   Creatinine, Ser 1.51 (H) 0.61 - 1.24 mg/dL   Calcium 9.3 8.9 - 10.3  mg/dL   Total Protein 6.0 (L) 6.5 - 8.1 g/dL   Albumin 3.5 3.5 - 5.0 g/dL   AST 13 (L) 15 - 41 U/L   ALT 7 0 - 44 U/L   Alkaline Phosphatase 46 38 - 126 U/L   Total Bilirubin 1.2 0.3 - 1.2 mg/dL   GFR, Estimated 43 (L) >60 mL/min    Comment: (NOTE) Calculated using the CKD-EPI Creatinine Equation (2021)    Anion gap 8 5 - 15    Comment: Performed at [facility], [place], [place] [address]  Troponin I (High Sensitivity)     Status: None   Collection Time: 11/27/20  9:30 AM  Result Value Ref Range   Troponin I (High Sensitivity) 11 <18 ng/L    Comment: (NOTE) Elevated high sensitivity troponin I (hsTnI) values and significant  changes across serial measurements may suggest ACS but many other  chronic and acute conditions are known to elevate hsTnI results.  Refer to the "Links" section for chest pain algorithms and additional  guidance. Performed at [facility], [address], [place],  [address]     Assessment/Plan: 1. Encounter for routine adult health examination with abnormal findings Frail appearing 87 year old male Up to date on age appropriate PHM  2. Prostate cancer ([place]) Tolerating treatment regimen well, followed and managed by oncology  3. Idiopathic hypotension Continues to be well controlled with midodrine, continue to closely monitor  General Counseling: mickle campton understanding of the findings of todays visit and agrees with plan of treatment. I have discussed any further diagnostic evaluation that may be needed or ordered today. We also reviewed his medications today. he has been encouraged to call the office with any questions or concerns that should arise related to todays visit.    Counseling:  Total time spent: 30 Minutes  Time spent includes review of chart, medications, test results, and follow up plan with the patient.   This patient was seen by Theodoro Grist AGNP-C Collaboration with Dr Lavera Guise as a part of collaborative care agreement   Tanna Furry. [facility] Internal Medicine

## 2020-12-14 ENCOUNTER — Encounter: Payer: Self-pay | Admitting: Hospice and Palliative Medicine

## 2020-12-21 ENCOUNTER — Other Ambulatory Visit: Payer: Self-pay | Admitting: Oncology

## 2020-12-21 ENCOUNTER — Other Ambulatory Visit (HOSPITAL_COMMUNITY): Payer: Self-pay

## 2020-12-21 DIAGNOSIS — C61 Malignant neoplasm of prostate: Secondary | ICD-10-CM

## 2020-12-21 MED ORDER — PREDNISONE 5 MG PO TABS
ORAL_TABLET | Freq: Two times a day (BID) | ORAL | 0 refills | Status: DC
  Filled 2020-12-21: qty 60, 30d supply, fill #0

## 2020-12-21 MED FILL — Abiraterone Acetate Tab 250 MG: ORAL | 30 days supply | Qty: 90 | Fill #1 | Status: AC

## 2020-12-22 ENCOUNTER — Other Ambulatory Visit (HOSPITAL_COMMUNITY): Payer: Self-pay

## 2020-12-27 ENCOUNTER — Other Ambulatory Visit (HOSPITAL_COMMUNITY): Payer: Self-pay

## 2020-12-27 DIAGNOSIS — Z1159 Encounter for screening for other viral diseases: Secondary | ICD-10-CM | POA: Diagnosis not present

## 2020-12-27 DIAGNOSIS — I471 Supraventricular tachycardia: Secondary | ICD-10-CM | POA: Diagnosis not present

## 2020-12-27 DIAGNOSIS — R8290 Unspecified abnormal findings in urine: Secondary | ICD-10-CM | POA: Diagnosis not present

## 2020-12-27 DIAGNOSIS — E785 Hyperlipidemia, unspecified: Secondary | ICD-10-CM | POA: Diagnosis not present

## 2020-12-27 DIAGNOSIS — D631 Anemia in chronic kidney disease: Secondary | ICD-10-CM | POA: Diagnosis not present

## 2020-12-27 DIAGNOSIS — D539 Nutritional anemia, unspecified: Secondary | ICD-10-CM | POA: Diagnosis not present

## 2020-12-27 DIAGNOSIS — E7800 Pure hypercholesterolemia, unspecified: Secondary | ICD-10-CM | POA: Diagnosis not present

## 2020-12-27 DIAGNOSIS — E538 Deficiency of other specified B group vitamins: Secondary | ICD-10-CM | POA: Diagnosis not present

## 2020-12-27 DIAGNOSIS — I2510 Atherosclerotic heart disease of native coronary artery without angina pectoris: Secondary | ICD-10-CM | POA: Diagnosis not present

## 2020-12-27 DIAGNOSIS — E041 Nontoxic single thyroid nodule: Secondary | ICD-10-CM | POA: Diagnosis not present

## 2020-12-27 DIAGNOSIS — R0602 Shortness of breath: Secondary | ICD-10-CM | POA: Diagnosis not present

## 2020-12-27 DIAGNOSIS — R55 Syncope and collapse: Secondary | ICD-10-CM | POA: Diagnosis not present

## 2020-12-27 DIAGNOSIS — Z79899 Other long term (current) drug therapy: Secondary | ICD-10-CM | POA: Diagnosis not present

## 2020-12-27 DIAGNOSIS — R600 Localized edema: Secondary | ICD-10-CM | POA: Diagnosis not present

## 2020-12-27 DIAGNOSIS — R5381 Other malaise: Secondary | ICD-10-CM | POA: Diagnosis not present

## 2020-12-27 DIAGNOSIS — K820 Obstruction of gallbladder: Secondary | ICD-10-CM | POA: Diagnosis not present

## 2020-12-27 DIAGNOSIS — I071 Rheumatic tricuspid insufficiency: Secondary | ICD-10-CM | POA: Diagnosis not present

## 2020-12-27 DIAGNOSIS — I950 Idiopathic hypotension: Secondary | ICD-10-CM | POA: Diagnosis not present

## 2020-12-27 DIAGNOSIS — K219 Gastro-esophageal reflux disease without esophagitis: Secondary | ICD-10-CM | POA: Diagnosis not present

## 2020-12-27 DIAGNOSIS — Z681 Body mass index (BMI) 19 or less, adult: Secondary | ICD-10-CM | POA: Diagnosis not present

## 2020-12-27 DIAGNOSIS — M6281 Muscle weakness (generalized): Secondary | ICD-10-CM | POA: Diagnosis not present

## 2020-12-27 DIAGNOSIS — R531 Weakness: Secondary | ICD-10-CM | POA: Diagnosis not present

## 2020-12-27 DIAGNOSIS — Z7982 Long term (current) use of aspirin: Secondary | ICD-10-CM | POA: Diagnosis not present

## 2020-12-27 DIAGNOSIS — R279 Unspecified lack of coordination: Secondary | ICD-10-CM | POA: Diagnosis not present

## 2020-12-27 DIAGNOSIS — N289 Disorder of kidney and ureter, unspecified: Secondary | ICD-10-CM | POA: Diagnosis not present

## 2020-12-27 DIAGNOSIS — E46 Unspecified protein-calorie malnutrition: Secondary | ICD-10-CM | POA: Diagnosis not present

## 2020-12-27 DIAGNOSIS — R296 Repeated falls: Secondary | ICD-10-CM | POA: Diagnosis not present

## 2020-12-27 DIAGNOSIS — I959 Hypotension, unspecified: Secondary | ICD-10-CM | POA: Diagnosis not present

## 2020-12-27 DIAGNOSIS — R911 Solitary pulmonary nodule: Secondary | ICD-10-CM | POA: Diagnosis not present

## 2020-12-27 DIAGNOSIS — R609 Edema, unspecified: Secondary | ICD-10-CM | POA: Diagnosis not present

## 2020-12-27 DIAGNOSIS — G9389 Other specified disorders of brain: Secondary | ICD-10-CM | POA: Diagnosis not present

## 2020-12-27 DIAGNOSIS — R627 Adult failure to thrive: Secondary | ICD-10-CM | POA: Diagnosis not present

## 2020-12-27 DIAGNOSIS — I6529 Occlusion and stenosis of unspecified carotid artery: Secondary | ICD-10-CM | POA: Diagnosis not present

## 2020-12-27 DIAGNOSIS — N1831 Chronic kidney disease, stage 3a: Secondary | ICD-10-CM | POA: Diagnosis not present

## 2020-12-27 DIAGNOSIS — E860 Dehydration: Secondary | ICD-10-CM | POA: Diagnosis not present

## 2020-12-27 DIAGNOSIS — Z66 Do not resuscitate: Secondary | ICD-10-CM | POA: Diagnosis not present

## 2020-12-27 DIAGNOSIS — E539 Vitamin B deficiency, unspecified: Secondary | ICD-10-CM | POA: Diagnosis not present

## 2020-12-27 DIAGNOSIS — Z20822 Contact with and (suspected) exposure to covid-19: Secondary | ICD-10-CM | POA: Diagnosis not present

## 2020-12-27 DIAGNOSIS — K5900 Constipation, unspecified: Secondary | ICD-10-CM | POA: Diagnosis not present

## 2020-12-27 DIAGNOSIS — I951 Orthostatic hypotension: Secondary | ICD-10-CM | POA: Diagnosis not present

## 2020-12-27 DIAGNOSIS — E861 Hypovolemia: Secondary | ICD-10-CM | POA: Diagnosis not present

## 2020-12-27 DIAGNOSIS — G319 Degenerative disease of nervous system, unspecified: Secondary | ICD-10-CM | POA: Diagnosis not present

## 2020-12-27 DIAGNOSIS — I499 Cardiac arrhythmia, unspecified: Secondary | ICD-10-CM | POA: Diagnosis not present

## 2020-12-27 DIAGNOSIS — Z87891 Personal history of nicotine dependence: Secondary | ICD-10-CM | POA: Diagnosis not present

## 2020-12-27 DIAGNOSIS — E43 Unspecified severe protein-calorie malnutrition: Secondary | ICD-10-CM | POA: Diagnosis not present

## 2020-12-28 DIAGNOSIS — I950 Idiopathic hypotension: Secondary | ICD-10-CM | POA: Diagnosis not present

## 2020-12-28 DIAGNOSIS — K820 Obstruction of gallbladder: Secondary | ICD-10-CM | POA: Diagnosis not present

## 2020-12-28 DIAGNOSIS — R609 Edema, unspecified: Secondary | ICD-10-CM | POA: Diagnosis not present

## 2020-12-28 DIAGNOSIS — I071 Rheumatic tricuspid insufficiency: Secondary | ICD-10-CM | POA: Diagnosis not present

## 2020-12-28 DIAGNOSIS — R55 Syncope and collapse: Secondary | ICD-10-CM | POA: Diagnosis not present

## 2020-12-28 DIAGNOSIS — E46 Unspecified protein-calorie malnutrition: Secondary | ICD-10-CM | POA: Diagnosis not present

## 2020-12-29 DIAGNOSIS — R609 Edema, unspecified: Secondary | ICD-10-CM | POA: Diagnosis not present

## 2020-12-29 DIAGNOSIS — E46 Unspecified protein-calorie malnutrition: Secondary | ICD-10-CM | POA: Diagnosis not present

## 2020-12-29 DIAGNOSIS — R55 Syncope and collapse: Secondary | ICD-10-CM | POA: Diagnosis not present

## 2020-12-29 DIAGNOSIS — R531 Weakness: Secondary | ICD-10-CM | POA: Diagnosis not present

## 2020-12-29 DIAGNOSIS — I950 Idiopathic hypotension: Secondary | ICD-10-CM | POA: Diagnosis not present

## 2020-12-30 ENCOUNTER — Telehealth: Payer: Self-pay | Admitting: *Deleted

## 2020-12-30 DIAGNOSIS — R55 Syncope and collapse: Secondary | ICD-10-CM | POA: Diagnosis not present

## 2020-12-30 DIAGNOSIS — E46 Unspecified protein-calorie malnutrition: Secondary | ICD-10-CM | POA: Diagnosis not present

## 2020-12-30 DIAGNOSIS — R8290 Unspecified abnormal findings in urine: Secondary | ICD-10-CM | POA: Diagnosis not present

## 2020-12-30 DIAGNOSIS — R609 Edema, unspecified: Secondary | ICD-10-CM | POA: Diagnosis not present

## 2020-12-30 DIAGNOSIS — I950 Idiopathic hypotension: Secondary | ICD-10-CM | POA: Diagnosis not present

## 2020-12-30 NOTE — Telephone Encounter (Signed)
Incoming call from [place] NP. [phone]. Patient currently at [facility] and the NP would like to talk directly to Dr. Tasia Catchings about the patient's care.

## 2020-12-31 DIAGNOSIS — E46 Unspecified protein-calorie malnutrition: Secondary | ICD-10-CM | POA: Diagnosis not present

## 2020-12-31 DIAGNOSIS — R55 Syncope and collapse: Secondary | ICD-10-CM | POA: Diagnosis not present

## 2020-12-31 DIAGNOSIS — I950 Idiopathic hypotension: Secondary | ICD-10-CM | POA: Diagnosis not present

## 2020-12-31 DIAGNOSIS — R609 Edema, unspecified: Secondary | ICD-10-CM | POA: Diagnosis not present

## 2020-12-31 NOTE — Telephone Encounter (Signed)
Mailed appt to house phone doesn't have VM and there is no answer

## 2021-01-01 DIAGNOSIS — E46 Unspecified protein-calorie malnutrition: Secondary | ICD-10-CM | POA: Diagnosis not present

## 2021-01-01 DIAGNOSIS — R55 Syncope and collapse: Secondary | ICD-10-CM | POA: Diagnosis not present

## 2021-01-01 DIAGNOSIS — I950 Idiopathic hypotension: Secondary | ICD-10-CM | POA: Diagnosis not present

## 2021-01-01 DIAGNOSIS — R296 Repeated falls: Secondary | ICD-10-CM | POA: Diagnosis not present

## 2021-01-01 DIAGNOSIS — R609 Edema, unspecified: Secondary | ICD-10-CM | POA: Diagnosis not present

## 2021-01-02 DIAGNOSIS — R55 Syncope and collapse: Secondary | ICD-10-CM | POA: Diagnosis not present

## 2021-01-02 DIAGNOSIS — I950 Idiopathic hypotension: Secondary | ICD-10-CM | POA: Diagnosis not present

## 2021-01-02 DIAGNOSIS — R296 Repeated falls: Secondary | ICD-10-CM | POA: Diagnosis not present

## 2021-01-02 DIAGNOSIS — E46 Unspecified protein-calorie malnutrition: Secondary | ICD-10-CM | POA: Diagnosis not present

## 2021-01-03 DIAGNOSIS — R296 Repeated falls: Secondary | ICD-10-CM | POA: Diagnosis not present

## 2021-01-03 DIAGNOSIS — I950 Idiopathic hypotension: Secondary | ICD-10-CM | POA: Diagnosis not present

## 2021-01-03 DIAGNOSIS — R531 Weakness: Secondary | ICD-10-CM | POA: Diagnosis not present

## 2021-01-03 DIAGNOSIS — E46 Unspecified protein-calorie malnutrition: Secondary | ICD-10-CM | POA: Diagnosis not present

## 2021-01-03 DIAGNOSIS — R55 Syncope and collapse: Secondary | ICD-10-CM | POA: Diagnosis not present

## 2021-01-04 DIAGNOSIS — I950 Idiopathic hypotension: Secondary | ICD-10-CM | POA: Diagnosis not present

## 2021-01-04 DIAGNOSIS — R55 Syncope and collapse: Secondary | ICD-10-CM | POA: Diagnosis not present

## 2021-01-04 DIAGNOSIS — R296 Repeated falls: Secondary | ICD-10-CM | POA: Diagnosis not present

## 2021-01-04 DIAGNOSIS — E46 Unspecified protein-calorie malnutrition: Secondary | ICD-10-CM | POA: Diagnosis not present

## 2021-01-05 DIAGNOSIS — R609 Edema, unspecified: Secondary | ICD-10-CM | POA: Diagnosis not present

## 2021-01-05 DIAGNOSIS — R55 Syncope and collapse: Secondary | ICD-10-CM | POA: Diagnosis not present

## 2021-01-05 DIAGNOSIS — E46 Unspecified protein-calorie malnutrition: Secondary | ICD-10-CM | POA: Diagnosis not present

## 2021-01-05 DIAGNOSIS — R296 Repeated falls: Secondary | ICD-10-CM | POA: Diagnosis not present

## 2021-01-05 DIAGNOSIS — I950 Idiopathic hypotension: Secondary | ICD-10-CM | POA: Diagnosis not present

## 2021-01-06 DIAGNOSIS — R55 Syncope and collapse: Secondary | ICD-10-CM | POA: Diagnosis not present

## 2021-01-06 DIAGNOSIS — E46 Unspecified protein-calorie malnutrition: Secondary | ICD-10-CM | POA: Diagnosis not present

## 2021-01-06 DIAGNOSIS — I950 Idiopathic hypotension: Secondary | ICD-10-CM | POA: Diagnosis not present

## 2021-01-06 DIAGNOSIS — R296 Repeated falls: Secondary | ICD-10-CM | POA: Diagnosis not present

## 2021-01-07 DIAGNOSIS — E46 Unspecified protein-calorie malnutrition: Secondary | ICD-10-CM | POA: Diagnosis not present

## 2021-01-07 DIAGNOSIS — R296 Repeated falls: Secondary | ICD-10-CM | POA: Diagnosis not present

## 2021-01-07 DIAGNOSIS — R600 Localized edema: Secondary | ICD-10-CM | POA: Diagnosis not present

## 2021-01-07 DIAGNOSIS — I950 Idiopathic hypotension: Secondary | ICD-10-CM | POA: Diagnosis not present

## 2021-01-07 DIAGNOSIS — R55 Syncope and collapse: Secondary | ICD-10-CM | POA: Diagnosis not present

## 2021-01-08 DIAGNOSIS — R531 Weakness: Secondary | ICD-10-CM | POA: Diagnosis not present

## 2021-01-09 DIAGNOSIS — R55 Syncope and collapse: Secondary | ICD-10-CM | POA: Diagnosis not present

## 2021-01-09 DIAGNOSIS — R600 Localized edema: Secondary | ICD-10-CM | POA: Diagnosis not present

## 2021-01-09 DIAGNOSIS — D539 Nutritional anemia, unspecified: Secondary | ICD-10-CM | POA: Diagnosis not present

## 2021-01-09 DIAGNOSIS — R296 Repeated falls: Secondary | ICD-10-CM | POA: Diagnosis not present

## 2021-01-09 DIAGNOSIS — E46 Unspecified protein-calorie malnutrition: Secondary | ICD-10-CM | POA: Diagnosis not present

## 2021-01-09 DIAGNOSIS — I950 Idiopathic hypotension: Secondary | ICD-10-CM | POA: Diagnosis not present

## 2021-01-10 DIAGNOSIS — R55 Syncope and collapse: Secondary | ICD-10-CM | POA: Diagnosis not present

## 2021-01-10 DIAGNOSIS — R600 Localized edema: Secondary | ICD-10-CM | POA: Diagnosis not present

## 2021-01-10 DIAGNOSIS — D539 Nutritional anemia, unspecified: Secondary | ICD-10-CM | POA: Diagnosis not present

## 2021-01-10 DIAGNOSIS — G909 Disorder of the autonomic nervous system, unspecified: Secondary | ICD-10-CM | POA: Diagnosis present

## 2021-01-10 DIAGNOSIS — E43 Unspecified severe protein-calorie malnutrition: Secondary | ICD-10-CM | POA: Diagnosis not present

## 2021-01-10 DIAGNOSIS — I950 Idiopathic hypotension: Secondary | ICD-10-CM | POA: Diagnosis not present

## 2021-01-10 DIAGNOSIS — R296 Repeated falls: Secondary | ICD-10-CM | POA: Diagnosis not present

## 2021-01-11 ENCOUNTER — Telehealth: Payer: Self-pay | Admitting: Oncology

## 2021-01-11 DIAGNOSIS — R296 Repeated falls: Secondary | ICD-10-CM | POA: Diagnosis not present

## 2021-01-11 DIAGNOSIS — D539 Nutritional anemia, unspecified: Secondary | ICD-10-CM | POA: Diagnosis not present

## 2021-01-11 DIAGNOSIS — I950 Idiopathic hypotension: Secondary | ICD-10-CM | POA: Diagnosis not present

## 2021-01-11 DIAGNOSIS — R600 Localized edema: Secondary | ICD-10-CM | POA: Diagnosis not present

## 2021-01-11 DIAGNOSIS — E43 Unspecified severe protein-calorie malnutrition: Secondary | ICD-10-CM | POA: Diagnosis not present

## 2021-01-11 DIAGNOSIS — R55 Syncope and collapse: Secondary | ICD-10-CM | POA: Diagnosis not present

## 2021-01-11 NOTE — Telephone Encounter (Signed)
Called to reschedule appointment scheduled on 5/27.   Nephew Charles Flores answered and states that the patient is currently admitted at [facility] since 5/9 and will establish care there.  Patient will no longer be coming to [facility] for care.  Canceling future appointments per request.

## 2021-01-12 DIAGNOSIS — Z66 Do not resuscitate: Secondary | ICD-10-CM | POA: Diagnosis not present

## 2021-01-12 DIAGNOSIS — Z8546 Personal history of malignant neoplasm of prostate: Secondary | ICD-10-CM | POA: Diagnosis not present

## 2021-01-12 DIAGNOSIS — J9601 Acute respiratory failure with hypoxia: Secondary | ICD-10-CM | POA: Diagnosis not present

## 2021-01-12 DIAGNOSIS — G909 Disorder of the autonomic nervous system, unspecified: Secondary | ICD-10-CM | POA: Diagnosis not present

## 2021-01-12 DIAGNOSIS — I361 Nonrheumatic tricuspid (valve) insufficiency: Secondary | ICD-10-CM | POA: Diagnosis not present

## 2021-01-12 DIAGNOSIS — I950 Idiopathic hypotension: Secondary | ICD-10-CM | POA: Diagnosis not present

## 2021-01-12 DIAGNOSIS — R55 Syncope and collapse: Secondary | ICD-10-CM | POA: Diagnosis not present

## 2021-01-12 DIAGNOSIS — E785 Hyperlipidemia, unspecified: Secondary | ICD-10-CM | POA: Diagnosis not present

## 2021-01-12 DIAGNOSIS — R0602 Shortness of breath: Secondary | ICD-10-CM | POA: Diagnosis not present

## 2021-01-12 DIAGNOSIS — D649 Anemia, unspecified: Secondary | ICD-10-CM | POA: Diagnosis not present

## 2021-01-12 DIAGNOSIS — J9811 Atelectasis: Secondary | ICD-10-CM | POA: Diagnosis not present

## 2021-01-12 DIAGNOSIS — R4020 Unspecified coma: Secondary | ICD-10-CM | POA: Diagnosis not present

## 2021-01-12 DIAGNOSIS — R5381 Other malaise: Secondary | ICD-10-CM | POA: Diagnosis not present

## 2021-01-12 DIAGNOSIS — E7800 Pure hypercholesterolemia, unspecified: Secondary | ICD-10-CM | POA: Diagnosis not present

## 2021-01-12 DIAGNOSIS — Z1159 Encounter for screening for other viral diseases: Secondary | ICD-10-CM | POA: Diagnosis not present

## 2021-01-12 DIAGNOSIS — I2510 Atherosclerotic heart disease of native coronary artery without angina pectoris: Secondary | ICD-10-CM | POA: Diagnosis not present

## 2021-01-12 DIAGNOSIS — R627 Adult failure to thrive: Secondary | ICD-10-CM | POA: Diagnosis not present

## 2021-01-12 DIAGNOSIS — R54 Age-related physical debility: Secondary | ICD-10-CM | POA: Diagnosis not present

## 2021-01-12 DIAGNOSIS — I2699 Other pulmonary embolism without acute cor pulmonale: Secondary | ICD-10-CM | POA: Diagnosis not present

## 2021-01-12 DIAGNOSIS — E538 Deficiency of other specified B group vitamins: Secondary | ICD-10-CM | POA: Diagnosis not present

## 2021-01-12 DIAGNOSIS — E861 Hypovolemia: Secondary | ICD-10-CM | POA: Diagnosis not present

## 2021-01-12 DIAGNOSIS — Z743 Need for continuous supervision: Secondary | ICD-10-CM | POA: Diagnosis not present

## 2021-01-12 DIAGNOSIS — R911 Solitary pulmonary nodule: Secondary | ICD-10-CM | POA: Diagnosis not present

## 2021-01-12 DIAGNOSIS — I499 Cardiac arrhythmia, unspecified: Secondary | ICD-10-CM | POA: Diagnosis not present

## 2021-01-12 DIAGNOSIS — R600 Localized edema: Secondary | ICD-10-CM | POA: Diagnosis not present

## 2021-01-12 DIAGNOSIS — N1832 Chronic kidney disease, stage 3b: Secondary | ICD-10-CM | POA: Diagnosis not present

## 2021-01-12 DIAGNOSIS — E43 Unspecified severe protein-calorie malnutrition: Secondary | ICD-10-CM | POA: Diagnosis not present

## 2021-01-12 DIAGNOSIS — M6281 Muscle weakness (generalized): Secondary | ICD-10-CM | POA: Diagnosis not present

## 2021-01-12 DIAGNOSIS — J90 Pleural effusion, not elsewhere classified: Secondary | ICD-10-CM | POA: Diagnosis not present

## 2021-01-12 DIAGNOSIS — D539 Nutritional anemia, unspecified: Secondary | ICD-10-CM | POA: Diagnosis not present

## 2021-01-12 DIAGNOSIS — I951 Orthostatic hypotension: Secondary | ICD-10-CM | POA: Diagnosis not present

## 2021-01-12 DIAGNOSIS — R571 Hypovolemic shock: Secondary | ICD-10-CM | POA: Diagnosis not present

## 2021-01-12 DIAGNOSIS — R279 Unspecified lack of coordination: Secondary | ICD-10-CM | POA: Diagnosis not present

## 2021-01-12 DIAGNOSIS — G40909 Epilepsy, unspecified, not intractable, without status epilepticus: Secondary | ICD-10-CM | POA: Diagnosis present

## 2021-01-12 DIAGNOSIS — K219 Gastro-esophageal reflux disease without esophagitis: Secondary | ICD-10-CM | POA: Diagnosis not present

## 2021-01-12 DIAGNOSIS — Z21 Asymptomatic human immunodeficiency virus [HIV] infection status: Secondary | ICD-10-CM | POA: Diagnosis present

## 2021-01-12 DIAGNOSIS — I719 Aortic aneurysm of unspecified site, without rupture: Secondary | ICD-10-CM | POA: Diagnosis not present

## 2021-01-12 DIAGNOSIS — R6889 Other general symptoms and signs: Secondary | ICD-10-CM | POA: Diagnosis not present

## 2021-01-12 DIAGNOSIS — R0902 Hypoxemia: Secondary | ICD-10-CM | POA: Diagnosis not present

## 2021-01-12 DIAGNOSIS — Z515 Encounter for palliative care: Secondary | ICD-10-CM | POA: Diagnosis not present

## 2021-01-12 DIAGNOSIS — E46 Unspecified protein-calorie malnutrition: Secondary | ICD-10-CM | POA: Diagnosis not present

## 2021-01-12 DIAGNOSIS — D631 Anemia in chronic kidney disease: Secondary | ICD-10-CM | POA: Diagnosis not present

## 2021-01-12 DIAGNOSIS — R680 Hypothermia, not associated with low environmental temperature: Secondary | ICD-10-CM | POA: Diagnosis not present

## 2021-01-12 DIAGNOSIS — N1831 Chronic kidney disease, stage 3a: Secondary | ICD-10-CM | POA: Diagnosis not present

## 2021-01-12 DIAGNOSIS — R42 Dizziness and giddiness: Secondary | ICD-10-CM | POA: Diagnosis not present

## 2021-01-12 DIAGNOSIS — E872 Acidosis: Secondary | ICD-10-CM | POA: Diagnosis not present

## 2021-01-12 DIAGNOSIS — R079 Chest pain, unspecified: Secondary | ICD-10-CM | POA: Diagnosis not present

## 2021-01-12 DIAGNOSIS — N179 Acute kidney failure, unspecified: Secondary | ICD-10-CM | POA: Diagnosis not present

## 2021-01-12 DIAGNOSIS — Z20822 Contact with and (suspected) exposure to covid-19: Secondary | ICD-10-CM | POA: Diagnosis not present

## 2021-01-12 DIAGNOSIS — R296 Repeated falls: Secondary | ICD-10-CM | POA: Diagnosis not present

## 2021-01-12 DIAGNOSIS — M1990 Unspecified osteoarthritis, unspecified site: Secondary | ICD-10-CM | POA: Diagnosis not present

## 2021-01-12 DIAGNOSIS — N289 Disorder of kidney and ureter, unspecified: Secondary | ICD-10-CM | POA: Diagnosis not present

## 2021-01-13 DIAGNOSIS — R5381 Other malaise: Secondary | ICD-10-CM | POA: Diagnosis not present

## 2021-01-13 DIAGNOSIS — G909 Disorder of the autonomic nervous system, unspecified: Secondary | ICD-10-CM | POA: Diagnosis not present

## 2021-01-13 DIAGNOSIS — I2510 Atherosclerotic heart disease of native coronary artery without angina pectoris: Secondary | ICD-10-CM | POA: Diagnosis not present

## 2021-01-13 DIAGNOSIS — E7800 Pure hypercholesterolemia, unspecified: Secondary | ICD-10-CM | POA: Diagnosis not present

## 2021-01-14 ENCOUNTER — Inpatient Hospital Stay: Payer: Medicare Other | Admitting: Oncology

## 2021-01-14 DIAGNOSIS — D649 Anemia, unspecified: Secondary | ICD-10-CM | POA: Diagnosis not present

## 2021-01-18 DIAGNOSIS — I950 Idiopathic hypotension: Secondary | ICD-10-CM | POA: Diagnosis not present

## 2021-01-18 DIAGNOSIS — I951 Orthostatic hypotension: Secondary | ICD-10-CM | POA: Diagnosis not present

## 2021-01-18 DIAGNOSIS — N1831 Chronic kidney disease, stage 3a: Secondary | ICD-10-CM | POA: Diagnosis not present

## 2021-01-18 DIAGNOSIS — R911 Solitary pulmonary nodule: Secondary | ICD-10-CM | POA: Diagnosis not present

## 2021-01-18 DIAGNOSIS — E43 Unspecified severe protein-calorie malnutrition: Secondary | ICD-10-CM | POA: Diagnosis not present

## 2021-01-18 DIAGNOSIS — R55 Syncope and collapse: Secondary | ICD-10-CM | POA: Diagnosis not present

## 2021-01-18 DIAGNOSIS — R42 Dizziness and giddiness: Secondary | ICD-10-CM | POA: Diagnosis not present

## 2021-01-19 DIAGNOSIS — R55 Syncope and collapse: Secondary | ICD-10-CM | POA: Diagnosis not present

## 2021-01-21 ENCOUNTER — Other Ambulatory Visit (HOSPITAL_COMMUNITY): Payer: Self-pay

## 2021-01-21 DIAGNOSIS — D649 Anemia, unspecified: Secondary | ICD-10-CM | POA: Diagnosis not present

## 2021-01-24 ENCOUNTER — Other Ambulatory Visit (HOSPITAL_COMMUNITY): Payer: Self-pay

## 2021-01-24 DIAGNOSIS — I950 Idiopathic hypotension: Secondary | ICD-10-CM | POA: Diagnosis not present

## 2021-01-26 ENCOUNTER — Other Ambulatory Visit (HOSPITAL_COMMUNITY): Payer: Self-pay

## 2021-01-26 DIAGNOSIS — D649 Anemia, unspecified: Secondary | ICD-10-CM | POA: Diagnosis not present

## 2021-01-28 ENCOUNTER — Inpatient Hospital Stay
Admission: EM | Admit: 2021-01-28 | Discharge: 2021-01-29 | DRG: 175 | Disposition: A | Discharge status: Hospice | Payer: Medicare Other | Source: Skilled Nursing Facility | Attending: Internal Medicine | Admitting: Internal Medicine

## 2021-01-28 ENCOUNTER — Encounter: Payer: Self-pay | Admitting: Emergency Medicine

## 2021-01-28 ENCOUNTER — Emergency Department: Payer: Medicare Other

## 2021-01-28 ENCOUNTER — Other Ambulatory Visit: Payer: Self-pay

## 2021-01-28 DIAGNOSIS — R54 Age-related physical debility: Secondary | ICD-10-CM | POA: Diagnosis not present

## 2021-01-28 DIAGNOSIS — E46 Unspecified protein-calorie malnutrition: Secondary | ICD-10-CM | POA: Diagnosis not present

## 2021-01-28 DIAGNOSIS — R571 Hypovolemic shock: Secondary | ICD-10-CM | POA: Diagnosis not present

## 2021-01-28 DIAGNOSIS — J9601 Acute respiratory failure with hypoxia: Secondary | ICD-10-CM | POA: Diagnosis not present

## 2021-01-28 DIAGNOSIS — I499 Cardiac arrhythmia, unspecified: Secondary | ICD-10-CM | POA: Diagnosis not present

## 2021-01-28 DIAGNOSIS — Z833 Family history of diabetes mellitus: Secondary | ICD-10-CM

## 2021-01-28 DIAGNOSIS — M2550 Pain in unspecified joint: Secondary | ICD-10-CM | POA: Diagnosis not present

## 2021-01-28 DIAGNOSIS — D631 Anemia in chronic kidney disease: Secondary | ICD-10-CM | POA: Diagnosis present

## 2021-01-28 DIAGNOSIS — N289 Disorder of kidney and ureter, unspecified: Secondary | ICD-10-CM | POA: Diagnosis not present

## 2021-01-28 DIAGNOSIS — Z8546 Personal history of malignant neoplasm of prostate: Secondary | ICD-10-CM | POA: Diagnosis not present

## 2021-01-28 DIAGNOSIS — R680 Hypothermia, not associated with low environmental temperature: Secondary | ICD-10-CM | POA: Diagnosis present

## 2021-01-28 DIAGNOSIS — G40909 Epilepsy, unspecified, not intractable, without status epilepticus: Secondary | ICD-10-CM | POA: Diagnosis present

## 2021-01-28 DIAGNOSIS — Z21 Asymptomatic human immunodeficiency virus [HIV] infection status: Secondary | ICD-10-CM

## 2021-01-28 DIAGNOSIS — D649 Anemia, unspecified: Secondary | ICD-10-CM | POA: Diagnosis not present

## 2021-01-28 DIAGNOSIS — R404 Transient alteration of awareness: Secondary | ICD-10-CM | POA: Diagnosis not present

## 2021-01-28 DIAGNOSIS — I951 Orthostatic hypotension: Secondary | ICD-10-CM | POA: Insufficient documentation

## 2021-01-28 DIAGNOSIS — J9811 Atelectasis: Secondary | ICD-10-CM | POA: Diagnosis present

## 2021-01-28 DIAGNOSIS — R627 Adult failure to thrive: Secondary | ICD-10-CM | POA: Diagnosis not present

## 2021-01-28 DIAGNOSIS — J90 Pleural effusion, not elsewhere classified: Secondary | ICD-10-CM | POA: Diagnosis not present

## 2021-01-28 DIAGNOSIS — Z841 Family history of disorders of kidney and ureter: Secondary | ICD-10-CM

## 2021-01-28 DIAGNOSIS — E861 Hypovolemia: Secondary | ICD-10-CM | POA: Diagnosis present

## 2021-01-28 DIAGNOSIS — I2699 Other pulmonary embolism without acute cor pulmonale: Secondary | ICD-10-CM | POA: Diagnosis not present

## 2021-01-28 DIAGNOSIS — Z515 Encounter for palliative care: Secondary | ICD-10-CM | POA: Diagnosis not present

## 2021-01-28 DIAGNOSIS — Z66 Do not resuscitate: Secondary | ICD-10-CM | POA: Diagnosis present

## 2021-01-28 DIAGNOSIS — M1990 Unspecified osteoarthritis, unspecified site: Secondary | ICD-10-CM | POA: Diagnosis not present

## 2021-01-28 DIAGNOSIS — R0902 Hypoxemia: Secondary | ICD-10-CM | POA: Diagnosis not present

## 2021-01-28 DIAGNOSIS — N1832 Chronic kidney disease, stage 3b: Secondary | ICD-10-CM | POA: Diagnosis present

## 2021-01-28 DIAGNOSIS — G909 Disorder of the autonomic nervous system, unspecified: Secondary | ICD-10-CM | POA: Diagnosis present

## 2021-01-28 DIAGNOSIS — I2510 Atherosclerotic heart disease of native coronary artery without angina pectoris: Secondary | ICD-10-CM | POA: Diagnosis not present

## 2021-01-28 DIAGNOSIS — R6889 Other general symptoms and signs: Secondary | ICD-10-CM | POA: Diagnosis not present

## 2021-01-28 DIAGNOSIS — N179 Acute kidney failure, unspecified: Secondary | ICD-10-CM | POA: Diagnosis not present

## 2021-01-28 DIAGNOSIS — K219 Gastro-esophageal reflux disease without esophagitis: Secondary | ICD-10-CM | POA: Diagnosis not present

## 2021-01-28 DIAGNOSIS — E872 Acidosis: Secondary | ICD-10-CM | POA: Diagnosis present

## 2021-01-28 DIAGNOSIS — Z20822 Contact with and (suspected) exposure to covid-19: Secondary | ICD-10-CM | POA: Diagnosis present

## 2021-01-28 DIAGNOSIS — R079 Chest pain, unspecified: Secondary | ICD-10-CM | POA: Diagnosis not present

## 2021-01-28 DIAGNOSIS — I361 Nonrheumatic tricuspid (valve) insufficiency: Secondary | ICD-10-CM | POA: Diagnosis not present

## 2021-01-28 DIAGNOSIS — I719 Aortic aneurysm of unspecified site, without rupture: Secondary | ICD-10-CM | POA: Diagnosis not present

## 2021-01-28 DIAGNOSIS — Z79890 Hormone replacement therapy (postmenopausal): Secondary | ICD-10-CM

## 2021-01-28 DIAGNOSIS — Z7401 Bed confinement status: Secondary | ICD-10-CM | POA: Diagnosis not present

## 2021-01-28 DIAGNOSIS — R0602 Shortness of breath: Secondary | ICD-10-CM | POA: Diagnosis not present

## 2021-01-28 DIAGNOSIS — Z803 Family history of malignant neoplasm of breast: Secondary | ICD-10-CM

## 2021-01-28 DIAGNOSIS — Z7952 Long term (current) use of systemic steroids: Secondary | ICD-10-CM

## 2021-01-28 DIAGNOSIS — Z79899 Other long term (current) drug therapy: Secondary | ICD-10-CM

## 2021-01-28 DIAGNOSIS — Z800 Family history of malignant neoplasm of digestive organs: Secondary | ICD-10-CM

## 2021-01-28 DIAGNOSIS — E785 Hyperlipidemia, unspecified: Secondary | ICD-10-CM | POA: Diagnosis not present

## 2021-01-28 DIAGNOSIS — Z87891 Personal history of nicotine dependence: Secondary | ICD-10-CM

## 2021-01-28 DIAGNOSIS — R778 Other specified abnormalities of plasma proteins: Secondary | ICD-10-CM | POA: Diagnosis present

## 2021-01-28 DIAGNOSIS — Z823 Family history of stroke: Secondary | ICD-10-CM

## 2021-01-28 DIAGNOSIS — R4020 Unspecified coma: Secondary | ICD-10-CM | POA: Diagnosis not present

## 2021-01-28 DIAGNOSIS — Z743 Need for continuous supervision: Secondary | ICD-10-CM | POA: Diagnosis not present

## 2021-01-28 DIAGNOSIS — B20 Human immunodeficiency virus [HIV] disease: Secondary | ICD-10-CM

## 2021-01-28 HISTORY — DX: Asymptomatic human immunodeficiency virus (hiv) infection status: Z21

## 2021-01-28 HISTORY — DX: Human immunodeficiency virus (HIV) disease: B20

## 2021-01-28 HISTORY — DX: Epilepsy, unspecified, not intractable, without status epilepticus: G40.909

## 2021-01-28 LAB — BLOOD GAS, ARTERIAL
Acid-base deficit: 10.8 mmol/L — ABNORMAL HIGH (ref 0.0–2.0)
Bicarbonate: 15.3 mmol/L — ABNORMAL LOW (ref 20.0–28.0)
Delivery systems: POSITIVE
Expiratory PAP: 5
FIO2: 1
O2 Saturation: 80.3 %
Patient temperature: 37
pCO2 arterial: 34 mmHg (ref 32.0–48.0)
pH, Arterial: 7.26 — ABNORMAL LOW (ref 7.350–7.450)
pO2, Arterial: 52 mmHg — ABNORMAL LOW (ref 83.0–108.0)

## 2021-01-28 LAB — CBC WITH DIFFERENTIAL/PLATELET
Abs Immature Granulocytes: 0.07 10*3/uL (ref 0.00–0.07)
Basophils Absolute: 0 10*3/uL (ref 0.0–0.1)
Basophils Relative: 0 %
Eosinophils Absolute: 0 10*3/uL (ref 0.0–0.5)
Eosinophils Relative: 0 %
HCT: 27.5 % — ABNORMAL LOW (ref 39.0–52.0)
Hemoglobin: 9.2 g/dL — ABNORMAL LOW (ref 13.0–17.0)
Immature Granulocytes: 1 %
Lymphocytes Relative: 6 %
Lymphs Abs: 0.5 10*3/uL — ABNORMAL LOW (ref 0.7–4.0)
MCH: 34.5 pg — ABNORMAL HIGH (ref 26.0–34.0)
MCHC: 33.5 g/dL (ref 30.0–36.0)
MCV: 103 fL — ABNORMAL HIGH (ref 80.0–100.0)
Monocytes Absolute: 1.1 10*3/uL — ABNORMAL HIGH (ref 0.1–1.0)
Monocytes Relative: 12 %
Neutro Abs: 7.5 10*3/uL (ref 1.7–7.7)
Neutrophils Relative %: 81 %
Platelets: 252 10*3/uL (ref 150–400)
RBC: 2.67 MIL/uL — ABNORMAL LOW (ref 4.22–5.81)
RDW: 13.8 % (ref 11.5–15.5)
WBC: 9.1 10*3/uL (ref 4.0–10.5)
nRBC: 0 % (ref 0.0–0.2)

## 2021-01-28 LAB — COMPREHENSIVE METABOLIC PANEL
ALT: 30 U/L (ref 0–44)
AST: 55 U/L — ABNORMAL HIGH (ref 15–41)
Albumin: 3.2 g/dL — ABNORMAL LOW (ref 3.5–5.0)
Alkaline Phosphatase: 88 U/L (ref 38–126)
Anion gap: 18 — ABNORMAL HIGH (ref 5–15)
BUN: 47 mg/dL — ABNORMAL HIGH (ref 8–23)
CO2: 15 mmol/L — ABNORMAL LOW (ref 22–32)
Calcium: 8.8 mg/dL — ABNORMAL LOW (ref 8.9–10.3)
Chloride: 103 mmol/L (ref 98–111)
Creatinine, Ser: 2.2 mg/dL — ABNORMAL HIGH (ref 0.61–1.24)
GFR, Estimated: 27 mL/min — ABNORMAL LOW (ref >60)
Glucose, Bld: 111 mg/dL — ABNORMAL HIGH (ref 70–99)
Potassium: 5.3 mmol/L — ABNORMAL HIGH (ref 3.5–5.1)
Sodium: 136 mmol/L (ref 135–145)
Total Bilirubin: 0.8 mg/dL (ref 0.3–1.2)
Total Protein: 6.4 g/dL — ABNORMAL LOW (ref 6.5–8.1)

## 2021-01-28 LAB — PROTIME-INR
INR: 1.1 (ref 0.8–1.2)
Prothrombin Time: 14 seconds (ref 11.4–15.2)

## 2021-01-28 LAB — RESP PANEL BY RT-PCR (FLU A&B, COVID) ARPGX2
Influenza A by PCR: NEGATIVE
Influenza B by PCR: NEGATIVE
SARS Coronavirus 2 by RT PCR: NEGATIVE

## 2021-01-28 LAB — TROPONIN I (HIGH SENSITIVITY)
Troponin I (High Sensitivity): 531 ng/L (ref <18)
Troponin I (High Sensitivity): 669 ng/L (ref <18)

## 2021-01-28 LAB — LACTIC ACID, PLASMA
Lactic Acid, Venous: 9.1 mmol/L (ref 0.5–1.9)
Lactic Acid, Venous: 3.8 mmol/L (ref 0.5–1.9)
Lactic Acid, Venous: 2.8 mmol/L (ref 0.5–1.9)

## 2021-01-28 LAB — TSH: TSH: 12.071 u[IU]/mL — ABNORMAL HIGH (ref 0.350–4.500)

## 2021-01-28 LAB — BRAIN NATRIURETIC PEPTIDE: B Natriuretic Peptide: 1519.2 pg/mL — ABNORMAL HIGH (ref 0.0–100.0)

## 2021-01-28 LAB — MRSA PCR SCREENING: MRSA by PCR: NEGATIVE

## 2021-01-28 MED ORDER — PANTOPRAZOLE SODIUM 40 MG IV SOLR
40.0000 mg | Freq: Two times a day (BID) | INTRAVENOUS | Status: DC
  Administered 2021-01-28 – 2021-01-29 (×2): 40 mg via INTRAVENOUS
  Filled 2021-01-28 (×2): qty 40

## 2021-01-28 MED ORDER — HEPARIN BOLUS VIA INFUSION
5000.0000 [IU] | Freq: Once | INTRAVENOUS | Status: AC
  Administered 2021-01-28: 5000 [IU] via INTRAVENOUS
  Filled 2021-01-28: qty 5000

## 2021-01-28 MED ORDER — NOREPINEPHRINE 4 MG/250ML-% IV SOLN
0.0000 ug/min | INTRAVENOUS | Status: DC
  Administered 2021-01-28: 2 ug/min via INTRAVENOUS
  Filled 2021-01-28: qty 250

## 2021-01-28 MED ORDER — ACETAMINOPHEN 650 MG RE SUPP: 650.0000 mg | Freq: Four times a day (QID) | RECTAL | Status: DC | PRN

## 2021-01-28 MED ORDER — SODIUM CHLORIDE 0.9 % IV BOLUS (SEPSIS)
1000.0000 mL | Freq: Once | INTRAVENOUS | Status: AC
  Administered 2021-01-28: 1000 mL via INTRAVENOUS

## 2021-01-28 MED ORDER — VANCOMYCIN HCL IN DEXTROSE 1-5 GM/200ML-% IV SOLN: 1000.0000 mg | Freq: Once | INTRAVENOUS | Status: DC

## 2021-01-28 MED ORDER — SODIUM CHLORIDE 0.9 % IV SOLN
2.0000 g | Freq: Once | INTRAVENOUS | Status: AC
  Administered 2021-01-28: 2 g via INTRAVENOUS
  Filled 2021-01-28: qty 2

## 2021-01-28 MED ORDER — HEPARIN (PORCINE) 25000 UT/250ML-% IV SOLN
1333.0000 [IU]/h | INTRAVENOUS | Status: DC
  Administered 2021-01-28: 1350 [IU]/h via INTRAVENOUS
  Filled 2021-01-28: qty 250

## 2021-01-28 MED ORDER — SODIUM CHLORIDE 0.9 % IV SOLN
2.0000 g | Freq: Two times a day (BID) | INTRAVENOUS | Status: DC
  Administered 2021-01-29: 2 g via INTRAVENOUS
  Filled 2021-01-28: qty 2

## 2021-01-28 MED ORDER — SODIUM CHLORIDE 0.9 % IV SOLN: Freq: Once | INTRAVENOUS | Status: AC

## 2021-01-28 MED ORDER — ACETAMINOPHEN 325 MG PO TABS: 650.0000 mg | ORAL_TABLET | Freq: Four times a day (QID) | ORAL | Status: DC | PRN

## 2021-01-28 MED ORDER — HYDROCORTISONE NA SUCCINATE PF 100 MG IJ SOLR
50.0000 mg | Freq: Four times a day (QID) | INTRAMUSCULAR | Status: DC
  Administered 2021-01-28 – 2021-01-29 (×4): 50 mg via INTRAVENOUS
  Filled 2021-01-28 (×4): qty 2

## 2021-01-28 MED ORDER — VANCOMYCIN HCL 1500 MG/300ML IV SOLN
1500.0000 mg | Freq: Once | INTRAVENOUS | Status: AC
  Administered 2021-01-28: 1500 mg via INTRAVENOUS
  Filled 2021-01-28: qty 300

## 2021-01-28 MED ORDER — SODIUM CHLORIDE 0.9 % IV BOLUS (SEPSIS)
250.0000 mL | Freq: Once | INTRAVENOUS | Status: AC
  Administered 2021-01-28: 250 mL via INTRAVENOUS

## 2021-01-28 MED ORDER — VANCOMYCIN VARIABLE DOSE PER UNSTABLE RENAL FUNCTION (PHARMACIST DOSING): Status: DC

## 2021-01-28 MED ORDER — HEPARIN BOLUS VIA INFUSION
4450.0000 [IU] | Freq: Once | INTRAVENOUS | Status: DC
  Filled 2021-01-28: qty 4450

## 2021-01-28 MED ORDER — SODIUM CHLORIDE 0.9 % IV SOLN: 2.0000 g | Freq: Once | INTRAVENOUS | Status: DC

## 2021-01-28 MED ORDER — SODIUM CHLORIDE 0.9 % IV BOLUS (SEPSIS): 1000.0000 mL | Freq: Once | INTRAVENOUS | Status: DC

## 2021-01-28 MED ORDER — SODIUM CHLORIDE 0.9 % IV BOLUS
1000.0000 mL | Freq: Once | INTRAVENOUS | Status: AC
  Administered 2021-01-28: 1000 mL via INTRAVENOUS

## 2021-01-28 NOTE — ED Notes (Signed)
MD Siadecki and RT at bedside

## 2021-01-28 NOTE — Consult Note (Signed)
PHARMACY -  BRIEF ANTIBIOTIC NOTE   Pharmacy has received consult(s) for cefepime and vancomycin from an ED provider.  The patient's profile has been reviewed for ht/wt/allergies/indication/available labs.    One time order(s) placed for  Cefepime 2 gram Vancomycin 1500 mg   Further antibiotics/pharmacy consults should be ordered by admitting physician if indicated.                       Thank you, Dorothe Pea, PharmD, BCPS Clinical Pharmacist   01/28/2021  4:40 PM

## 2021-01-28 NOTE — H&P (Addendum)
History and Physical    Charles Flores XKG:[id number] DOB: 1927/06/26 DOA: 01/28/2021  PCP: Lavera Guise, MD    Patient coming from:  SNF   Chief Complaint:  SOB   HPI: Charles Flores is a 87 y.o. male seen in ed with complaints of shortness of breath for the past several days.  EMS was called patient was noted to be hypoxic with O2 sats in the 70s on room air.  Patient is not dyspneic and in no apparent distress with no accessory use of muscles of respiration.  She also denies any chest pain palpitations.  Patient does report not feeling well for the past few days as well.  Per nurse note patient's vitals initially were blood pressure of 70 over 40s, O2 of 70%, on arrival patient was on 15 L nonrebreather, currently on BiPAP.  In the emergency room patient was seen by pulmonologist intensivist and per report CODE STATUS has been requested to be DO NOT RESUSCITATE and DO NOT INTUBATE.  Patient did require Levophed vasopressor therapy and is currently on IV fluids, IV antibiotics and heparin drip for presumed pulmonary embolism treatment for hypoxia and respiratory failure.  Chart reviewed and patient's last primary care visit for Medicare wellness was in April 2022 where he went to his follow-up with his nephew and reports feeling well and doing well.  Chest pain at rest was 1 of patient's chief complaint and an echocardiogram was recommended, patient at that time was asymptomatic and therefore declined further investigation.  Patient also seen by oncology in March of this year for his prostate cancer receiving Zytiga and prednisone, and Lupron.  Patient is also being seen by hospice care. Hpi is o/w limited due to pt's age, and although alert he is not able to give details of history due to acuity and being on bipap.  Pt has past medical history of anemia, GERD, heart disease, renal insufficiency, autonomic dysfunction, syncope, hypotension, aortic aneurysm seen on CT chest done on August 25, 2020.  ED  Course:  Vitals:   01/28/21 1830 01/28/21 1900 01/28/21 2102 01/28/21 2233  BP: 138/60 (!) 112/45 113/73 121/77  Pulse: 87 60 74 79  Resp:   20 20  Temp:      TempSrc:      SpO2: 96% (!) 82% 92% 91%  Weight:      Height:      In the emergency room patient is hypothermic, blood pressure has improved to 115/56 currently, O2 sats of 89% on NIPPV BiPAP, patient has received 2 g of cefepime and vancomycin with pharmacy consult, for empiric coverage for possible aspiration pneumonia.  Patient also started on heparin drip for presumed pulmonary embolism that may be contributing to patient's respiratory failure. CMP shows mildly elevated potassium of 5.3, PCO2 of 15 reflecting metabolic acidosis, creatinine of 2.20 with a previous creatinine of 1.51, anion gap of 18, AST of 55, BNP of 1519.2, and a troponin of 531, lactic acid level of 9.1. CBC shows a white count of 9.1, hemoglobin of 9.2 previous 1 of 11.2, MCV of 103, platelet count of 252 with a normal RDW. Respiratory panel negative for influenza and COVID. ABG shows acidosis pH of 7.21, PCO2 of 37, bicarb of 14.8. Chest x-ray shows patchy atelectasis and consolidation in the mid right lung, probable atelectasis and small effusion at the right lung base as well.   Review of Systems:  Review of Systems  Unable to perform ROS: Acuity of condition  Past Medical History:  Diagnosis Date   Anemia 12/24/2014   Arthritis    B12 deficiency anemia 01/01/2015   Cataract    Coronary artery disease    GERD (gastroesophageal reflux disease)    HIV (human immunodeficiency virus infection) ([place]) 01/28/2021   Hyperlipidemia    Hyperlipidemia    Prostate cancer ([place])    Renal insufficiency 04/15/2017   Seizure disorder ([place]) 01/28/2021   Thyroid nodule 03/14/2016    Past Surgical History:  Procedure Laterality Date   PROSTATE BIOPSY       reports that he has quit smoking. His smoking use included cigarettes. He has a 7.50 pack-year smoking  history. He has never used smokeless tobacco. He reports that he does not drink alcohol and does not use drugs.  No Known Allergies  Family History  Problem Relation Age of Onset   Cancer Mother    Breast cancer Mother    Kidney disease Father    Diabetes Sister    Stroke Sister    Gastric cancer Sister     Prior to Admission medications   Medication Sig Start Date End Date Taking? Authorizing Provider  abiraterone acetate (ZYTIGA) 250 MG tablet TAKE 3 TABLETS BY MOUTH DAILY. TAKE ON AN EMPTY STOMACH 1 HOUR BEFORE OR 2 HOURS AFTER A MEAL 11/17/20   Earlie Server, MD  carbamide peroxide (DEBROX) 6.5 % OTIC solution Place 5 drops into the left ear 2 (two) times daily. 11/05/20 11/05/21  Cuthriell, Charline Bills, PA-C  meclizine (ANTIVERT) 12.5 MG tablet Take 1 tablet (12.5 mg total) by mouth 3 (three) times daily as needed for dizziness. 11/05/20   Cuthriell, Charline Bills, PA-C  midodrine (PROAMATINE) 2.5 MG tablet Take 1 tablet (2.5 mg total) by mouth 3 (three) times daily with meals. 11/09/20   Lavera Guise, MD  omeprazole (PRILOSEC) 20 MG capsule TAKE 1 CAPSULE BY MOUTH EVERY DAY Patient taking differently: Take 20 mg by mouth daily. TAKE 1 CAPSULE BY MOUTH EVERY DAY 08/30/19   Earlie Server, MD  predniSONE (DELTASONE) 5 MG tablet TAKE 1 TABLET BY MOUTH 2 TIMES DAILY WITH A MEAL 12/21/20 12/21/21  Earlie Server, MD    Physical Exam: Vitals:   01/28/21 1830 01/28/21 1900 01/28/21 2102 01/28/21 2233  BP: 138/60 (!) 112/45 113/73 121/77  Pulse: 87 60 74 79  Resp:   20 20  Temp:      TempSrc:      SpO2: 96% (!) 82% 92% 91%  Weight:      Height:       Physical Exam Vitals and nursing note reviewed.  Constitutional:      Appearance: He is ill-appearing.  HENT:     Head: Normocephalic and atraumatic.     Right Ear: External ear normal.     Left Ear: External ear normal.     Nose: Nose normal.     Mouth/Throat:     Mouth: Mucous membranes are moist.  Eyes:     Extraocular Movements: Extraocular movements  intact.     Pupils: Pupils are equal, round, and reactive to light.  Cardiovascular:     Rate and Rhythm: Normal rate and regular rhythm.     Pulses: Normal pulses.     Heart sounds: Normal heart sounds.  Pulmonary:     Effort: Pulmonary effort is normal.     Breath sounds: Normal breath sounds.  Abdominal:     General: Bowel sounds are normal. There is no distension.  Palpations: Abdomen is soft.     Tenderness: There is no abdominal tenderness. There is no guarding.  Musculoskeletal:     Right lower leg: Edema present.     Left lower leg: Edema present.  Neurological:     General: No focal deficit present.     Mental Status: He is alert and oriented to person, place, and time.     Cranial Nerves: Cranial nerves are intact.  Psychiatric:        Attention and Perception: Attention normal.        Mood and Affect: Mood normal.        Speech: Speech normal.        Behavior: Behavior normal. Behavior is cooperative.     Labs on Admission: I have personally reviewed following labs and imaging studies  No results for input(s): CKTOTAL, CKMB, TROPONINI in the last 72 hours. Lab Results  Component Value Date   WBC 9.1 01/28/2021   HGB 9.2 (L) 01/28/2021   HCT 27.5 (L) 01/28/2021   MCV 103.0 (H) 01/28/2021   PLT 252 01/28/2021    Recent Labs  Lab 01/28/21 1540  NA 136  K 5.3*  CL 103  CO2 15*  BUN 47*  CREATININE 2.20*  CALCIUM 8.8*  PROT 6.4*  BILITOT 0.8  ALKPHOS 88  ALT 30  AST 55*  GLUCOSE 111*   COVID-19 Labs No results for input(s): DDIMER, FERRITIN, LDH, CRP in the last 72 hours. Lab Results  Component Value Date   [place] NEGATIVE 01/28/2021    Radiological Exams on Admission: DG Chest Portable 1 View  Result Date: 01/28/2021 CLINICAL DATA:  Hypoxia EXAM: PORTABLE CHEST 1 VIEW COMPARISON:  11/27/2020 FINDINGS: Patient is rotated. New likely pleuroparenchymal density at the right lung base. New patchy density is present in the peripheral mid right  lung. No pneumothorax. Similar cardiomediastinal contours. IMPRESSION: Patchy atelectasis/consolidation in the peripheral mid right lung. Probable atelectasis and small effusion at the right lung base. Electronically Signed   By: Macy Mis M.D.   On: 01/28/2021 16:10    EKG: Independently reviewed.  SR 88, PVC poor tracing .   Assessment/Plan Principal Problem:   Acute respiratory failure with hypoxia (HCC) Active Problems:   Hypovolemic shock (HCC)   Anemia   Renal insufficiency   Autonomic dysfunction   Elevated troponin   Respiratory failure: Pt currently on bipap. Blood pressure 121/77, pulse 79, temperature (!) 94.9 F (34.9 C), temperature source Rectal, resp. rate 20, height 5\' 11"  (1.803 m), weight 74 kg, SpO2 91 %. Supportive care and Mdi as needed. We will continue with Heparin drip and iv abx for treatment plan. Once stable NM v/q scan, and 2 d echo.  Intensivist on board.   Hypovolemic shock: Chart review shows pt has low BP and has been on midodrine.  Pt has received IVF and levophed transiently and bp has been stable since then.  Levophed d/c earlier.  Will start pt on hydrocortisone 50 mg iv q 6h.  Anemia: Hb is stable nad is less than before but above 8. Type and screen.  Iv ppi.  AKI on CKD: Avoid contrast studies. Correct hypovolemia. Renally dose meds.  Abnormal troponin: Suspect NSTEMI , we will continue pt on heparin drip and cycle troponin. Currently not stable for any cardiac intervention and conservative medical management is appropriate.BB/ ACEi/ Morphine not started due to low bp and NTG held due to hypotension as well. We will obtain 2 d echo.Cardiology consult per AM  MD.   DVT prophylaxis:  Heparin Drip.  Code Status:  DNR/DNI.   Family Communication:  Liz Malady (Niece)  [phone] [facility] Phone)   Disposition Plan:  SNF   Consults called:  Intensivist- Dr.Gonzalez.  Admission status: Inpatient.     Para Skeans MD [facility] [phone] How to contact the [facility] Attending or Consulting provider [place] or covering provider during after hours [place], for this patient.    Check the care team in [facility] and look for a) attending/consulting [facility] provider listed and b) the [facility] team listed Log into [email or web address] and use [place]'s universal password to access. If you do not have the password, please contact the hospital operator. Locate the [facility] Ed provider you are looking for under [facility] and page to a number that you can be directly reached. If you still have difficulty reaching the provider, please page the [facility] (Director on Call) for the Hospitalists listed on amion for assistance. [email or web address] Password [facility] 01/28/2021, 11:30 PM

## 2021-01-28 NOTE — ED Notes (Signed)
Levophed stopped at this time per MD Cherylann Banas and MD Patsey Berthold verbal order at this time. Per MD Cherylann Banas and Patsey Berthold, keep fluids, heparin, and antibiotics running at this time.

## 2021-01-28 NOTE — Consult Note (Signed)
Pharmacy Antibiotic Note  Charles Flores is a 87 y.o. male admitted on 01/28/2021 with shortness of breath/hypoxia. Patient with PMH of CKD and prostate cancer. Pharmacy has been consulted for vancomycin and cefepime dosing for sepsis.   Patient appears to have AKI on CKD (Scr 2.20)  --baseline Scr appears to be  ~ 1.4-1.5  Plan: Cefepime 2 gram Q12H based on current renal function Vancomycin 1500 mg X 1 LD ordered Will hold off on scheduled regimen for now and dose by levels Will order Random level for 6/12 AM unless renal function improves and need random level sooner Based on current renal function, expect Vancomycin regimen be to ~ Q48 hours Follow up cultures Monitor renal function   Height: 5\' 11"  (180.3 cm) Weight: 74 kg (163 lb 2.3 oz) IBW/kg (Calculated) : 75.3  Temp (24hrs), Avg:94.9 F (34.9 C), Min:94.9 F (34.9 C), Max:94.9 F (34.9 C)  Recent Labs  Lab 01/28/21 1540  WBC 9.1  CREATININE 2.20*  LATICACIDVEN 9.1*    Estimated Creatinine Clearance: 22.4 mL/min (A) (by C-G formula based on SCr of 2.2 mg/dL (H)).    No Known Allergies  Antimicrobials this admission: 6/10 cefepime >>  6/10 vancomycin >>   Dose adjustments this admission: N/a  Microbiology results: 6/10 BCx: sent 6/10 UCx: sent  6/10 MRSA PCR: ordered   Thank you for allowing pharmacy to be a part of this patient's care.  Dorothe Pea, PharmD, BCPS Clinical Pharmacist   01/28/2021 7:36 PM

## 2021-01-28 NOTE — ED Provider Notes (Signed)
[facility] Emergency Department Provider Note ____________________________________________   Event Date/Time   First MD Initiated Contact with Patient 01/28/21 1540     (approximate)  I have reviewed the triage vital signs and the nursing notes.   HISTORY  Chief Complaint Shortness of Breath    HPI Charles Flores is a 87 y.o. male with PMH as noted below including chronic kidney disease, anemia, prostate cancer who presents from his nursing facility with shortness of breath.  The patient states that he has been feeling short of breath for the last several days.  He was noted to be hypoxic to the 70s by EMS.  He denies any chest pain or other acute pain.  He states he generally feels unwell.  Past Medical History:  Diagnosis Date   Anemia 12/24/2014   Arthritis    B12 deficiency anemia 01/01/2015   Cataract    Coronary artery disease    GERD (gastroesophageal reflux disease)    HIV (human immunodeficiency virus infection) ([place]) 01/28/2021   Hyperlipidemia    Hyperlipidemia    Prostate cancer ([place])    Renal insufficiency 04/15/2017   Seizure disorder ([place]) 01/28/2021   Thyroid nodule 03/14/2016    Patient Active Problem List   Diagnosis Date Noted   HIV (human immunodeficiency virus infection) ([place]) 01/28/2021   Seizure disorder ([place]) 01/28/2021   Hypovolemic shock ([place]) 01/28/2021   Acute respiratory failure with hypoxia ([place]) 01/28/2021   Adult failure to thrive 01/28/2021   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   Elevated troponin 01/28/2021   Autonomic dysfunction 01/10/2021   B12 deficiency 07/28/2020   Anemia due to stage 3a chronic kidney disease ([place]) 07/28/2020   Idiopathic hypotension 01/18/2018   Near syncope 01/18/2018   SOB (shortness of breath) 01/18/2018   Pulmonary nodule 12/06/2017   Encounter for antineoplastic chemotherapy 10/01/2017   Renal insufficiency 04/15/2017   Thyroid nodule 03/14/2016   Weight loss 02/24/2016    B12 deficiency anemia 01/01/2015   Prostate cancer ([place]) 12/24/2014   Anemia 12/24/2014    Past Surgical History:  Procedure Laterality Date   PROSTATE BIOPSY      Prior to Admission medications   Medication Sig Start Date End Date Taking? Authorizing Provider  abiraterone acetate (ZYTIGA) 250 MG tablet TAKE 3 TABLETS BY MOUTH DAILY. TAKE ON AN EMPTY STOMACH 1 HOUR BEFORE OR 2 HOURS AFTER A MEAL 11/17/20  Yes Earlie Server, MD  midodrine (PROAMATINE) 2.5 MG tablet Take 1 tablet (2.5 mg total) by mouth 3 (three) times daily with meals. 11/09/20  Yes Lavera Guise, MD  omeprazole (PRILOSEC) 20 MG capsule TAKE 1 CAPSULE BY MOUTH EVERY DAY Patient taking differently: Take 20 mg by mouth daily. TAKE 1 CAPSULE BY MOUTH EVERY DAY 08/30/19  Yes Earlie Server, MD  predniSONE (DELTASONE) 5 MG tablet TAKE 1 TABLET BY MOUTH 2 TIMES DAILY WITH A MEAL 12/21/20 12/21/21 Yes Earlie Server, MD  carbamide peroxide (DEBROX) 6.5 % OTIC solution Place 5 drops into the left ear 2 (two) times daily. 11/05/20 11/05/21  Cuthriell, Charline Bills, PA-C  meclizine (ANTIVERT) 12.5 MG tablet Take 1 tablet (12.5 mg total) by mouth 3 (three) times daily as needed for dizziness. 11/05/20   Cuthriell, Charline Bills, PA-C    Allergies Patient has no known allergies.  Family History  Problem Relation Age of Onset   Cancer Mother    Breast cancer Mother    Kidney disease Father    Diabetes Sister    Stroke Sister  Gastric cancer Sister     Social History Social History   Tobacco Use   Smoking status: Former    Packs/day: 0.50    Years: 15.00    Pack years: 7.50    Types: Cigarettes   Smokeless tobacco: Never   Tobacco comments:    quit 40 years  Vaping Use   Vaping Use: Never used  Substance Use Topics   Alcohol use: No    Alcohol/week: 0.0 standard drinks   Drug use: No    Review of Systems  Constitutional: No fever. Eyes: No redness. ENT: No neck pain. Cardiovascular: Denies chest pain. Respiratory: Positive for  shortness of breath. Gastrointestinal: No vomiting or diarrhea.  Genitourinary: Negative for flank pain. Musculoskeletal: Negative for back pain. Skin: Negative for rash. Neurological: Negative for headaches, focal weakness or numbness.   ____________________________________________   PHYSICAL EXAM:  VITAL SIGNS: ED Triage Vitals  Enc Vitals Group     BP 01/28/21 1546 119/69     Pulse Rate 01/28/21 1537 82     Resp 01/28/21 1546 19     Temp --      Temp src --      SpO2 01/28/21 1546 96 %     Weight 01/28/21 1533 163 lb 2.3 oz (74 kg)     Height 01/28/21 1533 5\' 11"  (1.803 m)     Head Circumference --      Peak Flow --      Pain Score --      Pain Loc --      Pain Edu? --      Excl. in [place]? --     Constitutional: Alert and oriented.  Frail appearing but in no acute distress. Eyes: Conjunctivae are normal.  EOMI.  PERRLA. Head: Atraumatic. Nose: No congestion/rhinnorhea. Mouth/Throat: Mucous membranes are slightly dry. Neck: Normal range of motion.  Cardiovascular: Normal rate, irregular rhythm. Grossly normal heart sounds.  Good peripheral circulation. Respiratory: Increased espiratory effort.  No retractions. Lungs CTAB. Gastrointestinal: Soft and nontender. No distention.  Genitourinary: No flank tenderness. Musculoskeletal: 1+ bilateral lower extremity edema.  Extremities warm and well perfused.  Neurologic:  Normal speech and language.  Motor intact in all extremities.  No gross focal neurologic deficits are appreciated.  Skin:  Skin is cool and dry. No rash noted. Psychiatric: Mood and affect are normal. Speech and behavior are normal.  ____________________________________________   LABS (all labs ordered are listed, but only abnormal results are displayed)  Labs Reviewed  COMPREHENSIVE METABOLIC PANEL - Abnormal; Notable for the following components:      Result Value   Potassium 5.3 (*)    CO2 15 (*)    Glucose, Bld 111 (*)    BUN 47 (*)    Creatinine,  Ser 2.20 (*)    Calcium 8.8 (*)    Total Protein 6.4 (*)    Albumin 3.2 (*)    AST 55 (*)    GFR, Estimated 27 (*)    Anion gap 18 (*)    All other components within normal limits  CBC WITH DIFFERENTIAL/PLATELET - Abnormal; Notable for the following components:   RBC 2.67 (*)    Hemoglobin 9.2 (*)    HCT 27.5 (*)    MCV 103.0 (*)    MCH 34.5 (*)    Lymphs Abs 0.5 (*)    Monocytes Absolute 1.1 (*)    All other components within normal limits  BRAIN NATRIURETIC PEPTIDE - Abnormal; Notable for the following components:  B Natriuretic Peptide 1,519.2 (*)    All other components within normal limits  LACTIC ACID, PLASMA - Abnormal; Notable for the following components:   Lactic Acid, Venous 9.1 (*)    All other components within normal limits  LACTIC ACID, PLASMA - Abnormal; Notable for the following components:   Lactic Acid, Venous 3.8 (*)    All other components within normal limits  BLOOD GAS, VENOUS - Abnormal; Notable for the following components:   pH, Ven 7.21 (*)    pCO2, Ven 37 (*)    Bicarbonate 14.8 (*)    Acid-base deficit 12.2 (*)    All other components within normal limits  BLOOD GAS, ARTERIAL - Abnormal; Notable for the following components:   pH, Arterial 7.26 (*)    pO2, Arterial 52 (*)    Bicarbonate 15.3 (*)    Acid-base deficit 10.8 (*)    All other components within normal limits  TSH - Abnormal; Notable for the following components:   TSH 12.071 (*)    All other components within normal limits  LACTIC ACID, PLASMA - Abnormal; Notable for the following components:   Lactic Acid, Venous 2.8 (*)    All other components within normal limits  TROPONIN I (HIGH SENSITIVITY) - Abnormal; Notable for the following components:   Troponin I (High Sensitivity) 531 (*)    All other components within normal limits  TROPONIN I (HIGH SENSITIVITY) - Abnormal; Notable for the following components:   Troponin I (High Sensitivity) 669 (*)    All other components within  normal limits  TROPONIN I (HIGH SENSITIVITY) - Abnormal; Notable for the following components:   Troponin I (High Sensitivity) 637 (*)    All other components within normal limits  RESP PANEL BY RT-PCR (FLU A&B, COVID) ARPGX2  MRSA PCR SCREENING  CULTURE, BLOOD (ROUTINE X 2)  CULTURE, BLOOD (ROUTINE X 2)  URINE CULTURE  PROTIME-INR  URINALYSIS, COMPLETE (UACMP) WITH MICROSCOPIC  HEPARIN LEVEL (UNFRACTIONATED)  CBC  BASIC METABOLIC PANEL  HIV ANTIBODY (ROUTINE TESTING W REFLEX)  CORTISOL  RPR  LACTIC ACID, PLASMA  TYPE AND SCREEN  TYPE AND SCREEN   ____________________________________________  EKG  ED ECG REPORT I, Arta Silence, the attending physician, personally viewed and interpreted this ECG.  Date: 01/28/2021 EKG Time: 1604 Rate: 88 Rhythm: normal sinus rhythm QRS Axis: normal Intervals: normal ST/T Wave abnormalities: Nonspecific ST abnormalities; poor quality EKG Baseline Narrative Interpretation: Nonspecific abnormalities with no evidence of acute ischemia  ____________________________________________  RADIOLOGY  Chest x-ray interpreted by me shows R lung atelectasis vs consolidation  ____________________________________________   PROCEDURES  Procedure(s) performed: No  Procedures  Critical Care performed: Yes  CRITICAL CARE Performed by: Arta Silence   Total critical care time: 60 minutes  Critical care time was exclusive of separately billable procedures and treating other patients.  Critical care was necessary to treat or prevent imminent or life-threatening deterioration.  Critical care was time spent personally by me on the following activities: development of treatment plan with patient and/or surrogate as well as nursing, discussions with consultants, evaluation of patient's response to treatment, examination of patient, obtaining history from patient or surrogate, ordering and performing treatments and interventions, ordering  and review of laboratory studies, ordering and review of radiographic studies, pulse oximetry and re-evaluation of patient's condition.  ____________________________________________   INITIAL IMPRESSION / ASSESSMENT AND PLAN / ED COURSE  Pertinent labs & imaging results that were available during my care of the patient were reviewed by me  and considered in my medical decision making (see chart for details).   87 year old male with PMH of chronic kidney disease, anemia, and prostate cancer presents from his nursing facility with shortness of breath and hypoxia noted to the 70s by EMS.  This did not respond significantly to O2 by nonrebreather.  I reviewed the past medical records in [place].  The patient was most recently seen in the ED for chest pain 2 months ago with a negative work-up at that time.  He is on androgen deprivation therapy with Zytiga for his prostate cancer.  On exam currently, the patient is somewhat frail and weak appearing but in no acute distress.  He is alert and oriented.  He was hypotensive in the field but his vital signs are now normal, except for O2 saturation in the 80s on 15 L by nonrebreather.  He does have somewhat increased work of breathing but the lungs are clear to auscultation.  There is minimal peripheral edema.  Neurologic exam is nonfocal.  Differential is broad but includes COVID-19, pneumonia, acute bronchitis, pulmonary edema/new onset CHF, other cardiac cause, pulmonary embolism.  We will obtain chest x-ray, lab work-up, start the patient on BiPAP, and reassess.  ----------------------------------------- 5:28 PM on 01/28/2021 -----------------------------------------  Initial work-up is as follows: Chest x-ray reveals patchy right lung atelectasis versus consolidation.  Lactate is 9.1.  Troponin is 531.  WBC count is normal.  I ordered broad-spectrum antibiotics as well as the full 72mL/kg fluid bolus to treat for presumed sepsis related to  pneumonia.  Due to the significant hypoxia, I am also concerned for PE.  However, the patient's GFR is 27.  I discussed this with Dr. Tery Sanfilippo from radiology and requested a CT angio with contrast, however Dr. Tery Sanfilippo advises that we cannot perform a contrast CT with a GFR below 30 and recommends a VQ scan.   Because of the elevated troponin I had ordered heparin per the ACS dosing, however since the patient is critically ill and may not immediately be able to go to VQ scan I have changed the dosing to the higher VTE treatment dose.  The O2 saturation has been unreliable with a generally poor waveform despite changing it to several sites.  On the finger, the O2 saturation has a good waveform and shows an O2 in the mid to high 70s although the patient remains alert and oriented.  We obtained an ABG and it confirms an O2 saturation of 74 and a PO2 of 52.  I had an extensive discussion with the patient's nephew.  He requested that if the patient requires admission, that the family would request transfer to [facility].  We called the [facility] transfer center, however there are no ICU beds available at this time.  I confirmed that the patient is DNR/DNI with the nephew and patient, and I see that this was also noted and verbally confirmed with the patient on his admission to [facility] last month.  Given that he is still hypoxic despite being on BiPAP with maximal O2 concentration, there is no way to improve his oxygenation.  I consulted Dr. Patsey Berthold from the ICU here.  She advises that there are also no immediately available ICU beds, however she will review the patient's case and consult on him.  The patient's blood pressure has also decreased despite fluids.  He is almost finished with his 2250 mL bolus I have ordered peripheral Levophed.  ----------------------------------------- 6:37 PM on 01/28/2021 -----------------------------------------  Dr. Patsey Berthold from the ICU team  came to evaluate the patient.  She discussed  the plan of care and preferences with the patient and his family members.  She confirmed that the patient is DNR/DNI.  The family wants to continue antibiotics, fluids, and heparin, but no more aggressive measures, so we will discontinue the Levophed.  Dr. Patsey Berthold recommends admission to the hospitalist service.  I then consulted Dr. Posey Pronto from the hospitalist service for admission.  ____________________________________________   FINAL CLINICAL IMPRESSION(S) / ED DIAGNOSES  Final diagnoses:  Acute respiratory failure with hypoxia ([place])      NEW MEDICATIONS STARTED DURING THIS VISIT:  New Prescriptions   No medications on file     Note:  This document was prepared using Dragon voice recognition software and may include unintentional dictation errors.    Arta Silence, MD 02/13/2021 [phone]

## 2021-01-28 NOTE — Consult Note (Signed)
Reason for Consult:Acute respiratory failure Referring Physician: Dr. Arta Silence  Charles Flores is an 87 y.o. male.  HPI:  The patient is a 87 year old male with a history of castration resistant prostate cancer and other issues as below who presented today with increasing shortness of breath.  Patient cannot give an accurate history but stated that he always feels unwell.  It appears that he has been in state of decline over the last few years worsening over the last several months.  Review of his records also show that he has chronic issues with dyspnea.  He has a history of castration resistant prostate cancer and has been on Zytiga.  He was followed at the cancer center at [facility] but most recently had transfer his care to [facility].  He was admitted to [facility] in May for weakness.  He was noted to have malnutrition and adult failure to thrive.  Prior to that he had had multiple visits to the emergency room for dizziness, chest pain and shortness of breath.  After his admission to [facility] he was discharged to an SNF for rehab.  He has been followed by oncology palliative care.  He had DNR/DNI status established in 2021 with a MOST form filled.  Presentation he was noted to be hypoxic.  He is currently on BiPAP with persistent hypoxia.  He is a very frail individual.  He was started empirically on heparin due to acute on chronic renal failure noted and inability to get CT angio, started for potential PE.  The patient was noted to be hypothermic, hypotensive and noted to have lethargy.  Possibility of sepsis likely as well given his patchy infiltrates on x-ray.  Patient does not offer any more history.  I have discussed with the patient's nephew, Lanny Hurst, who is very involved in his care.  The patient does not have children and Lanny Hurst has acted as a surrogate.  Lanny Hurst reiterates that the patient was of sound mind when he made his decision of DNI DNR and that he would not want heroic measures to prolong life.  Lanny Hurst is  agreeable to IV fluids, antibiotics and empiric anticoagulation after concerns of possible PE and sepsis were presented to him.  Otherwise however ,he defers to his uncles' decision and is at peace with this decision.   Past Medical History:  Diagnosis Date   Anemia 12/24/2014   Arthritis    B12 deficiency anemia 01/01/2015   Cataract    Coronary artery disease    GERD (gastroesophageal reflux disease)    Hyperlipidemia    Hyperlipidemia    Prostate cancer ([place])    Renal insufficiency 04/15/2017   Seizure disorder ([place]) 01/28/2021   Thyroid nodule 03/14/2016    Patient Active Problem List   Diagnosis Date Noted   Seizure disorder ([place]) 01/28/2021   Hypovolemic shock ([place]) 01/28/2021   Acute respiratory failure with hypoxia ([place]) 01/28/2021   Adult failure to thrive 01/28/2021   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   Autonomic dysfunction 01/10/2021   B12 deficiency 07/28/2020   Anemia due to stage 3a chronic kidney disease ([place]) 07/28/2020   Idiopathic hypotension 01/18/2018   Near syncope 01/18/2018   SOB (shortness of breath) 01/18/2018   Pulmonary nodule 12/06/2017   Encounter for antineoplastic chemotherapy 10/01/2017   Renal insufficiency 04/15/2017   Thyroid nodule 03/14/2016   Weight loss 02/24/2016   B12 deficiency anemia 01/01/2015   Prostate cancer ([place]) 12/24/2014   Anemia 12/24/2014    Past Surgical History:  Procedure Laterality Date  PROSTATE BIOPSY      Family History  Problem Relation Age of Onset   Cancer Mother    Breast cancer Mother    Kidney disease Father    Diabetes Sister    Stroke Sister    Gastric cancer Sister     Social History:  Social History   Tobacco Use   Smoking status: Former    Packs/day: 0.50    Years: 15.00    Pack years: 7.50    Types: Cigarettes   Smokeless tobacco: Never   Tobacco comments:    quit 40 years  Substance Use Topics   Alcohol use: No    Alcohol/week: 0.0 standard drinks   Patient is  unmarried.  He has no children.  He has a nephew, Lanny Hurst, who has been very involved in his care he also has a niece who is involved in his care.  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   Allergies: No Known Allergies  Medications:  Prior to Admission:  Current Outpatient Medications  Medication Instructions   abiraterone acetate (ZYTIGA) 250 MG tablet TAKE 3 TABLETS BY MOUTH DAILY. TAKE ON AN EMPTY STOMACH 1 HOUR BEFORE OR 2 HOURS AFTER A MEAL   carbamide peroxide (DEBROX) 6.5 % OTIC solution 5 drops, Left EAR, 2 times daily   meclizine (ANTIVERT) 12.5 mg, Oral, 3 times daily PRN   midodrine (PROAMATINE) 2.5 mg, Oral, 3 times daily with meals   omeprazole (PRILOSEC) 20 MG capsule TAKE 1 CAPSULE BY MOUTH EVERY DAY   predniSONE (DELTASONE) 5 MG tablet TAKE 1 TABLET BY MOUTH 2 TIMES DAILY WITH A MEAL   Inpatient meds: Scheduled Meds:  hydrocortisone sod succinate (SOLU-CORTEF) inj  50 mg Intravenous Q6H   pantoprazole (PROTONIX) IV  40 mg Intravenous Q12H   vancomycin variable dose per unstable renal function (pharmacist dosing)   Does not apply See admin instructions   Continuous Infusions:  sodium chloride     [START ON 01/19/2021] ceFEPime (MAXIPIME) IV     heparin 1,350 Units/hr (01/28/21 1744)   norepinephrine (LEVOPHED) Adult infusion Stopped (01/28/21 1830)   vancomycin 1,500 mg (01/28/21 1832)   PRN Meds:.acetaminophen **OR** acetaminophen   DATA: Results for orders placed or performed during the hospital encounter of 01/28/21 (from the past 48 hour(s))  Comprehensive metabolic panel     Status: Abnormal   Collection Time: 01/28/21  3:40 PM  Result Value Ref Range   Sodium 136 135 - 145 mmol/L   Potassium 5.3 (H) 3.5 - 5.1 mmol/L   Chloride 103 98 - 111 mmol/L   CO2 15 (L) 22 - 32 mmol/L   Glucose, Bld 111 (H) 70 - 99 mg/dL    Comment: Glucose reference range applies only to samples taken after fasting for at least 8 hours.   BUN 47 (H) 8 - 23 mg/dL    Creatinine, Ser 2.20 (H) 0.61 - 1.24 mg/dL   Calcium 8.8 (L) 8.9 - 10.3 mg/dL   Total Protein 6.4 (L) 6.5 - 8.1 g/dL   Albumin 3.2 (L) 3.5 - 5.0 g/dL   AST 55 (H) 15 - 41 U/L   ALT 30 0 - 44 U/L   Alkaline Phosphatase 88 38 - 126 U/L   Total Bilirubin 0.8 0.3 - 1.2 mg/dL   GFR, Estimated 27 (L) >60 mL/min    Comment: (NOTE) Calculated using the CKD-EPI Creatinine Equation (2021)    Anion gap 18 (H) 5 - 15    Comment: Performed at [facility], [address] [place], [place] [address]  CBC with Differential     Status: Abnormal   Collection Time: 01/28/21  3:40 PM  Result Value Ref Range   WBC 9.1 4.0 - 10.5 K/uL   RBC 2.67 (L) 4.22 - 5.81 MIL/uL   Hemoglobin 9.2 (L) 13.0 - 17.0 g/dL   HCT 27.5 (L) 39.0 - 52.0 %   MCV 103.0 (H) 80.0 - 100.0 fL   MCH 34.5 (H) 26.0 - 34.0 pg   MCHC 33.5 30.0 - 36.0 g/dL   RDW 13.8 11.5 - 15.5 %   Platelets 252 150 - 400 K/uL   nRBC 0.0 0.0 - 0.2 %   Neutrophils Relative % 81 %   Neutro Abs 7.5 1.7 - 7.7 K/uL   Lymphocytes Relative 6 %   Lymphs Abs 0.5 (L) 0.7 - 4.0 K/uL   Monocytes Relative 12 %   Monocytes Absolute 1.1 (H) 0.1 - 1.0 K/uL   Eosinophils Relative 0 %   Eosinophils Absolute 0.0 0.0 - 0.5 K/uL   Basophils Relative 0 %   Basophils Absolute 0.0 0.0 - 0.1 K/uL   Immature Granulocytes 1 %   Abs Immature Granulocytes 0.07 0.00 - 0.07 K/uL    Comment: Performed at [facility], [address] [place], [place] [address]  Troponin I (High Sensitivity)     Status: Abnormal   Collection Time: 01/28/21  3:40 PM  Result Value Ref Range   Troponin I (High Sensitivity) 531 (HH) <18 ng/L    Comment: CRITICAL RESULT CALLED TO, READ BACK BY AND VERIFIED WITH ISABELLA LAPIETRA ON 01/28/21 AT 1628 QSD (NOTE) Elevated high sensitivity troponin I (hsTnI) values and significant  changes across serial measurements may suggest ACS but many other  chronic and acute conditions are known to elevate hsTnI results.  Refer to  the "Links" section for chest pain algorithms and additional  guidance. Performed at [facility], [address] [place], [place] [address]   Brain natriuretic peptide     Status: Abnormal   Collection Time: 01/28/21  3:40 PM  Result Value Ref Range   B Natriuretic Peptide 1,519.2 (H) 0.0 - 100.0 pg/mL    Comment: Performed at [facility], [address] [place], [place] [address]  Resp Panel by RT-PCR (Flu A&B, Covid) Nasopharyngeal Swab     Status: None   Collection Time: 01/28/21  3:40 PM   Specimen: Nasopharyngeal Swab; Nasopharyngeal(NP) swabs in vial transport medium  Result Value Ref Range   SARS Coronavirus 2 by RT PCR NEGATIVE NEGATIVE    Comment: (NOTE) SARS-CoV-2 target nucleic acids are NOT DETECTED.  The SARS-CoV-2 RNA is generally detectable in upper respiratory specimens during the acute phase of infection. The lowest concentration of SARS-CoV-2 viral copies this assay can detect is 138 copies/mL. A negative result does not preclude SARS-Cov-2 infection and should not be used as the sole basis for treatment or other patient management decisions. A negative result may occur with  improper specimen collection/handling, submission of specimen other than nasopharyngeal swab, presence of viral mutation(s) within the areas targeted by this assay, and inadequate number of viral copies(<138 copies/mL). A negative result must be combined with clinical observations, patient history, and epidemiological information. The expected result is Negative.  Fact Sheet for Patients:  EntrepreneurPulse.com.au  Fact Sheet for Healthcare Providers:  IncredibleEmployment.be  This test is no t yet approved or cleared by the [place] FDA and  has been authorized for detection  and/or diagnosis of SARS-CoV-2 by FDA under an Emergency Use Authorization (EUA). This EUA will remain  in effect (meaning this test can be used) for  the duration of the COVID-19 declaration under Section 564(b)(1) of the Act, 21 U.S.C.section 360bbb-3(b)(1), unless the authorization is terminated  or revoked sooner.       Influenza A by PCR NEGATIVE NEGATIVE   Influenza B by PCR NEGATIVE NEGATIVE    Comment: (NOTE) The Xpert Xpress SARS-CoV-2/FLU/RSV plus assay is intended as an aid in the diagnosis of influenza from Nasopharyngeal swab specimens and should not be used as a sole basis for treatment. Nasal washings and aspirates are unacceptable for Xpert Xpress SARS-CoV-2/FLU/RSV testing.  Fact Sheet for Patients: EntrepreneurPulse.com.au  Fact Sheet for Healthcare Providers: IncredibleEmployment.be  This test is not yet approved or cleared by the [place] FDA and has been authorized for detection and/or diagnosis of SARS-CoV-2 by FDA under an Emergency Use Authorization (EUA). This EUA will remain in effect (meaning this test can be used) for the duration of the COVID-19 declaration under Section 564(b)(1) of the Act, 21 U.S.C. section 360bbb-3(b)(1), unless the authorization is terminated or revoked.  Performed at [facility], [address] [place], [place] [address]   Lactic acid, plasma     Status: Abnormal   Collection Time: 01/28/21  3:40 PM  Result Value Ref Range   Lactic Acid, Venous 9.1 (HH) 0.5 - 1.9 mmol/L    Comment: CRITICAL RESULT CALLED TO, READ BACK BY AND VERIFIED WITH ISABELLA LAPIETRA ON 01/28/21 AT 1622 QSD Performed at [facility], [address] [place], [place] [address]   Protime-INR     Status: None   Collection Time: 01/28/21  3:40 PM  Result Value Ref Range   Prothrombin Time 14.0 11.4 - 15.2 seconds   INR 1.1 0.8 - 1.2    Comment: (NOTE) INR goal varies based on device and disease states. Performed at [facility], [address] [place], [place] [address]   Blood gas, venous     Status: Abnormal (Preliminary  result)   Collection Time: 01/28/21  3:43 PM  Result Value Ref Range   pH, Ven 7.21 (L) 7.250 - 7.430   pCO2, Ven 37 (L) 44.0 - 60.0 mmHg   pO2, Ven PENDING 32.0 - 45.0 mmHg   Bicarbonate 14.8 (L) 20.0 - 28.0 mmol/L   Acid-base deficit 12.2 (H) 0.0 - 2.0 mmol/L   O2 Saturation 19.0 %   Patient temperature 37.0    Collection site VEIN    Sample type VEIN     Comment: Performed at [facility], [address] [place], [place] [address]  Blood gas, arterial     Status: Abnormal   Collection Time: 01/28/21  4:58 PM  Result Value Ref Range   FIO2 1.00    Delivery systems CONTINUOUS POSITIVE AIRWAY PRESSURE    Expiratory PAP 5.0    pH, Arterial 7.26 (L) 7.350 - 7.450    Comment: CRITICAL RESULT CALLED TO, READ BACK BY AND VERIFIED WITH: NOTIFIED DR. SIADECKI OF THE CRITICAL RESULTS ON 01/28/2021 AT 1710. DW, RRT.    pCO2 arterial 34 32.0 - 48.0 mmHg   pO2, Arterial 52 (L) 83.0 - 108.0 mmHg   Bicarbonate 15.3 (L) 20.0 - 28.0 mmol/L   Acid-base deficit 10.8 (H) 0.0 - 2.0 mmol/L   O2 Saturation 80.3 %   Patient temperature 37.0    Collection site LEFT RADIAL    Sample type ARTERIAL  DRAW    Allens test (pass/fail) PASS PASS    Comment: Performed at [facility], [address] [place], [place] [address]    DG Chest Portable 1 View  Result Date: 01/28/2021 CLINICAL DATA:  Hypoxia EXAM: PORTABLE CHEST 1 VIEW COMPARISON:  11/27/2020 FINDINGS: Patient is rotated. New likely pleuroparenchymal density at the right lung base. New patchy density is present in the peripheral mid right lung. No pneumothorax. Similar cardiomediastinal contours. IMPRESSION: Patchy atelectasis/consolidation in the peripheral mid right lung. Probable atelectasis and small effusion at the right lung base. Electronically Signed   By: Macy Mis M.D.   On: 01/28/2021 16:10    Review of Systems Could not be obtained due to acuity of situation and patient requiring BiPAP  VS: Blood pressure  138/60, pulse 87, temperature (!) 94.9 F (34.9 C), temperature source Rectal, resp. rate 19, height 5\' 11"  (1.803 m), weight 74 kg, SpO2 96 %. Physical Exam GENERAL: Frail-appearing gentleman, thin, temporal wasting, on BiPAP.  Does respond to name.  States he is feeling "alright". HEAD: Normocephalic, atraumatic.  Bitemporal wasting. EYES: Pupils equal, round, reactive to light.  No scleral icterus.  MOUTH: Unable to assess well patient on BiPAP. NECK: Supple. No thyromegaly. Trachea midline. No JVD.  No adenopathy. PULMONARY: Good air entry bilaterally.  Coarse breath sounds otherwise, no adventitious sounds. CARDIOVASCULAR: S1 and S2. Regular rate and rhythm.  Difficult to assess over BiPAP noise. ABDOMEN: Scaphoid, nontender, nondistended. MUSCULOSKELETAL: No joint deformity, no clubbing, lower extremity edema  NEUROLOGIC: No overt focality.  The patient to name. SKIN: Intact,warm,dry.  Chronic stasis changes. PSYCH: Does respond to voice.  Assessment/Plan:  Acute respiratory failure with hypoxia Possible pneumonia with sepsis (HCAP) Possible PE Patient high risk for PE due to underlying malignancy Continue empiric antibiotics Check procalcitonin Continue empiric IV heparin Oxygen supplementation to maintain O2 sats between 88 to 92%  Shock possible septic/hypovolemic Cannot rule out cardiogenic shock (elevated troponin/BNP) History of autonomic dysfunction and postural hypotension Trend troponins IV fluids Stress dose steroids Resume midodrine when able to take p.o. well No escalation to pressors per patient's prior wishes Address possible sepsis  Hypothermia Differential sepsis, hypothyroidism, adrenal insufficiency, cardiogenic shock Passive warming Volume resuscitate Stress dose steroids Check thyroid function  Acute on chronic renal failure Continue IV fluids Monitor renal panel Avoid nephrotoxins when able Not a candidate for dialysis  Adult failure to  thrive History of castration resistant prostate cancer Supportive care  CODE STATUS: DNR/DNI.  After discussion with the patient's nephew Lanny Hurst, it is agreed that further escalation of care aside from conservative measures will not be done.  Lanny Hurst is more interested in his uncle'  comfort.  Given the patient's advanced age, frailty and ongoing issues with adult failure to thrive this is a reasonable decision.  Recommend palliative/hospice care consultation.  The patient has previously been seen by Billey Chang NP from [facility] with regards to palliative care needs.   Thank you for allowing me to participate in this patient's care.  PCCM will sign off please reconsult as needed.  Renold Don, MD [facility] 01/28/2021, 7:40 PM   *This note was dictated using voice recognition software/Dragon.  Despite best efforts to proofread, errors can occur which can change the meaning.  Any change was purely unintentional.

## 2021-01-28 NOTE — ED Notes (Signed)
Bipap verbal order from MD Siadecki at this time

## 2021-01-28 NOTE — ED Triage Notes (Signed)
PT arrived via [facility] from [facility] with c/o SOB. Per EMS, pt is A&Ox4 with them and states he doesn't feel good and has been SOB x a few days.   Per EMS, facility staff states they found him like this today.    Per EMS, BP 70/40, O2 70s, CO2 9 - 11  Pt currently on 15L via NRB

## 2021-01-28 NOTE — ED Notes (Signed)
Family leaving at this time.

## 2021-01-28 NOTE — ED Notes (Signed)
Pt cleaned up by this RN, Patent examiner, and Hotel manager after large BM. Clean chux, brief provided at this time. New primofit placed at this time. Pt tolerated well.

## 2021-01-28 NOTE — ED Notes (Signed)
Pt woke up and attempted to take bipap off, pt reoriented and was told in hospital, pt back to baseline and no longer confused

## 2021-01-28 NOTE — ED Notes (Signed)
[facility] hospital for transfer per Jackson Latino are at capacity [phone]

## 2021-01-28 NOTE — Consult Note (Addendum)
ANTICOAGULATION CONSULT NOTE - Initial Consult  Pharmacy Consult for Heparin infusion Indication: PE  No Known Allergies  Patient Measurements: Height: 5\' 11"  (180.3 cm) Weight: 74 kg (163 lb 2.3 oz) IBW/kg (Calculated) : 75.3 Heparin Dosing Weight: 74 kg   Vital Signs: BP: 119/69 (06/10 1546) Pulse Rate: 82 (06/10 1537)  Labs: Recent Labs    01/28/21 1540  HGB 9.2*  HCT 27.5*  PLT 252  LABPROT 14.0  INR 1.1  CREATININE 2.20*  TROPONINIHS 531*    Estimated Creatinine Clearance: 22.4 mL/min (A) (by C-G formula based on SCr of 2.2 mg/dL (H)).   Medical History: Past Medical History:  Diagnosis Date   Anemia 12/24/2014   Arthritis    B12 deficiency anemia 01/01/2015   Cataract    Coronary artery disease    GERD (gastroesophageal reflux disease)    Hyperlipidemia    Hyperlipidemia    Prostate cancer ([place])    Renal insufficiency 04/15/2017   Thyroid nodule 03/14/2016    Medications:  No prior anticoagulation noted  Assessment: 87 y.o. male presented with SOB. Troponin 531. No prior anticoagulation noted. Pharmacy has been consulted for initiation and management of heparin infusion for concern for Pulmonary Embolism  Goal of Therapy:  Heparin level 0.3-0.7 units/ml Monitor platelets by anticoagulation protocol: Yes   Plan:  Give 5000 units bolus x 1 Start heparin infusion at 1350 units/hr Check anti-Xa level in 8 hours and daily while on heparin Continue to monitor H&H and platelets  Dorothe Pea, PharmD, BCPS Clinical Pharmacist   01/28/2021,4:46 PM

## 2021-01-29 ENCOUNTER — Inpatient Hospital Stay (HOSPITAL_COMMUNITY)
Admit: 2021-01-29 | Discharge: 2021-01-29 | Disposition: A | Discharge status: Home / Self Care | Payer: Medicare Other | Attending: Internal Medicine | Admitting: Internal Medicine

## 2021-01-29 DIAGNOSIS — I361 Nonrheumatic tricuspid (valve) insufficiency: Secondary | ICD-10-CM

## 2021-01-29 DIAGNOSIS — R079 Chest pain, unspecified: Secondary | ICD-10-CM

## 2021-01-29 DIAGNOSIS — J9601 Acute respiratory failure with hypoxia: Secondary | ICD-10-CM

## 2021-01-29 LAB — CBC
HCT: 26.3 % — ABNORMAL LOW (ref 39.0–52.0)
Hemoglobin: 8.9 g/dL — ABNORMAL LOW (ref 13.0–17.0)
MCH: 34.5 pg — ABNORMAL HIGH (ref 26.0–34.0)
MCHC: 33.8 g/dL (ref 30.0–36.0)
MCV: 101.9 fL — ABNORMAL HIGH (ref 80.0–100.0)
Platelets: 255 10*3/uL (ref 150–400)
RBC: 2.58 MIL/uL — ABNORMAL LOW (ref 4.22–5.81)
RDW: 13.5 % (ref 11.5–15.5)
WBC: 13.6 10*3/uL — ABNORMAL HIGH (ref 4.0–10.5)
nRBC: 0 % (ref 0.0–0.2)

## 2021-01-29 LAB — TROPONIN I (HIGH SENSITIVITY)
Troponin I (High Sensitivity): 637 ng/L (ref <18)
Troponin I (High Sensitivity): 771 ng/L (ref <18)
Troponin I (High Sensitivity): 748 ng/L (ref <18)

## 2021-01-29 LAB — URINALYSIS, COMPLETE (UACMP) WITH MICROSCOPIC
Bacteria, UA: NONE SEEN
Bilirubin Urine: NEGATIVE
Glucose, UA: NEGATIVE mg/dL
Ketones, ur: NEGATIVE mg/dL
Leukocytes,Ua: NEGATIVE
Nitrite: NEGATIVE
Protein, ur: 30 mg/dL — AB
Specific Gravity, Urine: 1.02 (ref 1.005–1.030)
pH: 5 (ref 5.0–8.0)

## 2021-01-29 LAB — ECHOCARDIOGRAM COMPLETE
Height: 71 in
S' Lateral: 1.95 cm
Weight: 2610.25 oz

## 2021-01-29 LAB — BASIC METABOLIC PANEL
Anion gap: 9 (ref 5–15)
BUN: 52 mg/dL — ABNORMAL HIGH (ref 8–23)
CO2: 20 mmol/L — ABNORMAL LOW (ref 22–32)
Calcium: 8.6 mg/dL — ABNORMAL LOW (ref 8.9–10.3)
Chloride: 110 mmol/L (ref 98–111)
Creatinine, Ser: 2 mg/dL — ABNORMAL HIGH (ref 0.61–1.24)
GFR, Estimated: 31 mL/min — ABNORMAL LOW (ref >60)
Glucose, Bld: 107 mg/dL — ABNORMAL HIGH (ref 70–99)
Potassium: 5.2 mmol/L — ABNORMAL HIGH (ref 3.5–5.1)
Sodium: 139 mmol/L (ref 135–145)

## 2021-01-29 LAB — BLOOD GAS, VENOUS
Acid-base deficit: 12.2 mmol/L — ABNORMAL HIGH (ref 0.0–2.0)
Bicarbonate: 14.8 mmol/L — ABNORMAL LOW (ref 20.0–28.0)
O2 Saturation: 19 %
Patient temperature: 37
pCO2, Ven: 37 mmHg — ABNORMAL LOW (ref 44.0–60.0)
pH, Ven: 7.21 — ABNORMAL LOW (ref 7.250–7.430)
pO2, Ven: <31 mmHg — CL (ref 32.0–45.0)

## 2021-01-29 LAB — CORTISOL: Cortisol, Plasma: 48.4 ug/dL

## 2021-01-29 LAB — HIV ANTIBODY (ROUTINE TESTING W REFLEX): HIV Screen 4th Generation wRfx: NONREACTIVE

## 2021-01-29 LAB — LACTIC ACID, PLASMA: Lactic Acid, Venous: 3 mmol/L (ref 0.5–1.9)

## 2021-01-29 LAB — HEPARIN LEVEL (UNFRACTIONATED)
Heparin Unfractionated: >1.1 IU/mL — ABNORMAL HIGH (ref 0.30–0.70)
Heparin Unfractionated: >1.1 IU/mL — ABNORMAL HIGH (ref 0.30–0.70)

## 2021-01-29 LAB — RPR: RPR Ser Ql: NONREACTIVE

## 2021-01-29 MED ORDER — SODIUM CHLORIDE 0.9 % IV SOLN: 2.0000 g | INTRAVENOUS | Status: DC

## 2021-01-29 MED ORDER — HEPARIN (PORCINE) 25000 UT/250ML-% IV SOLN
1200.0000 [IU]/h | INTRAVENOUS | Status: DC
  Administered 2021-01-29: 1200 [IU]/h via INTRAVENOUS
  Filled 2021-01-29: qty 250

## 2021-01-29 MED ORDER — HEPARIN (PORCINE) 25000 UT/250ML-% IV SOLN
1000.0000 [IU]/h | INTRAVENOUS | Status: DC
  Administered 2021-01-29: 1000 [IU]/h via INTRAVENOUS

## 2021-01-29 NOTE — ED Notes (Signed)
Per family, pt wants to go home and family states they will honor his request. MD and SW notified. Tissues and drinks offered to family.

## 2021-01-29 NOTE — Consult Note (Signed)
Pharmacy Antibiotic Note  Charles Flores is a 87 y.o. male admitted on 01/28/2021 with shortness of breath/hypoxia. Patient with PMH of CKD and prostate cancer. Pharmacy has been consulted for vancomycin and cefepime dosing for sepsis.   Patient appears to have AKI on CKD (Scr 2.20 >> 2)  --baseline Scr appears to be  ~ 1.4-1.5  Plan:  Adjust cefepime to 2 g IV q24h  Patient received vancomycin 1.5 g LD --Dosing per levels given un-stable renal function --Vancomycin random level ordered for tomorrow AM --Daily Scr per protocol   Height: 5\' 11"  (180.3 cm) Weight: 74 kg (163 lb 2.3 oz) IBW/kg (Calculated) : 75.3  Temp (24hrs), Avg:96.1 F (35.6 C), Min:94.9 F (34.9 C), Max:97.3 F (36.3 C)  Recent Labs  Lab 01/28/21 1540 01/28/21 2044 01/28/21 2309 02/05/2021 0223 01/25/2021 0450  WBC 9.1  --   --   --  13.6*  CREATININE 2.20*  --   --   --  2.00*  LATICACIDVEN 9.1* 3.8* 2.8* 3.0*  --      Estimated Creatinine Clearance: 24.7 mL/min (A) (by C-G formula based on SCr of 2 mg/dL (H)).    No Known Allergies  Antimicrobials this admission: 6/10 cefepime >>  6/10 vancomycin >>   Dose adjustments this admission: N/a  Microbiology results: 6/10 BCx: NGTD 6/10 UCx: Pending 6/10 MRSA PCR: Negative  Thank you for allowing pharmacy to be a part of this patient's care.  Benita Gutter 02/11/2021 7:40 AM

## 2021-01-29 NOTE — ED Notes (Signed)
Patient is resting comfortably. Patient updated on pending transport home.

## 2021-01-29 NOTE — ED Notes (Signed)
Secretary asked to page Case Management per MD, Kurtis Bushman request.

## 2021-01-29 NOTE — Consult Note (Addendum)
ANTICOAGULATION CONSULT NOTE   Pharmacy Consult for Heparin infusion Indication: PE  No Known Allergies  Patient Measurements: Height: 5\' 11"  (180.3 cm) Weight: 74 kg (163 lb 2.3 oz) IBW/kg (Calculated) : 75.3 Heparin Dosing Weight: 74 kg   Vital Signs: Temp: 97.3 F (36.3 C) (06/11 0434) Temp Source: Axillary (06/11 0434) BP: 97/63 (06/11 1205) Pulse Rate: 74 (06/11 1315)  Labs: Recent Labs    01/28/21 1540 01/28/21 2044 01/28/21 2309 01/19/2021 0223 02/06/2021 0450 01/31/2021 1255  HGB 9.2*  --   --   --  8.9*  --   HCT 27.5*  --   --   --  26.3*  --   PLT 252  --   --   --  255  --   LABPROT 14.0  --   --   --   --   --   INR 1.1  --   --   --   --   --   HEPARINUNFRC  --   --   --  >1.10*  --  >1.10*  CREATININE 2.20*  --   --   --  2.00*  --   TROPONINIHS 531*   < > 637* 771* 748*  --    < > = values in this interval not displayed.     Estimated Creatinine Clearance: 24.7 mL/min (A) (by C-G formula based on SCr of 2 mg/dL (H)).   Medical History: Past Medical History:  Diagnosis Date   Anemia 12/24/2014   Arthritis    B12 deficiency anemia 01/01/2015   Cataract    Coronary artery disease    GERD (gastroesophageal reflux disease)    HIV (human immunodeficiency virus infection) ([place]) 01/28/2021   Hyperlipidemia    Hyperlipidemia    Prostate cancer ([place])    Renal insufficiency 04/15/2017   Seizure disorder ([place]) 01/28/2021   Thyroid nodule 03/14/2016    Medications:  No prior anticoagulation noted  Assessment: 87 y.o. male presented with SOB. Troponin 531. No prior anticoagulation noted. Pharmacy has been consulted for initiation and management of heparin infusion for concern for Pulmonary Embolism  6/11 0223 HL > 1.10 6/11 1255 HL > 1.10   Goal of Therapy:  Heparin level 0.3-0.7 units/ml Monitor platelets by anticoagulation protocol: Yes   Plan:  Heparin level is supratherapeutic. Will hold heparin infusion for 2 hours and restart at a rate of 1000  units/hr. Recheck heparin level in 8 hours. CBC daily while on heparin.   Oswald Hillock, PharmD, BCPS Clinical Pharmacist   02/11/2021,1:39 PM

## 2021-01-29 NOTE — ED Notes (Signed)
resp bedside, bipap removed and placed on high flow

## 2021-01-29 NOTE — ED Notes (Signed)
SW states transport has been arranged and papers for EMS transport will be brought to ED. Secretary notified.

## 2021-01-29 NOTE — ED Notes (Signed)
Transport at bedside. Patient cannot be transported home at this time per transport r/t no physical copy of DNR form at bedside. Nephew contacted to supply DNR form.

## 2021-01-29 NOTE — ED Notes (Signed)
RT called, asked to adjust mask and increase FiO2 per MD, Kurtis Bushman request.

## 2021-01-29 NOTE — ED Notes (Signed)
Family in room with pt- state they spoke to MD but have not decided to switch to comfort care at this time, are waiting for other family members to speak with/ visit pt.

## 2021-01-29 NOTE — ED Notes (Addendum)
Social worker Caryl Pina, state they will come to see room 2 shortly.

## 2021-01-29 NOTE — ED Notes (Signed)
Heparin paused per pharmacy instructions at this time.

## 2021-01-29 NOTE — ED Notes (Addendum)
Hospitalist, Amery at bedside.

## 2021-01-29 NOTE — Consult Note (Signed)
ANTICOAGULATION CONSULT NOTE - Initial Consult  Pharmacy Consult for Heparin infusion Indication: PE  No Known Allergies  Patient Measurements: Height: 5\' 11"  (180.3 cm) Weight: 74 kg (163 lb 2.3 oz) IBW/kg (Calculated) : 75.3 Heparin Dosing Weight: 74 kg   Vital Signs: Temp: 94.9 F (34.9 C) (06/10 1720) Temp Source: Rectal (06/10 1720) BP: 106/68 (06/11 0230) Pulse Rate: 85 (06/11 0230)  Labs: Recent Labs    01/28/21 1540 01/28/21 2044 01/28/21 2309 01/22/2021 0223  HGB 9.2*  --   --   --   HCT 27.5*  --   --   --   PLT 252  --   --   --   LABPROT 14.0  --   --   --   INR 1.1  --   --   --   HEPARINUNFRC  --   --   --  >1.10*  CREATININE 2.20*  --   --   --   TROPONINIHS 531* 669* 637* 771*     Estimated Creatinine Clearance: 22.4 mL/min (A) (by C-G formula based on SCr of 2.2 mg/dL (H)).   Medical History: Past Medical History:  Diagnosis Date   Anemia 12/24/2014   Arthritis    B12 deficiency anemia 01/01/2015   Cataract    Coronary artery disease    GERD (gastroesophageal reflux disease)    HIV (human immunodeficiency virus infection) ([place]) 01/28/2021   Hyperlipidemia    Hyperlipidemia    Prostate cancer ([place])    Renal insufficiency 04/15/2017   Seizure disorder ([place]) 01/28/2021   Thyroid nodule 03/14/2016    Medications:  No prior anticoagulation noted  Assessment: 87 y.o. male presented with SOB. Troponin 531. No prior anticoagulation noted. Pharmacy has been consulted for initiation and management of heparin infusion for concern for Pulmonary Embolism  Goal of Therapy:  Heparin level 0.3-0.7 units/ml Monitor platelets by anticoagulation protocol: Yes   Plan:  6/11: HL @ 0223 = > 1.10  ED RN confirmed that she drew sample from opposite arm as heparin infusion.  Will hold heparin gtt for 1 hr and restart @ 1200 units/hr. Will recheck HL 8 hrs after restart.   Orene Desanctis, PharmD Clinical Pharmacist   01/27/2021,3:37 AM

## 2021-01-29 NOTE — ED Notes (Signed)
Patient resting. Patient continues to be on Bipap. Respirations even and unlabored. Patient awaiting transport home on hospice.

## 2021-01-29 NOTE — ED Notes (Signed)
RT at bedside; mask changed.  Per RT, FiO2 already at 100%.

## 2021-01-29 NOTE — ED Notes (Signed)
Messaged dr patel and dr Kurtis Bushman of abg result and oxygen requirements

## 2021-01-29 NOTE — TOC Initial Note (Signed)
Transition of Care [facility]) - Initial/Assessment Note    Patient Details  Name: Charles Flores MRN: [id number] Date of Birth: 01-Aug-1927  Transition of Care [facility]) CM/SW Contact:    Alberteen Sam, LCSW Phone Number: 01/27/2021, 9:54 AM  Clinical Narrative:                  CSW spoke with patient's nephew Elberta Fortis, in agreement with [facility] and reports would need hospital bed.   Elberta Fortis expresses concerns that patient will not make it in transport to home, reports they would not want patient transported home if it would be uncomfortable for him, as his comfort is Anthony's priority right now. CSW informed Elberta Fortis if patient was stable to transport home we can call [facility] for safe transport.   CSW spoke with Dawn with [facility] home [facility] at [phone] she reports she will be calling family to arrange home hospice services.   Expected Discharge Plan: [place]     Patient Goals and CMS Choice   CMS Medicare.gov Compare Post Acute Care list provided to:: Patient Represenative (must comment) (nephew Elberta Fortis)    Expected Discharge Plan and Services Expected Discharge Plan: [place]                                              Prior Living Arrangements/Services                       Activities of Daily Living      Permission Sought/Granted                  Emotional Assessment              Admission diagnosis:  Acute respiratory failure with hypoxia ([place]) [J96.01] Patient Active Problem List   Diagnosis Date Noted   HIV (human immunodeficiency virus infection) ([place]) 01/28/2021   Seizure disorder ([place]) 01/28/2021   Hypovolemic shock ([place]) 01/28/2021   Acute respiratory failure with hypoxia ([place]) 01/28/2021   Adult failure to thrive 01/28/2021   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   Elevated troponin 01/28/2021   Autonomic dysfunction 01/10/2021   B12 deficiency 07/28/2020   Anemia due to stage 3a chronic  kidney disease ([place]) 07/28/2020   Idiopathic hypotension 01/18/2018   Near syncope 01/18/2018   SOB (shortness of breath) 01/18/2018   Pulmonary nodule 12/06/2017   Encounter for antineoplastic chemotherapy 10/01/2017   Renal insufficiency 04/15/2017   Thyroid nodule 03/14/2016   Weight loss 02/24/2016   B12 deficiency anemia 01/01/2015   Prostate cancer ([place]) 12/24/2014   Anemia 12/24/2014   PCP:  Lavera Guise, MD Pharmacy:   [id number] [place], [place] [place] [address] Phone: [phone] Fax: [phone]  [place] [address] Phone: [phone] Fax: [phone]     Social Determinants of Health (SDOH) Interventions    Readmission Risk Interventions No flowsheet data found.

## 2021-01-29 NOTE — ED Notes (Signed)
Heparin restarted at 1000 units/hr at this time.

## 2021-01-29 NOTE — ED Notes (Signed)
INTs removed. Catheters intact. Peri care performed, new brief applied. Patient placed on NRB at 15LPM for transport home with hospice. Elberta Fortis, patient's nephew notified of patient transport home.

## 2021-01-29 NOTE — Progress Notes (Signed)
*  PRELIMINARY RESULTS* Echocardiogram 2D Echocardiogram has been performed.  Charles Flores 02/12/2021, 10:13 AM

## 2021-01-29 NOTE — Discharge Summary (Signed)
Charles Flores FBP:[id number] DOB: Apr 13, 1927 DOA: 01/28/2021  PCP: Lavera Guise, MD  Admit date: 01/28/2021 Discharge date: 02/08/2021  Admitted From: home Disposition:  home with hospice /comfort measures at home   Home Health:hospice    Discharge Condition:Stable CODE STATUS:DNR  Diet recommendation: as tolerated   Brief/Interim Summary: Per HPI: Charles Flores is a 87 y.o. male seen in ed with complaints of shortness of breath for the past several days.  EMS was called patient was noted to be hypoxic with O2 sats in the 70s on room air.  Patient is not dyspneic and in no apparent distress with no accessory use of muscles of respiration.  She also denies any chest pain palpitations.  Patient does report not feeling well for the past few days as well.  Per nurse note patient's vitals initially were blood pressure of 70 over 40s, O2 of 70%, on arrival patient was on 15 L nonrebreather, currently on BiPAP.  In the emergency room patient was seen by pulmonologist intensivist and per report CODE STATUS has been requested to be DO NOT RESUSCITATE and DO NOT INTUBATE.  Patient did require Levophed vasopressor therapy and is currently on IV fluids, IV antibiotics and heparin drip for presumed pulmonary embolism treatment for hypoxia and respiratory failure. This am patient on bipap, confused, not very responsive except tries to open eyes. Family at bedside have decided to take patient home with hospice as his prognosis is not good. They were agreeable to discontinue VQ scan.  Discharge Diagnoses:  Principal Problem:   Acute respiratory failure with hypoxia (HCC) Active Problems:   Anemia   Renal insufficiency   Autonomic dysfunction   Hypovolemic shock (HCC)   Elevated troponin    Discharge Instructions  Discharge Instructions     Diet - low sodium heart healthy   Complete by: As directed    Diet - low sodium heart healthy   Complete by: As directed    Increase activity slowly   Complete  by: As directed       Allergies as of 02/06/2021   No Known Allergies      Medication List     STOP taking these medications    abiraterone acetate 250 MG tablet Commonly known as: ZYTIGA   carbamide peroxide 6.5 % OTIC solution Commonly known as: DEBROX   meclizine 12.5 MG tablet Commonly known as: ANTIVERT   midodrine 2.5 MG tablet Commonly known as: PROAMATINE   omeprazole 20 MG capsule Commonly known as: PRILOSEC   predniSONE 5 MG tablet Commonly known as: DELTASONE        No Known Allergies  Consultations:    Procedures/Studies: DG Chest Portable 1 View  Result Date: 01/28/2021 CLINICAL DATA:  Hypoxia EXAM: PORTABLE CHEST 1 VIEW COMPARISON:  11/27/2020 FINDINGS: Patient is rotated. New likely pleuroparenchymal density at the right lung base. New patchy density is present in the peripheral mid right lung. No pneumothorax. Similar cardiomediastinal contours. IMPRESSION: Patchy atelectasis/consolidation in the peripheral mid right lung. Probable atelectasis and small effusion at the right lung base. Electronically Signed   By: Macy Mis M.D.   On: 01/28/2021 16:10   ECHOCARDIOGRAM COMPLETE  Result Date: 02/14/2021    ECHOCARDIOGRAM REPORT   Patient Name:   Charles Flores Date of Exam: 02/05/2021 Medical Rec #:  [id number]   Height:       71.0 in Accession #:    [id number]  Weight:       163.1 lb Date of Birth:  18-Jan-1927  BSA:          1.934 m Patient Age:    49 years    BP:           107/94 mmHg Patient Gender: M           HR:           75 bpm. Exam Location:  [facility] Procedure: 2D Echo Indications:     Chest Pain R07.9  History:         Patient has prior history of Echocardiogram examinations, most                  recent 02/13/2018.  Sonographer:     Kathlen Brunswick Referring Phys:  [id number] EAVW U PATEL Diagnosing Phys: Kate Sable MD  Sonographer Comments: Technically challenging study due to limited acoustic windows. Image acquisition challenging due to  uncooperative patient. IMPRESSIONS  1. Left ventricular ejection fraction, by estimation, is 60 to 65%. The left ventricle has normal function. The left ventricle has no regional wall motion abnormalities. There is mild left ventricular hypertrophy. Left ventricular diastolic function could not be evaluated.  2. Right ventricular systolic function is mildly reduced. The right ventricular size is not well visualized.  3. Left atrial size was mildly dilated.  4. The mitral valve is grossly normal. No evidence of mitral valve regurgitation.  5. Tricuspid valve regurgitation is mild to moderate.  6. The aortic valve is tricuspid. Aortic valve regurgitation is not visualized. Mild aortic valve sclerosis is present, with no evidence of aortic valve stenosis.  7. The inferior vena cava is dilated in size with <50% respiratory variability, suggesting right atrial pressure of 15 mmHg. FINDINGS  Left Ventricle: Left ventricular ejection fraction, by estimation, is 60 to 65%. The left ventricle has normal function. The left ventricle has no regional wall motion abnormalities. The left ventricular internal cavity size was normal in size. There is  mild left ventricular hypertrophy. Left ventricular diastolic function could not be evaluated. Right Ventricle: The right ventricular size is not well visualized. No increase in right ventricular wall thickness. Right ventricular systolic function is mildly reduced. Left Atrium: Left atrial size was mildly dilated. Right Atrium: Right atrial size was not well visualized. Pericardium: There is no evidence of pericardial effusion. Mitral Valve: The mitral valve is grossly normal. No evidence of mitral valve regurgitation. Tricuspid Valve: The tricuspid valve is normal in structure. Tricuspid valve regurgitation is mild to moderate. Aortic Valve: The aortic valve is tricuspid. Aortic valve regurgitation is not visualized. Mild aortic valve sclerosis is present, with no evidence of aortic  valve stenosis. Pulmonic Valve: The pulmonic valve was normal in structure. Pulmonic valve regurgitation is trivial. Aorta: The aortic root is normal in size and structure. Venous: The inferior vena cava is dilated in size with less than 50% respiratory variability, suggesting right atrial pressure of 15 mmHg. IAS/Shunts: No atrial level shunt detected by color flow Doppler.  LEFT VENTRICLE PLAX 2D LVIDd:         2.78 cm LVIDs:         1.95 cm LV PW:         1.30 cm LV IVS:        1.38 cm  LEFT ATRIUM         Index LA diam:    4.10 cm 2.12 cm/m  PULMONIC VALVE PV Vmax:       0.65 m/s PV Peak grad:  1.7 mmHg  TRICUSPID VALVE TV Peak  grad:   28.5 mmHg TV Vmax:        2.67 m/s Kate Sable MD Electronically signed by Kate Sable MD Signature Date/Time: 02/02/2021/12:01:34 PM    Final       Subjective: Confused, in mittents on cipap  Discharge Exam: Vitals:   02/17/2021 1145 02/03/2021 1205  BP:  97/63  Pulse:  94  Resp:  18  Temp:    SpO2: 94%    Vitals:   02/01/2021 1110 02/01/2021 1125 01/20/2021 1145 01/31/2021 1205  BP:  97/67  97/63  Pulse: 72 75  94  Resp: 19 20  18   Temp:      TempSrc:      SpO2: 95% 91% 94%   Weight:      Height:        General: eyes closed, on bipap, in mittens Cardiovascular: RRR, S1/S2 +, nno gallop Respiratory: poor respiratory effort, decrease bs Abdominal: Soft, NT, ND, bowel sounds + Extremities: no edema    The results of significant diagnostics from this hospitalization (including imaging, microbiology, ancillary and laboratory) are listed below for reference.     Microbiology: Recent Results (from the past 240 hour(s))  Resp Panel by RT-PCR (Flu A&B, Covid) Nasopharyngeal Swab     Status: None   Collection Time: 01/28/21  3:40 PM   Specimen: Nasopharyngeal Swab; Nasopharyngeal(NP) swabs in vial transport medium  Result Value Ref Range Status   SARS Coronavirus 2 by RT PCR NEGATIVE NEGATIVE Final    Comment: (NOTE) SARS-CoV-2 target nucleic  acids are NOT DETECTED.  The SARS-CoV-2 RNA is generally detectable in upper respiratory specimens during the acute phase of infection. The lowest concentration of SARS-CoV-2 viral copies this assay can detect is 138 copies/mL. A negative result does not preclude SARS-Cov-2 infection and should not be used as the sole basis for treatment or other patient management decisions. A negative result may occur with  improper specimen collection/handling, submission of specimen other than nasopharyngeal swab, presence of viral mutation(s) within the areas targeted by this assay, and inadequate number of viral copies(<138 copies/mL). A negative result must be combined with clinical observations, patient history, and epidemiological information. The expected result is Negative.  Fact Sheet for Patients:  EntrepreneurPulse.com.au  Fact Sheet for Healthcare Providers:  IncredibleEmployment.be  This test is no t yet approved or cleared by the [place] FDA and  has been authorized for detection and/or diagnosis of SARS-CoV-2 by FDA under an Emergency Use Authorization (EUA). This EUA will remain  in effect (meaning this test can be used) for the duration of the COVID-19 declaration under Section 564(b)(1) of the Act, 21 U.S.C.section 360bbb-3(b)(1), unless the authorization is terminated  or revoked sooner.       Influenza A by PCR NEGATIVE NEGATIVE Final   Influenza B by PCR NEGATIVE NEGATIVE Final    Comment: (NOTE) The Xpert Xpress SARS-CoV-2/FLU/RSV plus assay is intended as an aid in the diagnosis of influenza from Nasopharyngeal swab specimens and should not be used as a sole basis for treatment. Nasal washings and aspirates are unacceptable for Xpert Xpress SARS-CoV-2/FLU/RSV testing.  Fact Sheet for Patients: EntrepreneurPulse.com.au  Fact Sheet for Healthcare Providers: IncredibleEmployment.be  This  test is not yet approved or cleared by the [place] FDA and has been authorized for detection and/or diagnosis of SARS-CoV-2 by FDA under an Emergency Use Authorization (EUA). This EUA will remain in effect (meaning this test can be used) for the duration of the COVID-19 declaration under Section 564(b)(1)  of the Act, 21 U.S.C. section 360bbb-3(b)(1), unless the authorization is terminated or revoked.  Performed at [facility], [address] [place], [place] [address]   Culture, blood (routine x 2)     Status: None (Preliminary result)   Collection Time: 01/28/21  3:40 PM   Specimen: BLOOD  Result Value Ref Range Status   Specimen Description BLOOD BLOOD RIGHT FOREARM  Final   Special Requests   Final    BOTTLES DRAWN AEROBIC AND ANAEROBIC Blood Culture adequate volume   Culture   Final    NO GROWTH < 24 HOURS Performed at [facility], [address], [place], [place] [address]    Report Status PENDING  Incomplete  Culture, blood (routine x 2)     Status: None (Preliminary result)   Collection Time: 01/28/21  3:40 PM   Specimen: BLOOD  Result Value Ref Range Status   Specimen Description BLOOD BLOOD LEFT FOREARM  Final   Special Requests   Final    BOTTLES DRAWN AEROBIC AND ANAEROBIC Blood Culture adequate volume   Culture   Final    NO GROWTH < 24 HOURS Performed at [facility], [address], [place], [place] [address]    Report Status PENDING  Incomplete  MRSA PCR Screening     Status: None   Collection Time: 01/28/21  8:44 PM   Specimen: Nasopharyngeal  Result Value Ref Range Status   MRSA by PCR NEGATIVE NEGATIVE Final    Comment:        The GeneXpert MRSA Assay (FDA approved for NASAL specimens only), is one component of a comprehensive MRSA colonization surveillance program. It is not intended to diagnose MRSA infection nor to guide or monitor treatment for MRSA infections. Performed at [facility], [address] [place], [place] [address]      Labs: BNP (last 3 results) Recent Labs    01/28/21 1540  BNP 7,672.0*   Basic Metabolic Panel: Recent Labs  Lab 01/28/21 1540 02/06/2021 0450  NA 136 139  K 5.3* 5.2*  CL 103 110  CO2 15* 20*  GLUCOSE 111* 107*  BUN 47* 52*  CREATININE 2.20* 2.00*  CALCIUM 8.8* 8.6*   Liver Function Tests: Recent Labs  Lab 01/28/21 1540  AST 55*  ALT 30  ALKPHOS 88  BILITOT 0.8  PROT 6.4*  ALBUMIN 3.2*   No results for input(s): LIPASE, AMYLASE in the last 168 hours. No results for input(s): AMMONIA in the last 168 hours. CBC: Recent Labs  Lab 01/28/21 1540 01/26/2021 0450  WBC 9.1 13.6*  NEUTROABS 7.5  --   HGB 9.2* 8.9*  HCT 27.5* 26.3*  MCV 103.0* 101.9*  PLT 252 255   Cardiac Enzymes: No results for input(s): CKTOTAL, CKMB, CKMBINDEX, TROPONINI in the last 168 hours. BNP: Invalid input(s): POCBNP CBG: No results for input(s): GLUCAP in the last 168 hours. D-Dimer No results for input(s): DDIMER in the last 72 hours. Hgb A1c No results for input(s): HGBA1C in the last 72 hours. Lipid Profile No results for input(s): CHOL, HDL, LDLCALC, TRIG, CHOLHDL, LDLDIRECT in the last 72 hours. Thyroid function studies Recent Labs    01/28/21 2044  TSH 12.071*   Anemia work up No results for input(s): VITAMINB12, FOLATE, FERRITIN, TIBC, IRON, RETICCTPCT in the last 72 hours. Urinalysis    Component Value Date/Time   COLORURINE AMBER (A) 01/28/2021 0247   APPEARANCEUR HAZY (A) 01/28/2021 0247   APPEARANCEUR Cloudy (A) 11/12/2019 0000  LABSPEC 1.020 01/28/2021 0247   PHURINE 5.0 01/28/2021 0247   GLUCOSEU NEGATIVE 01/28/2021 0247   HGBUR SMALL (A) 01/28/2021 0247   BILIRUBINUR NEGATIVE 01/28/2021 0247   BILIRUBINUR Negative 11/12/2019 0000   KETONESUR NEGATIVE 01/28/2021 0247   PROTEINUR 30 (A) 01/28/2021 0247   NITRITE NEGATIVE 01/28/2021 0247   LEUKOCYTESUR NEGATIVE 01/28/2021 0247   Sepsis Labs Invalid input(s):  PROCALCITONIN,  WBC,  LACTICIDVEN Microbiology Recent Results (from the past 240 hour(s))  Resp Panel by RT-PCR (Flu A&B, Covid) Nasopharyngeal Swab     Status: None   Collection Time: 01/28/21  3:40 PM   Specimen: Nasopharyngeal Swab; Nasopharyngeal(NP) swabs in vial transport medium  Result Value Ref Range Status   SARS Coronavirus 2 by RT PCR NEGATIVE NEGATIVE Final    Comment: (NOTE) SARS-CoV-2 target nucleic acids are NOT DETECTED.  The SARS-CoV-2 RNA is generally detectable in upper respiratory specimens during the acute phase of infection. The lowest concentration of SARS-CoV-2 viral copies this assay can detect is 138 copies/mL. A negative result does not preclude SARS-Cov-2 infection and should not be used as the sole basis for treatment or other patient management decisions. A negative result may occur with  improper specimen collection/handling, submission of specimen other than nasopharyngeal swab, presence of viral mutation(s) within the areas targeted by this assay, and inadequate number of viral copies(<138 copies/mL). A negative result must be combined with clinical observations, patient history, and epidemiological information. The expected result is Negative.  Fact Sheet for Patients:  EntrepreneurPulse.com.au  Fact Sheet for Healthcare Providers:  IncredibleEmployment.be  This test is no t yet approved or cleared by the [place] FDA and  has been authorized for detection and/or diagnosis of SARS-CoV-2 by FDA under an Emergency Use Authorization (EUA). This EUA will remain  in effect (meaning this test can be used) for the duration of the COVID-19 declaration under Section 564(b)(1) of the Act, 21 U.S.C.section 360bbb-3(b)(1), unless the authorization is terminated  or revoked sooner.       Influenza A by PCR NEGATIVE NEGATIVE Final   Influenza B by PCR NEGATIVE NEGATIVE Final    Comment: (NOTE) The Xpert Xpress  SARS-CoV-2/FLU/RSV plus assay is intended as an aid in the diagnosis of influenza from Nasopharyngeal swab specimens and should not be used as a sole basis for treatment. Nasal washings and aspirates are unacceptable for Xpert Xpress SARS-CoV-2/FLU/RSV testing.  Fact Sheet for Patients: EntrepreneurPulse.com.au  Fact Sheet for Healthcare Providers: IncredibleEmployment.be  This test is not yet approved or cleared by the [place] FDA and has been authorized for detection and/or diagnosis of SARS-CoV-2 by FDA under an Emergency Use Authorization (EUA). This EUA will remain in effect (meaning this test can be used) for the duration of the COVID-19 declaration under Section 564(b)(1) of the Act, 21 U.S.C. section 360bbb-3(b)(1), unless the authorization is terminated or revoked.  Performed at [facility], [address] [place], [place] [address]   Culture, blood (routine x 2)     Status: None (Preliminary result)   Collection Time: 01/28/21  3:40 PM   Specimen: BLOOD  Result Value Ref Range Status   Specimen Description BLOOD BLOOD RIGHT FOREARM  Final   Special Requests   Final    BOTTLES DRAWN AEROBIC AND ANAEROBIC Blood Culture adequate volume   Culture   Final    NO GROWTH < 24 HOURS Performed at [facility], [address], [place], [place] [address]    Report Status PENDING  Incomplete  Culture, blood (routine x 2)     Status: None (Preliminary result)   Collection Time: 01/28/21  3:40 PM   Specimen: BLOOD  Result Value Ref Range Status   Specimen Description BLOOD BLOOD LEFT FOREARM  Final   Special Requests   Final    BOTTLES DRAWN AEROBIC AND ANAEROBIC Blood Culture adequate volume   Culture   Final    NO GROWTH < 24 HOURS Performed at [facility], [address], [place], [place] [address]    Report Status PENDING  Incomplete  MRSA PCR Screening     Status: None   Collection Time: 01/28/21   8:44 PM   Specimen: Nasopharyngeal  Result Value Ref Range Status   MRSA by PCR NEGATIVE NEGATIVE Final    Comment:        The GeneXpert MRSA Assay (FDA approved for NASAL specimens only), is one component of a comprehensive MRSA colonization surveillance program. It is not intended to diagnose MRSA infection nor to guide or monitor treatment for MRSA infections. Performed at [facility], [address] [place], [place] [address]      Time coordinating discharge: Over 30 minutes  SIGNED:   Nolberto Hanlon, MD  [facility] 02/09/2021, 12:41 PM Pager   If 7PM-7AM, please contact night-coverage [email or web address] Password [email or web address]

## 2021-01-29 NOTE — ED Notes (Addendum)
Discovered pt removed iv in L hand, bleeding controlled, dressing placed, readjusted bipap mask and reoriented pt. Other iv sites wrapped in coban, fresh sheets provided

## 2021-01-29 NOTE — ED Notes (Addendum)
Attempted to contact pt's nephew Elberta Fortis and niece Alden Benjamin at [phone] and [phone] without success, no family with pt at this time.

## 2021-01-29 NOTE — Progress Notes (Signed)
   02/05/2021 1023  Clinical Encounter Type  Visited With Patient and family together  Visit Type Initial  Referral From Nurse  Consult/Referral To Chaplain  Spiritual Encounters  Spiritual Needs Prayer;Emotional  Chaplain Autumn Patty responded to a page in ED-2 Pt, Mr. Charles Flores. Charles Flores is on bipap, confused, not very responsive except tries to open eyes. Family at bedside have decided to take patient home with hospice as his prognosis is not good. They were agreeable to discontinue VQ scan. Charles Flores discharge plan is home with [facility]. I provided spiritual and emotional support. I also use reflective listening and provided words of encouragement to Charles Flores and his loved ones.

## 2021-01-29 NOTE — Progress Notes (Signed)
Cross coverage note  Patient with critical PO2 on ABG of 41.  Has been pulling CPAP mask off due to poor tolerance.  pH and PCO2 were normal.  We will try high flow nasal cannula

## 2021-01-29 NOTE — ED Notes (Signed)
Pt had removed BiPAP upon assumption of care. Mask replaced and mitts applied for pt safety.

## 2021-01-29 NOTE — TOC Transition Note (Signed)
Transition of Care [facility]) - CM/SW Discharge Note   Patient Details  Name: Saivon Prowse MRN: [id number] Date of Birth: 24-May-1927  Transition of Care [facility]) CM/SW Contact:  Alberteen Sam, LCSW Phone Number: 02/03/2021, 3:56 PM   Clinical Narrative:     Patient will DC to: Home with [facility] date:01/28/2021 Family notified:[facility] TG:[id number]  Per MD patient ready for DC to home with hospice. RN, patient, patient's family, and facility notified of DC. [facility] has set up hospital bed and oxygen at patient's home.  Ambulance transport requested for patient. [facility] aware. CSW signing off.  Pricilla Riffle, LCSW    Final next level of care: Home w Hospice Care Barriers to Discharge: No Barriers Identified   Patient Goals and CMS Choice   CMS Medicare.gov Compare Post Acute Care list provided to:: Patient Represenative (must comment) (nephew Elberta Fortis)    Discharge Placement                Patient to be transferred to facility by: [facility] Name of family member notified: Elberta Fortis Patient and family notified of of transfer: 02/07/2021  Discharge Plan and Services                DME Arranged: Oxygen, Hospital bed   Date DME Agency Contacted: 02/16/2021 Time DME Agency Contacted: [phone]              Social Determinants of Health (SDOH) Interventions     Readmission Risk Interventions No flowsheet data found.

## 2021-01-29 NOTE — ED Notes (Signed)
Pt still pulling at bipap, readjusted and reoriented

## 2021-01-30 LAB — URINE CULTURE: Culture: NO GROWTH

## 2021-02-01 ENCOUNTER — Ambulatory Visit: Payer: Medicare Other

## 2021-02-01 ENCOUNTER — Ambulatory Visit: Payer: Medicare Other | Admitting: Oncology

## 2021-02-01 ENCOUNTER — Encounter: Payer: Medicare Other | Admitting: Hospice and Palliative Medicine

## 2021-02-01 ENCOUNTER — Other Ambulatory Visit: Payer: Medicare Other

## 2021-02-01 LAB — BLOOD GAS, ARTERIAL
Acid-base deficit: 8.6 mmol/L — ABNORMAL HIGH (ref 0.0–2.0)
Bicarbonate: 15.1 mmol/L — ABNORMAL LOW (ref 20.0–28.0)
O2 Saturation: 74.3 %
Patient temperature: 37
pCO2 arterial: 25 mmHg — ABNORMAL LOW (ref 32.0–48.0)
pH, Arterial: 7.39 (ref 7.350–7.450)
pO2, Arterial: 40 mmHg — CL (ref 83.0–108.0)

## 2021-02-02 LAB — CULTURE, BLOOD (ROUTINE X 2)
Culture: NO GROWTH
Culture: NO GROWTH
Special Requests: ADEQUATE
Special Requests: ADEQUATE

## 2021-02-18 DEATH — deceased

## 2021-02-23 ENCOUNTER — Other Ambulatory Visit: Payer: Self-pay | Admitting: Internal Medicine

## 2021-02-23 DIAGNOSIS — I950 Idiopathic hypotension: Secondary | ICD-10-CM

## 2021-02-23 DIAGNOSIS — I951 Orthostatic hypotension: Secondary | ICD-10-CM

## 2021-03-28 ENCOUNTER — Other Ambulatory Visit (HOSPITAL_COMMUNITY): Payer: Self-pay

## 2021-09-01 ENCOUNTER — Other Ambulatory Visit (HOSPITAL_COMMUNITY): Payer: Self-pay

## 2021-12-08 ENCOUNTER — Ambulatory Visit: Payer: Medicare Other | Admitting: Physician Assistant

## 2022-03-13 IMAGING — DX DG CHEST 1V PORT
1 series · 1 of 1 positions shown · non-contrast
Comparison: 11/27/2020

CLINICAL DATA: Hypoxia

EXAM:
PORTABLE CHEST 1 VIEW

[chest ap]
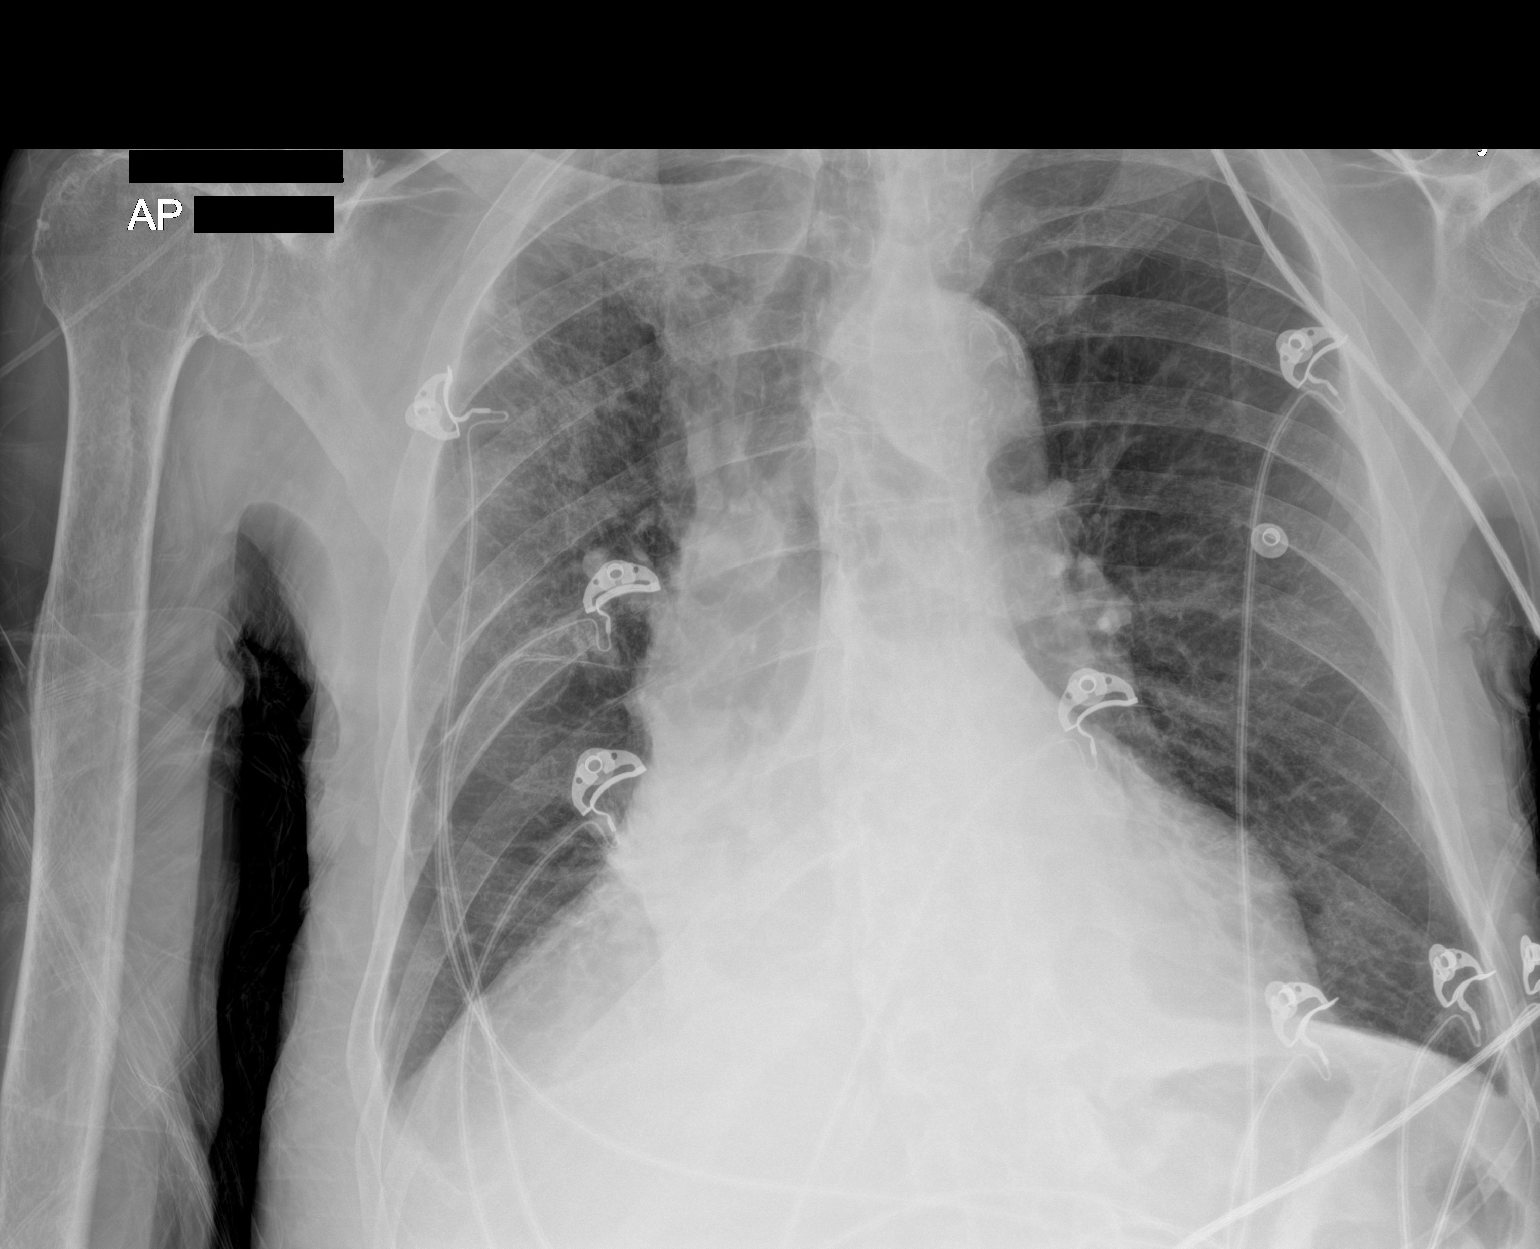

[1 of 1 positions shown; findings below may reference images not displayed]

FINDINGS: Patient is rotated. New likely pleuroparenchymal density at the
right lung base. New patchy density is present in the peripheral mid
right lung. No pneumothorax. Similar cardiomediastinal contours.
IMPRESSION: Patchy atelectasis/consolidation in the peripheral mid right lung.
Probable atelectasis and small effusion at the right lung base.
